# Patient Record
Sex: Female | Born: 1940 | Race: White | Hispanic: No | Marital: Married | Smoking: Never smoker
Health system: Southern US, Community
[De-identification: ages and names within clinical notes are randomized; demographics above are authoritative.]

## PROBLEM LIST (undated history)

## (undated) DIAGNOSIS — F410 Panic disorder [episodic paroxysmal anxiety] without agoraphobia: Secondary | ICD-10-CM

## (undated) DIAGNOSIS — K5901 Slow transit constipation: Secondary | ICD-10-CM

## (undated) DIAGNOSIS — G5600 Carpal tunnel syndrome, unspecified upper limb: Secondary | ICD-10-CM

## (undated) DIAGNOSIS — E785 Hyperlipidemia, unspecified: Secondary | ICD-10-CM

## (undated) DIAGNOSIS — K589 Irritable bowel syndrome without diarrhea: Secondary | ICD-10-CM

## (undated) DIAGNOSIS — K529 Noninfective gastroenteritis and colitis, unspecified: Secondary | ICD-10-CM

## (undated) DIAGNOSIS — H25019 Cortical age-related cataract, unspecified eye: Secondary | ICD-10-CM

## (undated) DIAGNOSIS — Q438 Other specified congenital malformations of intestine: Secondary | ICD-10-CM

## (undated) DIAGNOSIS — Z8719 Personal history of other diseases of the digestive system: Secondary | ICD-10-CM

## (undated) DIAGNOSIS — T8859XA Other complications of anesthesia, initial encounter: Secondary | ICD-10-CM

## (undated) DIAGNOSIS — F32A Depression, unspecified: Secondary | ICD-10-CM

## (undated) DIAGNOSIS — G629 Polyneuropathy, unspecified: Secondary | ICD-10-CM

## (undated) DIAGNOSIS — M810 Age-related osteoporosis without current pathological fracture: Secondary | ICD-10-CM

## (undated) DIAGNOSIS — L719 Rosacea, unspecified: Secondary | ICD-10-CM

## (undated) DIAGNOSIS — L409 Psoriasis, unspecified: Secondary | ICD-10-CM

## (undated) DIAGNOSIS — F4310 Post-traumatic stress disorder, unspecified: Secondary | ICD-10-CM

## (undated) DIAGNOSIS — H209 Unspecified iridocyclitis: Secondary | ICD-10-CM

## (undated) DIAGNOSIS — I10 Essential (primary) hypertension: Secondary | ICD-10-CM

## (undated) DIAGNOSIS — G4730 Sleep apnea, unspecified: Secondary | ICD-10-CM

## (undated) DIAGNOSIS — Z87898 Personal history of other specified conditions: Secondary | ICD-10-CM

## (undated) DIAGNOSIS — R1310 Dysphagia, unspecified: Secondary | ICD-10-CM

## (undated) DIAGNOSIS — F419 Anxiety disorder, unspecified: Secondary | ICD-10-CM

## (undated) DIAGNOSIS — G4733 Obstructive sleep apnea (adult) (pediatric): Secondary | ICD-10-CM

## (undated) DIAGNOSIS — T4145XA Adverse effect of unspecified anesthetic, initial encounter: Secondary | ICD-10-CM

## (undated) DIAGNOSIS — K649 Unspecified hemorrhoids: Secondary | ICD-10-CM

## (undated) DIAGNOSIS — M479 Spondylosis, unspecified: Secondary | ICD-10-CM

## (undated) DIAGNOSIS — G9520 Unspecified cord compression: Secondary | ICD-10-CM

## (undated) DIAGNOSIS — R42 Dizziness and giddiness: Secondary | ICD-10-CM

## (undated) DIAGNOSIS — M1990 Unspecified osteoarthritis, unspecified site: Secondary | ICD-10-CM

## (undated) DIAGNOSIS — K219 Gastro-esophageal reflux disease without esophagitis: Secondary | ICD-10-CM

## (undated) DIAGNOSIS — E739 Lactose intolerance, unspecified: Secondary | ICD-10-CM

## (undated) DIAGNOSIS — G4752 REM sleep behavior disorder: Secondary | ICD-10-CM

## (undated) DIAGNOSIS — F429 Obsessive-compulsive disorder, unspecified: Secondary | ICD-10-CM

## (undated) DIAGNOSIS — F329 Major depressive disorder, single episode, unspecified: Secondary | ICD-10-CM

## (undated) DIAGNOSIS — M2020 Hallux rigidus, unspecified foot: Secondary | ICD-10-CM

## (undated) DIAGNOSIS — Z9889 Other specified postprocedural states: Secondary | ICD-10-CM

## (undated) DIAGNOSIS — I779 Disorder of arteries and arterioles, unspecified: Secondary | ICD-10-CM

## (undated) DIAGNOSIS — B019 Varicella without complication: Secondary | ICD-10-CM

## (undated) DIAGNOSIS — G43909 Migraine, unspecified, not intractable, without status migrainosus: Secondary | ICD-10-CM

## (undated) DIAGNOSIS — D649 Anemia, unspecified: Secondary | ICD-10-CM

## (undated) DIAGNOSIS — R112 Nausea with vomiting, unspecified: Secondary | ICD-10-CM

## (undated) DIAGNOSIS — M47812 Spondylosis without myelopathy or radiculopathy, cervical region: Secondary | ICD-10-CM

## (undated) DIAGNOSIS — G479 Sleep disorder, unspecified: Secondary | ICD-10-CM

## (undated) HISTORY — PX: COLONOSCOPY: SHX174

## (undated) HISTORY — DX: Depression, unspecified: F32.A

## (undated) HISTORY — PX: ESOPHAGOGASTRODUODENOSCOPY: SHX1529

## (undated) HISTORY — PX: EYE SURGERY: SHX253

## (undated) HISTORY — PX: FOOT FUSION: SHX956

## (undated) HISTORY — DX: Noninfective gastroenteritis and colitis, unspecified: K52.9

## (undated) HISTORY — DX: Unspecified cord compression: G95.20

## (undated) HISTORY — PX: TONSILLECTOMY: SUR1361

## (undated) HISTORY — DX: Anxiety disorder, unspecified: F41.9

## (undated) HISTORY — PX: SPINE SURGERY: SHX786

## (undated) HISTORY — PX: BACK SURGERY: SHX140

## (undated) HISTORY — DX: Major depressive disorder, single episode, unspecified: F32.9

## (undated) HISTORY — PX: HAND SURGERY: SHX662

## (undated) HISTORY — PX: ABDOMINAL HYSTERECTOMY: SHX81

---

## 2000-08-07 DIAGNOSIS — I639 Cerebral infarction, unspecified: Secondary | ICD-10-CM

## 2000-08-07 HISTORY — DX: Cerebral infarction, unspecified: I63.9

## 2005-11-07 HISTORY — PX: OTHER SURGICAL HISTORY: SHX169

## 2006-05-18 ENCOUNTER — Ambulatory Visit: Payer: Self-pay | Admitting: Family Medicine

## 2006-09-22 ENCOUNTER — Ambulatory Visit: Payer: Self-pay | Admitting: Ophthalmology

## 2007-05-23 ENCOUNTER — Ambulatory Visit: Payer: Self-pay | Admitting: Family Medicine

## 2007-06-08 ENCOUNTER — Ambulatory Visit: Payer: Self-pay | Admitting: Unknown Physician Specialty

## 2007-09-12 ENCOUNTER — Ambulatory Visit: Payer: Self-pay | Admitting: Rheumatology

## 2007-09-20 ENCOUNTER — Ambulatory Visit: Payer: Self-pay | Admitting: Rheumatology

## 2007-10-15 ENCOUNTER — Ambulatory Visit: Payer: Self-pay | Admitting: Pain Medicine

## 2007-10-24 ENCOUNTER — Ambulatory Visit: Payer: Self-pay | Admitting: Pain Medicine

## 2007-12-04 ENCOUNTER — Ambulatory Visit: Payer: Self-pay | Admitting: Pain Medicine

## 2007-12-10 ENCOUNTER — Ambulatory Visit: Payer: Self-pay | Admitting: Pain Medicine

## 2008-01-08 ENCOUNTER — Ambulatory Visit: Payer: Self-pay | Admitting: Pain Medicine

## 2008-06-18 ENCOUNTER — Ambulatory Visit: Payer: Self-pay | Admitting: Family Medicine

## 2008-11-20 ENCOUNTER — Ambulatory Visit: Payer: Self-pay | Admitting: Unknown Physician Specialty

## 2008-11-26 ENCOUNTER — Ambulatory Visit: Payer: Self-pay | Admitting: Unknown Physician Specialty

## 2009-06-23 ENCOUNTER — Ambulatory Visit: Payer: Self-pay | Admitting: Family Medicine

## 2009-10-07 ENCOUNTER — Ambulatory Visit: Payer: Self-pay | Admitting: Gastroenterology

## 2009-12-16 ENCOUNTER — Ambulatory Visit: Payer: Self-pay | Admitting: Ophthalmology

## 2009-12-29 ENCOUNTER — Ambulatory Visit: Payer: Self-pay | Admitting: Ophthalmology

## 2010-08-12 ENCOUNTER — Ambulatory Visit: Payer: Self-pay | Admitting: Family Medicine

## 2010-11-09 DIAGNOSIS — R413 Other amnesia: Secondary | ICD-10-CM | POA: Insufficient documentation

## 2011-05-25 ENCOUNTER — Ambulatory Visit: Payer: Self-pay | Admitting: Urology

## 2011-11-28 ENCOUNTER — Ambulatory Visit: Payer: Self-pay | Admitting: Family Medicine

## 2012-05-30 ENCOUNTER — Ambulatory Visit: Payer: Self-pay | Admitting: Urology

## 2012-09-28 ENCOUNTER — Ambulatory Visit: Payer: Self-pay | Admitting: Neurology

## 2012-09-28 LAB — CREATININE, SERUM
EGFR (African American): >60
EGFR (Non-African Amer.): >60

## 2012-11-28 DIAGNOSIS — G9520 Unspecified cord compression: Secondary | ICD-10-CM | POA: Insufficient documentation

## 2012-11-29 ENCOUNTER — Ambulatory Visit: Payer: Self-pay | Admitting: Family Medicine

## 2013-01-28 ENCOUNTER — Ambulatory Visit: Payer: Self-pay | Admitting: Unknown Physician Specialty

## 2013-05-27 ENCOUNTER — Ambulatory Visit: Payer: Self-pay | Admitting: Urology

## 2013-06-28 DIAGNOSIS — R350 Frequency of micturition: Secondary | ICD-10-CM | POA: Insufficient documentation

## 2013-06-28 DIAGNOSIS — N952 Postmenopausal atrophic vaginitis: Secondary | ICD-10-CM | POA: Insufficient documentation

## 2013-06-28 DIAGNOSIS — N399 Disorder of urinary system, unspecified: Secondary | ICD-10-CM | POA: Insufficient documentation

## 2013-06-28 DIAGNOSIS — N3946 Mixed incontinence: Secondary | ICD-10-CM | POA: Insufficient documentation

## 2013-06-28 DIAGNOSIS — D419 Neoplasm of uncertain behavior of unspecified urinary organ: Secondary | ICD-10-CM | POA: Insufficient documentation

## 2013-12-20 ENCOUNTER — Ambulatory Visit: Payer: Self-pay | Admitting: Family Medicine

## 2014-03-04 ENCOUNTER — Ambulatory Visit: Payer: Self-pay | Admitting: Neurology

## 2014-03-09 ENCOUNTER — Emergency Department: Payer: Self-pay | Admitting: Emergency Medicine

## 2014-03-09 LAB — BASIC METABOLIC PANEL
Anion Gap: 9 (ref 7–16)
BUN: 9 mg/dL (ref 7–18)
CHLORIDE: 96 mmol/L — AB (ref 98–107)
CO2: 26 mmol/L (ref 21–32)
Calcium, Total: 9 mg/dL (ref 8.5–10.1)
Creatinine: 0.54 mg/dL — ABNORMAL LOW (ref 0.60–1.30)
GLUCOSE: 115 mg/dL — AB (ref 65–99)
Osmolality: 262 (ref 275–301)
POTASSIUM: 3.5 mmol/L (ref 3.5–5.1)
Sodium: 131 mmol/L — ABNORMAL LOW (ref 136–145)

## 2014-03-09 LAB — CBC
HCT: 32.3 % — ABNORMAL LOW (ref 35.0–47.0)
HGB: 11.1 g/dL — ABNORMAL LOW (ref 12.0–16.0)
MCH: 30.9 pg (ref 26.0–34.0)
MCHC: 34.3 g/dL (ref 32.0–36.0)
MCV: 90 fL (ref 80–100)
Platelet: 202 10*3/uL (ref 150–440)
RBC: 3.58 10*6/uL — ABNORMAL LOW (ref 3.80–5.20)
RDW: 14.3 % (ref 11.5–14.5)
WBC: 8.8 10*3/uL (ref 3.6–11.0)

## 2014-03-14 DIAGNOSIS — R519 Headache, unspecified: Secondary | ICD-10-CM | POA: Insufficient documentation

## 2014-03-14 DIAGNOSIS — E785 Hyperlipidemia, unspecified: Secondary | ICD-10-CM | POA: Insufficient documentation

## 2014-05-14 DIAGNOSIS — M810 Age-related osteoporosis without current pathological fracture: Secondary | ICD-10-CM | POA: Insufficient documentation

## 2014-05-14 DIAGNOSIS — F32A Depression, unspecified: Secondary | ICD-10-CM | POA: Insufficient documentation

## 2014-05-14 DIAGNOSIS — F419 Anxiety disorder, unspecified: Secondary | ICD-10-CM | POA: Insufficient documentation

## 2014-05-14 DIAGNOSIS — F332 Major depressive disorder, recurrent severe without psychotic features: Secondary | ICD-10-CM | POA: Insufficient documentation

## 2014-05-14 DIAGNOSIS — K219 Gastro-esophageal reflux disease without esophagitis: Secondary | ICD-10-CM | POA: Insufficient documentation

## 2014-07-25 DIAGNOSIS — M1990 Unspecified osteoarthritis, unspecified site: Secondary | ICD-10-CM | POA: Insufficient documentation

## 2014-10-14 ENCOUNTER — Inpatient Hospital Stay: Payer: Self-pay | Admitting: Internal Medicine

## 2014-10-14 LAB — COMPREHENSIVE METABOLIC PANEL
ALT: 21 U/L
ANION GAP: 6 — AB (ref 7–16)
AST: 30 U/L (ref 15–37)
Albumin: 3.7 g/dL (ref 3.4–5.0)
Alkaline Phosphatase: 69 U/L
BILIRUBIN TOTAL: 1.1 mg/dL — AB (ref 0.2–1.0)
BUN: 8 mg/dL (ref 7–18)
CO2: 28 mmol/L (ref 21–32)
Calcium, Total: 8.6 mg/dL (ref 8.5–10.1)
Chloride: 99 mmol/L (ref 98–107)
Creatinine: 0.66 mg/dL (ref 0.60–1.30)
Glucose: 108 mg/dL — ABNORMAL HIGH (ref 65–99)
OSMOLALITY: 265 (ref 275–301)
POTASSIUM: 3.6 mmol/L (ref 3.5–5.1)
SODIUM: 133 mmol/L — AB (ref 136–145)
TOTAL PROTEIN: 6.8 g/dL (ref 6.4–8.2)

## 2014-10-14 LAB — CBC WITH DIFFERENTIAL/PLATELET
Basophil #: 0.1 10*3/uL (ref 0.0–0.1)
Basophil %: 0.6 %
EOS ABS: 0.1 10*3/uL (ref 0.0–0.7)
Eosinophil %: 0.4 %
HCT: 34 % — ABNORMAL LOW (ref 35.0–47.0)
HGB: 11.6 g/dL — AB (ref 12.0–16.0)
LYMPHS ABS: 2.1 10*3/uL (ref 1.0–3.6)
Lymphocyte %: 11.7 %
MCH: 30.7 pg (ref 26.0–34.0)
MCHC: 34 g/dL (ref 32.0–36.0)
MCV: 90 fL (ref 80–100)
MONOS PCT: 5.7 %
Monocyte #: 1 x10 3/mm — ABNORMAL HIGH (ref 0.2–0.9)
NEUTROS PCT: 81.6 %
Neutrophil #: 14.8 10*3/uL — ABNORMAL HIGH (ref 1.4–6.5)
PLATELETS: 214 10*3/uL (ref 150–440)
RBC: 3.77 10*6/uL — AB (ref 3.80–5.20)
RDW: 13.7 % (ref 11.5–14.5)
WBC: 18.2 10*3/uL — ABNORMAL HIGH (ref 3.6–11.0)

## 2014-10-14 LAB — URINALYSIS, COMPLETE
BILIRUBIN, UR: NEGATIVE
Blood: NEGATIVE
Glucose,UR: NEGATIVE mg/dL (ref 0–75)
Nitrite: NEGATIVE
Ph: 6 (ref 4.5–8.0)
Protein: 100
Specific Gravity: 1.023 (ref 1.003–1.030)
Squamous Epithelial: 3
Transitional Epi: 1
WBC UR: 18 /HPF (ref 0–5)

## 2014-10-14 LAB — LIPASE, BLOOD: Lipase: 91 U/L (ref 73–393)

## 2014-10-14 LAB — TROPONIN I

## 2014-10-15 LAB — BASIC METABOLIC PANEL
ANION GAP: 7 (ref 7–16)
BUN: 4 mg/dL — AB (ref 7–18)
Calcium, Total: 8 mg/dL — ABNORMAL LOW (ref 8.5–10.1)
Chloride: 106 mmol/L (ref 98–107)
Co2: 26 mmol/L (ref 21–32)
Creatinine: 0.69 mg/dL (ref 0.60–1.30)
Glucose: 99 mg/dL (ref 65–99)
Osmolality: 274 (ref 275–301)
POTASSIUM: 3.3 mmol/L — AB (ref 3.5–5.1)
Sodium: 139 mmol/L (ref 136–145)

## 2014-10-15 LAB — CBC WITH DIFFERENTIAL/PLATELET
BASOS ABS: 0.1 10*3/uL (ref 0.0–0.1)
Basophil %: 0.7 %
Eosinophil #: 0.2 10*3/uL (ref 0.0–0.7)
Eosinophil %: 1.9 %
HCT: 30.7 % — ABNORMAL LOW (ref 35.0–47.0)
HGB: 10.5 g/dL — AB (ref 12.0–16.0)
Lymphocyte #: 2 10*3/uL (ref 1.0–3.6)
Lymphocyte %: 16.6 %
MCH: 30.8 pg (ref 26.0–34.0)
MCHC: 34.3 g/dL (ref 32.0–36.0)
MCV: 90 fL (ref 80–100)
MONOS PCT: 8 %
Monocyte #: 1 x10 3/mm — ABNORMAL HIGH (ref 0.2–0.9)
NEUTROS ABS: 8.8 10*3/uL — AB (ref 1.4–6.5)
NEUTROS PCT: 72.8 %
PLATELETS: 188 10*3/uL (ref 150–440)
RBC: 3.42 10*6/uL — ABNORMAL LOW (ref 3.80–5.20)
RDW: 13.8 % (ref 11.5–14.5)
WBC: 12.1 10*3/uL — ABNORMAL HIGH (ref 3.6–11.0)

## 2014-10-15 LAB — CLOSTRIDIUM DIFFICILE(ARMC)

## 2014-10-16 LAB — URINE CULTURE

## 2014-10-17 ENCOUNTER — Ambulatory Visit: Payer: Self-pay | Admitting: Podiatry

## 2014-10-17 LAB — STOOL CULTURE

## 2014-10-19 LAB — CULTURE, BLOOD (SINGLE)

## 2014-10-29 ENCOUNTER — Encounter: Payer: Self-pay | Admitting: *Deleted

## 2014-10-29 ENCOUNTER — Ambulatory Visit (INDEPENDENT_AMBULATORY_CARE_PROVIDER_SITE_OTHER): Payer: Medicare Other | Admitting: Podiatry

## 2014-10-29 ENCOUNTER — Other Ambulatory Visit: Payer: Self-pay | Admitting: *Deleted

## 2014-10-29 ENCOUNTER — Encounter: Payer: Self-pay | Admitting: Podiatry

## 2014-10-29 VITALS — BP 108/63 | HR 89 | Resp 16 | Ht <= 58 in | Wt 130.0 lb

## 2014-10-29 DIAGNOSIS — B351 Tinea unguium: Secondary | ICD-10-CM

## 2014-10-29 DIAGNOSIS — M79606 Pain in leg, unspecified: Secondary | ICD-10-CM

## 2014-10-29 NOTE — Progress Notes (Signed)
   Subjective:    Patient ID: Sandra Mcconnell, female    DOB: 1941/05/15, 73 y.o.   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  Want to have my feet looked at .      Review of Systems  All other systems reviewed and are negative.      Objective:   Physical Exam: I have reviewed her past history medications out a surgery social history and review of systems area pulses are strongly palpable bilateral. Neurologic sensorium is intact percent once the monofilament. Deep tendon reflexes are intact bilateral muscle strength is 5 over 5 dorsiflexion plantar flexors and inverters and everters all intrinsic musculature is intact. Orthopedic evaluation shows all joints distal to the ankle for range of motion without crepitation with exception of the first metatarsophalangeal joints bilaterally which were fused back in 2004. Her nails are thick yellow dystrophic I do not see any signs of psoriasis. Nail dystrophy or onychomycosis would be the treatment. These are tender on palpation as well as debridement.        Assessment & Plan:  Assessment: Pain limb secondary to onychomycosis and nail dystrophy is bilateral.  Plan: Debridement of nails 1 through 5 bilateral cover service secondary to discomfort.

## 2014-12-22 ENCOUNTER — Ambulatory Visit: Payer: Self-pay | Admitting: Family Medicine

## 2015-01-19 ENCOUNTER — Ambulatory Visit: Payer: Medicare Other

## 2015-01-21 ENCOUNTER — Ambulatory Visit (INDEPENDENT_AMBULATORY_CARE_PROVIDER_SITE_OTHER): Payer: Medicare Other | Admitting: Podiatry

## 2015-01-21 DIAGNOSIS — B351 Tinea unguium: Secondary | ICD-10-CM

## 2015-01-21 DIAGNOSIS — M79606 Pain in leg, unspecified: Secondary | ICD-10-CM

## 2015-01-21 NOTE — Progress Notes (Signed)
She presents with a chief complaint of painful elongated toenails bilateral.  Objective: His are strongly palpable bilateral. Nails are thick yellow dystrophic with mycotic and painful palpation.  Assessment: Pain in limb secondary to onychomycosis 1 through 5 bilateral.  Plan: Debridement of nails 1 through 5 bilateral.

## 2015-02-20 DIAGNOSIS — G2581 Restless legs syndrome: Secondary | ICD-10-CM | POA: Insufficient documentation

## 2015-02-28 NOTE — H&P (Signed)
PATIENT NAME:  Sandra Mcconnell, Sandra Mcconnell MR#:  [id number] DATE OF BIRTH:  December 31, 1940  DATE OF ADMISSION:  10/14/2014  REFERRING EMERGENCY ROOM PHYSICIAN:  Dr. Benjaman Lobe.   PRIMARY CARE PHYSICIAN: Dr. Lovie Macadamia.    PRIMARY GASTROENTEROLOGIST:  Dr. Vira Agar.   CHIEF COMPLAINT: Abdominal pain and diarrhea.   HISTORY OF PRESENT ILLNESS: This very pleasant 74 year old female with past medical history of anxiety with panic, REM sleep disorder, recent right arm surgery and trigger finger release, presents today with 3 days of intense abdominal pain, chills, sweats, diarrhea, and vomiting. She reports that on Saturday she had acute onset of abdominal pain which felt like something twisting deep in her abdomen. Pain is 10 out of 10 at its worst. She then began to have profound watery diarrhea, no hematochezia or melena. She began vomiting repeatedly to the point of dry heaving. She was unable to sleep since that time. She reports that any ingestion of any food or fluid caused recurrence of the abdominal pain. This morning she decided she could not take it anymore and she was unable to hold anything down without vomiting and pain, so presented to the Emergency Room. CT scan shows colitis of the right and proximal transverse colon. She is admitted for further vital admission and treatment.   PAST MEDICAL HISTORY:  1.  Anxiety with panic.  2.  REM sleep disorder.  3.  Restless leg syndrome.  4.  Migraines.  5.  Gastroesophageal reflux disease.  6.  Poor balance requiring use of Mcconnell walker.  7.  Multiple C-spine surgeries requiring cervical brace (soft).     PAST SURGICAL HISTORY:  1.  Recent right arm trigger finger and carpal tunnel release surgery.  2.  Tonsillectomy.  3.  Hysterectomy.  4.  Cervical spine fusion x 2.   SOCIAL HISTORY: The patient lives with her husband at [facility]. She uses Mcconnell walker due to poor balance. She denies alcohol or illicit substance abuse. Does not smoke cigarettes.   FAMILY MEDICAL  HISTORY: Positive for coronary artery disease, stroke, diabetes in her mother and father. Mother also had breast cancer. Grandfather had colon cancer.   ALLERGIES: THE PATIENT IS ALLERGIC TO ABILIFY, REMERON, CIPROFLOXACIN, AND PENICILLIN.   HOME MEDICATIONS:  1. Zofran ODT 4 mg 1 tablet 3 times Mcconnell day as needed for nausea and vomiting.  2. Trazodone 50 mg 1 tablet 3 times Mcconnell day as needed for anxiety.  3. Tizanidine 4 mg 2 capsules 3 times Mcconnell day.  4. Sertraline 100 mg 1 tablet daily.  5. Ropinirole 0.5 mg 2 tablets once Mcconnell day at bedtime.  6. Raloxifene 60 mg 1 tablet once Mcconnell day.  7. Polyethylene glycol 17 grams 1-2 times Mcconnell day as needed for constipation.  8. Omeprazole 20 mg 1 tablet once Mcconnell day.  9. Norco 325-5 mg 1 tablet every 6 hours as needed for pain.  10. Ipratropium nasal spray 2 sprays to each nostril 3 times Mcconnell day.  11. Fortical 200 international units inhalation 1 spray nasally once Mcconnell day.  12. Fluticasone propionate 50 mcg nasally once Mcconnell day.  13. Etodolac 400 mg 1 tablet twice Mcconnell day.  14. Clonazepam 1 mg 1.5 tablets once Mcconnell day at bedtime.  15. Betamethasone topical 0.05% topical ointment apply twice Mcconnell day.   REVIEW OF SYSTEMS:  CONSTITUTIONAL: Positive for subjective fevers, chills, weakness, and fatigue, as well as pain.  HEENT: No change in vision or hearing, no pain in eyes or ears, no  difficulty swallowing or sore throat.  RESPIRATORY: No cough, wheezing, shortness of breath, painful respirations.  CARDIOVASCULAR: No chest pain, orthopnea, edema, palpitations.  GASTROINTESTINAL: Positive as above for nausea, vomiting, diarrhea, and abdominal pain. No hematemesis or melena.  GENITOURINARY: Negative for dysuria or frequency.  MUSCULOSKELETAL: No joint effusions, limited range of motion, no new pain in the neck, back, shoulders, knees, or hips.  NEUROLOGIC: No focal numbness or weakness, no CVA, seizure, or dementia, does have Mcconnell history of sleep disorder, headaches, and  restless leg syndrome.  PSYCHIATRIC: Positive for anxiety and depression which seem controlled at this time.   PHYSICAL EXAMINATION:  VITAL SIGNS: Temperature 98.2, pulse 82, respirations 18, blood pressure 130/66, oxygenation 100% on room air.  GENERAL: No acute distress.  HEENT: Pupils equal, round, and reactive to light, conjunctivae clear, extraocular motion intact, oral mucous membranes are pink and moist, she has upper and lower dentures, she does have Mcconnell shallow aphthous type ulcer on the right posterior palate, no other oral lesions, posterior oropharynx is clear with no exudate, edema, or erythema, trachea is midline, no cervical lymphadenopathy, thyroid nontender.   PULMONARY: Lungs clear to auscultation bilaterally with good air movement.  CARDIOVASCULAR: Regular rate and rhythm, no murmurs, rubs, or gallops, no peripheral edema, peripheral pulses are 2 +.  ABDOMEN: Distended, soft, tender diffusely but worse in the right mid and upper quadrants, there is guarding, no rebound, no organomegaly.  MUSCULOSKELETAL: No joint effusions, she does have Mcconnell cast on the right arm from the elbow to the mid fingers, she also wears Mcconnell soft neck collar. Range of motion normal, strength is 5 out of 5 throughout.  NEUROLOGIC: Cranial nerves II through XII grossly intact. Strength and sensation intact, nonfocal.  PSYCHIATRIC: The patient does have Mcconnell history of severe anxiety, depression, and sleep disorder, but these seem to be controlled at this time.   LABORATORY DATA: Sodium 133, potassium 3.6, chloride 99, bicarbonate 28, BUN 8, creatinine 0.66, glucose 108. LFTs normal. White blood cells 18.2, hemoglobin 11.6, platelets 214,000, MCV 90.  UA positive with 18 white blood cells, 3 red blood cells.   IMAGING: CT scan of the abdomen shows severe wall thickening of the right and proximal transverse colon noted consistent with colitis. Most likely it is inflammatory or infectious in origin, but ischemic colitis  cannot be excluded. No pneumatosis is seen.  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   ASSESSMENT AND PLAN:   1.  Colitis: The patient reports that she has Mcconnell long history of GI issues though these are not well defined. Her last colonoscopy was in March of 2004 by Dr. Vira Agar. At that time she had hemorrhoids, no other findings. Differential at this point include infectious, inflammatory, or possibly ischemic colitis. We will check Clostridium difficile as she has received antibiotics prior to her surgery. We will check stool cultures. Flagyl and Bactrim IV have been started in the Emergency Room (she is allergic to penicillin and ciprofloxacin). She does not have any hematochezia or melena. If symptoms are not significantly improved in the morning after antibiotics would get Mcconnell GI consult by Dr. Percell [place] group for further evaluation.  2.  Urinary tract infection: We will send urine for culture. IV Bactrim would cover most urinary tract pathogens.  3.  Sleep disorder: The patient reports REM sleep disorder with behavioral disturbance. Her husband will spend the night with her tonight in the hospital to help  with this. She will resume her home medications at this time.  4.  Anxiety disorder with panic: The patient seems calm at this time. Will resume home medications.  5.  Recent right hand surgery: We will have pain medication available. At this time pain is controlled. She has good range of motion of her fingers. Seems to be healing nicely. 6.  Gastroesophageal reflux disease: Continue PPI.  7.  Prophylaxis: Will hold off on pharmacological DVT prophylaxis in the setting of acute colitis as do not want to encourage any bleeding. Will have PPI for GI prophylaxis.   TIME SPENT ON ADMISSION: 45 minutes.    ____________________________ Earleen Newport. Volanda Napoleon, MD cpw:bu D: 10/14/2014 19:16:00 ET T: 10/14/2014 19:55:59  ET JOB#: [id number]  cc: Barnetta Chapel P. Volanda Napoleon, MD, <Dictator> Aldean Jewett MD ELECTRONICALLY SIGNED 10/29/2014 9:07

## 2015-02-28 NOTE — Discharge Summary (Signed)
PATIENT NAME:  Sandra Mcconnell, Sandra Mcconnell MR#:  [id number] DATE OF BIRTH:  08-Apr-1941  DATE OF ADMISSION:  10/14/2014 DATE OF DISCHARGE:  10/16/2014  DISCHARGE DIAGNOSES:  1.  Acute transverse colitis.  2.  Gastroesophageal reflux disease.  3.  Chronic dysphagia.  4.  Irritable bowel syndrome.   DISCHARGE MEDICATIONS:  1.  Omeprazole 20 mg daily.  2.  Fortical 200 international units nasal spray once Mcconnell day.  3.  Fluticasone nasal spray once.  4.  MiraLax 17 grams 1 to 2 times Mcconnell day as needed for constipation.  5.  Ipratropium 2 sprays nasal 3 times Mcconnell day as needed.  6.  Norco 325/5 one tablet oral every 6 hours as needed for pain.  7.  Raloxifene 60 mg oral once Mcconnell day.  8.  Tizanidine 4 mg 2 capsules oral 3 times Mcconnell day.  9.  Clonazepam 1.5 tablets oral once Mcconnell day.  10. Ropinirole 0.5 mg 2 tablets once Mcconnell day at bedtime.  11. Etodolac 400 mg oral 2 times Mcconnell day as needed for headache.  12. Sertraline 100 mg daily.  13. Trazodone 50 mg oral 3 times Mcconnell day as needed for anxiety and nervousness.  14. Zofran ODT 4 mg oral 3 times Mcconnell day as needed.  15. Flagyl 500 mg oral every 8 hours.  16. Bactrim DS 1 tablet oral 2 times Mcconnell day.   DISCHARGE INSTRUCTIONS: Mechanical soft diet. Activity as tolerated. Follow up with Dr. Vira Agar of GI in 2 to 4 weeks for colitis and dysphagia.   IMAGING STUDIES: Include Mcconnell CT scan of the abdomen and pelvis with contrast which showed severe wall thickening of the right and proximal transverse colon with colitis. No pneumatosis is seen. No abscess or diverticulitis.   ADMITTING HISTORY AND PHYSICAL AND HOSPITAL COURSE: Please see the detailed H and P dictated by Dr. Volanda Napoleon. This is Mcconnell 18-old Caucasian female patient who was brought in to the hospital complaining of abdominal pain, diarrhea, nausea and vomiting. The patient was found to have colitis and was admitted to the hospitalist service. The patient was started on antibiotics of Flagyl and Bactrim secondary to her allergy  profile. The patient improved well by the day of discharge. The abdominal pain is much improved. She has tolerated regular food. No nausea or vomiting. No blood in the stool. Afebrile. White count has trended down close to normal, and she is being discharged home on antibiotics to finish Mcconnell total of 10-day course. The patient will follow up with Dr. Vira Agar for her colitis and also her chronic dysphagia.   PHYSICAL EXAMINATION:  ABDOMEN:  Prior to discharge, the patient has mild tenderness in the lower abdomen. No rigidity or guarding. Bowel sounds present.  LUNGS: Sound clear.  HEART: S1, S2 heard.   TIME SPENT ON DAY OF DISCHARGE IN DISCHARGE ACTIVITY: 35 minutes.    ____________________________ Leia Alf Devin Ganaway, MD srs:JT D: 10/17/2014 12:37:00 ET T: 10/17/2014 20:46:04 ET JOB#: [id number]  cc: Alveta Heimlich R. Darvin Neighbours, MD, <Dictator> Manya Silvas, MD Youlanda Roys. Lovie Macadamia, MD Neita Carp MD ELECTRONICALLY SIGNED 10/23/2014 13:08

## 2015-04-27 ENCOUNTER — Ambulatory Visit: Payer: Medicare Other

## 2015-09-06 ENCOUNTER — Emergency Department
Admission: EM | Admit: 2015-09-06 | Discharge: 2015-09-06 | Disposition: A | Discharge status: Home / Self Care | Payer: Medicare Other | Attending: Emergency Medicine | Admitting: Emergency Medicine

## 2015-09-06 ENCOUNTER — Emergency Department: Payer: Medicare Other

## 2015-09-06 ENCOUNTER — Encounter: Payer: Self-pay | Admitting: Radiology

## 2015-09-06 DIAGNOSIS — Z79899 Other long term (current) drug therapy: Secondary | ICD-10-CM | POA: Insufficient documentation

## 2015-09-06 DIAGNOSIS — Z880 Allergy status to penicillin: Secondary | ICD-10-CM | POA: Diagnosis not present

## 2015-09-06 DIAGNOSIS — R1031 Right lower quadrant pain: Secondary | ICD-10-CM | POA: Insufficient documentation

## 2015-09-06 DIAGNOSIS — M545 Low back pain, unspecified: Secondary | ICD-10-CM

## 2015-09-06 DIAGNOSIS — R200 Anesthesia of skin: Secondary | ICD-10-CM | POA: Insufficient documentation

## 2015-09-06 DIAGNOSIS — G8929 Other chronic pain: Secondary | ICD-10-CM | POA: Insufficient documentation

## 2015-09-06 DIAGNOSIS — R531 Weakness: Secondary | ICD-10-CM | POA: Insufficient documentation

## 2015-09-06 LAB — URINALYSIS COMPLETE WITH MICROSCOPIC (ARMC ONLY)
Bacteria, UA: NONE SEEN
Bilirubin Urine: NEGATIVE
Glucose, UA: NEGATIVE mg/dL
Hgb urine dipstick: NEGATIVE
Ketones, ur: NEGATIVE mg/dL
Leukocytes, UA: NEGATIVE
Nitrite: NEGATIVE
Protein, ur: NEGATIVE mg/dL
RBC / HPF: NONE SEEN RBC/hpf (ref 0–5)
Specific Gravity, Urine: 1.02 (ref 1.005–1.030)
pH: 5 (ref 5.0–8.0)

## 2015-09-06 LAB — COMPREHENSIVE METABOLIC PANEL WITH GFR
ALT: 14 U/L (ref 14–54)
AST: 23 U/L (ref 15–41)
Albumin: 4 g/dL (ref 3.5–5.0)
Alkaline Phosphatase: 56 U/L (ref 38–126)
Anion gap: 6 (ref 5–15)
BUN: 14 mg/dL (ref 6–20)
CO2: 31 mmol/L (ref 22–32)
Calcium: 8.9 mg/dL (ref 8.9–10.3)
Chloride: 102 mmol/L (ref 101–111)
Creatinine, Ser: 0.68 mg/dL (ref 0.44–1.00)
GFR calc Af Amer: >60 mL/min (ref >60)
GFR calc non Af Amer: >60 mL/min (ref >60)
Glucose, Bld: 100 mg/dL — ABNORMAL HIGH (ref 65–99)
Potassium: 4.1 mmol/L (ref 3.5–5.1)
Sodium: 139 mmol/L (ref 135–145)
Total Bilirubin: 0.6 mg/dL (ref 0.3–1.2)
Total Protein: 6.5 g/dL (ref 6.5–8.1)

## 2015-09-06 LAB — CBC WITH DIFFERENTIAL/PLATELET
Basophils Absolute: 0 K/uL (ref 0–0.1)
Basophils Relative: 0 %
Eosinophils Absolute: 0.1 K/uL (ref 0–0.7)
Eosinophils Relative: 1 %
HCT: 30.4 % — ABNORMAL LOW (ref 35.0–47.0)
Hemoglobin: 10.6 g/dL — ABNORMAL LOW (ref 12.0–16.0)
Lymphocytes Relative: 17 %
Lymphs Abs: 1.6 K/uL (ref 1.0–3.6)
MCH: 31.8 pg (ref 26.0–34.0)
MCHC: 34.8 g/dL (ref 32.0–36.0)
MCV: 91.4 fL (ref 80.0–100.0)
Monocytes Absolute: 1 K/uL — ABNORMAL HIGH (ref 0.2–0.9)
Monocytes Relative: 10 %
Neutro Abs: 6.7 K/uL — ABNORMAL HIGH (ref 1.4–6.5)
Neutrophils Relative %: 72 %
Platelets: 180 K/uL (ref 150–440)
RBC: 3.32 MIL/uL — ABNORMAL LOW (ref 3.80–5.20)
RDW: 13.4 % (ref 11.5–14.5)
WBC: 9.4 K/uL (ref 3.6–11.0)

## 2015-09-06 LAB — LIPASE, BLOOD: Lipase: 23 U/L (ref 11–51)

## 2015-09-06 MED ORDER — IOHEXOL 240 MG/ML SOLN
25.0000 mL | Freq: Once | INTRAMUSCULAR | Status: AC | PRN
  Administered 2015-09-06: 25 mL via ORAL

## 2015-09-06 MED ORDER — MORPHINE SULFATE (PF) 2 MG/ML IV SOLN
2.0000 mg | Freq: Once | INTRAVENOUS | Status: AC
  Administered 2015-09-06: 2 mg via INTRAVENOUS
  Filled 2015-09-06: qty 1

## 2015-09-06 MED ORDER — IOHEXOL 300 MG/ML  SOLN
100.0000 mL | Freq: Once | INTRAMUSCULAR | Status: AC | PRN
  Administered 2015-09-06: 100 mL via INTRAVENOUS

## 2015-09-06 NOTE — ED Provider Notes (Signed)
[facility] Emergency Department Provider Note  ____________________________________________  Time seen: 0745  I have reviewed the triage vital signs and the nursing notes.   HISTORY  Chief Complaint Back Pain; Extremity Weakness; and Numbness   History limited by: Not Limited   HPI Sandra Mcconnell is a 74 y.o. female with history of chronic back pain issues who presents because of worsening right flank pain. She states that it started about 10 days ago. It gradually is getting worse until about 4 days ago when it became more severe. It is a constant pain however does have episodes of very intense pain. She states it's worse when she moves around however it is still present even lying still. She states the pain starts in her right back that wraps around down towards her right groin. She states that her home oxycodone is not helping with this pain. She denies any fevers. She states that her right leg has given out a couple of times. She states is referred to support weight on her leg given the pain. She denies any urinary retention. No recent bowel incontinence although does have a history of occasional episodes of bowel incontinence.   Past Medical History  Diagnosis Date  . Cervical spinal cord compression   . Depression   . Anxiety   . Colitis     Patient Active Problem List   Diagnosis Date Noted  . 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 Urinary system disease 06/28/2013  . 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 FOM (frequency of micturition) 06/28/2013  . Cervical spinal cord compression ([place]) 11/28/2012  . Amnesia 11/09/2010    Past Surgical History  Procedure Laterality Date  . Hand surgery    . Spine surgery      Current Outpatient Rx  Name  Route  Sig  Dispense  Refill  . betamethasone dipropionate (DIPROLENE) 0.05 % ointment            6   . calcitonin, salmon, (MIACALCIN/FORTICAL) 200  UNIT/ACT nasal spray            3   . chlorhexidine (PERIDEX) 0.12 % solution            99   . clonazePAM (KLONOPIN) 0.5 MG tablet            1   . etodolac (LODINE) 400 MG tablet            1   . fluticasone (FLONASE) 50 MCG/ACT nasal spray            5   . FLUZONE HIGH-DOSE 0.5 ML SUSY            0   . HYDROcodone-acetaminophen (NORCO/VICODIN) 5-325 MG per tablet            0   . ipratropium (ATROVENT) 0.03 % nasal spray            5   . metroNIDAZOLE (FLAGYL) 500 MG tablet   Oral   Take 500 mg by mouth every 8 (eight) hours.      0   . omeprazole (PRILOSEC) 20 MG capsule            2   . ondansetron (ZOFRAN) 4 MG tablet               . ondansetron (ZOFRAN-ODT) 4 MG disintegrating tablet            0   .  polyethylene glycol powder (GLYCOLAX/MIRALAX) powder            0   . raloxifene (EVISTA) 60 MG tablet   Oral   Take 60 mg by mouth daily.      1   . rOPINIRole (REQUIP) 0.5 MG tablet   Oral   Take 1 mg by mouth at bedtime.      5   . sertraline (ZOLOFT) 100 MG tablet            2   . sulfamethoxazole-trimethoprim (BACTRIM DS,SEPTRA DS) 800-160 MG per tablet   Oral   Take 1 tablet by mouth 2 (two) times daily.      0   . tiZANidine (ZANAFLEX) 4 MG tablet               . traZODone (DESYREL) 50 MG tablet            2     Allergies Aripiprazole; Ciprofloxacin; Mirtazapine; and Penicillins  No family history on file.  Social History Social History  Substance Use Topics  . Smoking status: Never Smoker   . Smokeless tobacco: Not on file  . Alcohol Use: Not on file    Review of Systems  Constitutional: Negative for fever. Cardiovascular: Negative for chest pain. Respiratory: Negative for shortness of breath. Gastrointestinal: Positive for right lower quadrant pain Genitourinary: Negative for dysuria. Musculoskeletal: Positive for lower right back pain Skin: Negative for  rash. Neurological: Negative for headaches, focal weakness or numbness.   10-point ROS otherwise negative.  ____________________________________________   PHYSICAL EXAM:  VITAL SIGNS: ED Triage Vitals  Enc Vitals Group     BP 09/06/15 0604 96/41 mmHg     Pulse Rate 09/06/15 0604 77     Resp 09/06/15 0604 18     Temp 09/06/15 0604 98.5 F (36.9 C)     Temp Source 09/06/15 0604 Oral     SpO2 09/06/15 0604 99 %     Weight 09/06/15 0604 138 lb (62.596 kg)     Height 09/06/15 0604 4\' 10"  (1.473 m)     Head Cir --      Peak Flow --      Pain Score 09/06/15 0605 8   Constitutional: Alert and oriented. Well appearing and in no distress. Eyes: Conjunctivae are normal. PERRL. Normal extraocular movements. ENT   Head: Normocephalic and atraumatic.   Nose: No congestion/rhinnorhea.   Mouth/Throat: Mucous membranes are moist.   Neck: No stridor. Hematological/Lymphatic/Immunilogical: No cervical lymphadenopathy. Cardiovascular: Normal rate, regular rhythm.  No murmurs, rubs, or gallops. Respiratory: Normal respiratory effort without tachypnea nor retractions. Breath sounds are clear and equal bilaterally. No wheezes/rales/rhonchi. Gastrointestinal: Soft. Tender to palpation in the right side and right lower quadrant. Mild right-sided CVA tenderness. Genitourinary: Deferred Musculoskeletal: Normal range of motion in all extremities. No joint effusions.  No lower extremity tenderness nor edema. Neurologic:  Normal speech and language. No gross focal neurologic deficits are appreciated. Speech is normal.  Skin:  Skin is warm, dry and intact. No rash noted. Psychiatric: Mood and affect are normal. Speech and behavior are normal. Patient exhibits appropriate insight and judgment.  ____________________________________________    LABS (pertinent positives/negatives)  Labs Reviewed  CBC WITH DIFFERENTIAL/PLATELET - Abnormal; Notable for the following:    RBC 3.32 (*)     Hemoglobin 10.6 (*)    HCT 30.4 (*)    Neutro Abs 6.7 (*)    Monocytes Absolute 1.0 (*)    All other components  within normal limits  COMPREHENSIVE METABOLIC PANEL - Abnormal; Notable for the following:    Glucose, Bld 100 (*)    All other components within normal limits  URINALYSIS COMPLETEWITH MICROSCOPIC ([facility] ONLY) - Abnormal; Notable for the following:    Color, Urine YELLOW (*)    APPearance CLEAR (*)    Squamous Epithelial / LPF 0-5 (*)    All other components within normal limits  LIPASE, BLOOD     ____________________________________________   EKG  None  ____________________________________________    RADIOLOGY  CT abd/pel  IMPRESSION: 1. No acute findings in the abdomen or pelvis. 2. 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 3. Stable angiomyolipoma of the RIGHT kidney. 4. Degenerative change of the lumbar spine stable  I, Shonique Pelphrey, personally viewed and evaluated these images (plain radiographs) as part of my medical decision making. ____________________________________________   PROCEDURES  Procedure(s) performed: None  Critical Care performed: No  ____________________________________________   INITIAL IMPRESSION / ASSESSMENT AND PLAN / ED COURSE  Pertinent labs & imaging results that were available during my care of the patient were reviewed by me and considered in my medical decision making (see chart for details).  She presents to the emergency department today with concerns for right flank and low back and abdominal pain. A CT scan was performed given that she did have some tenderness in that area. CT scan without any obvious etiology of these findings. This point patient does have good strength bilaterally in lower extremities as well as sensation. Additionally no urinary retention or bowel incontinence. I think spinal cord compression is unlikely at this point. I think more likely this is an exacerbation  of her chronic pain issues. She is not taking her full potential dose of the narcotic pain medications I did discuss with the family that she should try to take her oxycodone every 6 hours as it is prescribed. Additionally I encourage patient to follow-up with her pain management doctors.  ____________________________________________   FINAL CLINICAL IMPRESSION(S) / ED DIAGNOSES  Final diagnoses:  Right-sided low back pain without sciatica     Nance Pear, MD 09/06/15 1340

## 2015-09-06 NOTE — Discharge Instructions (Signed)
As we discussed please take your pain medication every 6 hours. Please seek medical attention for any high fevers, chest pain, shortness of breath, inability to urinate, bowel incontinence, change in behavior, persistent vomiting, bloody stool or any other new or concerning symptoms.   Back Pain, Adult Back pain is very common in adults.The cause of back pain is rarely dangerous and the pain often gets better over time.The cause of your back pain may not be known. Some common causes of back pain include:  Strain of the muscles or ligaments supporting the spine.  Wear and tear (degeneration) of the spinal disks.  Arthritis.  Direct injury to the back. For many people, back pain may return. Since back pain is rarely dangerous, most people can learn to manage this condition on their own. HOME CARE INSTRUCTIONS Watch your back pain for any changes. The following actions may help to lessen any discomfort you are feeling:  Remain active. It is stressful on your back to sit or stand in one place for long periods of time. Do not sit, drive, or stand in one place for more than 30 minutes at a time. Take short walks on even surfaces as soon as you are able.Try to increase the length of time you walk each day.  Exercise regularly as directed by your health care provider. Exercise helps your back heal faster. It also helps avoid future injury by keeping your muscles strong and flexible.  Do not stay in bed.Resting more than 1-2 days can delay your recovery.  Pay attention to your body when you bend and lift. The most comfortable positions are those that put less stress on your recovering back. Always use proper lifting techniques, including:  Bending your knees.  Keeping the load close to your body.  Avoiding twisting.  Find a comfortable position to sleep. Use a firm mattress and lie on your side with your knees slightly bent. If you lie on your back, put a pillow under your knees.  Avoid  feeling anxious or stressed.Stress increases muscle tension and can worsen back pain.It is important to recognize when you are anxious or stressed and learn ways to manage it, such as with exercise.  Take medicines only as directed by your health care provider. Over-the-counter medicines to reduce pain and inflammation are often the most helpful.Your health care provider may prescribe muscle relaxant drugs.These medicines help dull your pain so you can more quickly return to your normal activities and healthy exercise.  Apply ice to the injured area:  Put ice in a plastic bag.  Place a towel between your skin and the bag.  Leave the ice on for 20 minutes, 2-3 times a day for the first 2-3 days. After that, ice and heat may be alternated to reduce pain and spasms.  Maintain a healthy weight. Excess weight puts extra stress on your back and makes it difficult to maintain good posture. SEEK MEDICAL CARE IF:  You have pain that is not relieved with rest or medicine.  You have increasing pain going down into the legs or buttocks.  You have pain that does not improve in one week.  You have night pain.  You lose weight.  You have a fever or chills. SEEK IMMEDIATE MEDICAL CARE IF:   You develop new bowel or bladder control problems.  You have unusual weakness or numbness in your arms or legs.  You develop nausea or vomiting.  You develop abdominal pain.  You feel faint.   This  information is not intended to replace advice given to you by your health care provider. Make sure you discuss any questions you have with your health care provider.   Document Released: 10/24/2005 Document Revised: 11/14/2014 Document Reviewed: 02/25/2014 Elsevier Interactive Patient Education [employer].

## 2015-09-06 NOTE — ED Notes (Addendum)
Pt says she has a history of chronic back pain-pt at the pain clinic at [facility]; Pt c/o worsening back pain that has progressed to extremity weakness on the right side and numbness to right arm, right leg that starts at the right hip; husband says when he tried to get his wife up this am she cried out in pain; painful to touch that side of her arms; pt denies any recent falls

## 2015-09-06 NOTE — ED Notes (Signed)
Discussed discharge instructions and follow-up care with patient. No questions or concerns at this time. Pt stable at discharge.  

## 2015-09-06 NOTE — ED Notes (Signed)
Report given to Alicia, RN

## 2015-12-02 ENCOUNTER — Encounter: Payer: Self-pay | Admitting: *Deleted

## 2015-12-08 ENCOUNTER — Encounter
Admission: RE | Disposition: A | Discharge status: Home / Self Care | Payer: Self-pay | Source: Ambulatory Visit | Attending: Ophthalmology

## 2015-12-08 ENCOUNTER — Ambulatory Visit: Payer: Medicare Other | Admitting: Anesthesiology

## 2015-12-08 ENCOUNTER — Ambulatory Visit
Admission: RE | Admit: 2015-12-08 | Discharge: 2015-12-08 | Disposition: A | Discharge status: Home / Self Care | Payer: Medicare Other | Source: Ambulatory Visit | Attending: Ophthalmology | Admitting: Ophthalmology

## 2015-12-08 ENCOUNTER — Encounter: Payer: Self-pay | Admitting: *Deleted

## 2015-12-08 DIAGNOSIS — R002 Palpitations: Secondary | ICD-10-CM | POA: Insufficient documentation

## 2015-12-08 DIAGNOSIS — Z881 Allergy status to other antibiotic agents status: Secondary | ICD-10-CM | POA: Diagnosis not present

## 2015-12-08 DIAGNOSIS — M1990 Unspecified osteoarthritis, unspecified site: Secondary | ICD-10-CM | POA: Diagnosis not present

## 2015-12-08 DIAGNOSIS — H2511 Age-related nuclear cataract, right eye: Secondary | ICD-10-CM | POA: Diagnosis present

## 2015-12-08 DIAGNOSIS — Q438 Other specified congenital malformations of intestine: Secondary | ICD-10-CM | POA: Diagnosis not present

## 2015-12-08 DIAGNOSIS — G629 Polyneuropathy, unspecified: Secondary | ICD-10-CM | POA: Diagnosis not present

## 2015-12-08 DIAGNOSIS — F419 Anxiety disorder, unspecified: Secondary | ICD-10-CM | POA: Diagnosis not present

## 2015-12-08 DIAGNOSIS — Z888 Allergy status to other drugs, medicaments and biological substances status: Secondary | ICD-10-CM | POA: Insufficient documentation

## 2015-12-08 DIAGNOSIS — Z880 Allergy status to penicillin: Secondary | ICD-10-CM | POA: Diagnosis not present

## 2015-12-08 DIAGNOSIS — K449 Diaphragmatic hernia without obstruction or gangrene: Secondary | ICD-10-CM | POA: Insufficient documentation

## 2015-12-08 DIAGNOSIS — G4730 Sleep apnea, unspecified: Secondary | ICD-10-CM | POA: Diagnosis not present

## 2015-12-08 DIAGNOSIS — F329 Major depressive disorder, single episode, unspecified: Secondary | ICD-10-CM | POA: Diagnosis not present

## 2015-12-08 DIAGNOSIS — G479 Sleep disorder, unspecified: Secondary | ICD-10-CM | POA: Insufficient documentation

## 2015-12-08 DIAGNOSIS — L409 Psoriasis, unspecified: Secondary | ICD-10-CM | POA: Diagnosis not present

## 2015-12-08 DIAGNOSIS — K589 Irritable bowel syndrome without diarrhea: Secondary | ICD-10-CM | POA: Insufficient documentation

## 2015-12-08 DIAGNOSIS — M810 Age-related osteoporosis without current pathological fracture: Secondary | ICD-10-CM | POA: Diagnosis not present

## 2015-12-08 DIAGNOSIS — D649 Anemia, unspecified: Secondary | ICD-10-CM | POA: Diagnosis not present

## 2015-12-08 DIAGNOSIS — M479 Spondylosis, unspecified: Secondary | ICD-10-CM | POA: Insufficient documentation

## 2015-12-08 DIAGNOSIS — K219 Gastro-esophageal reflux disease without esophagitis: Secondary | ICD-10-CM | POA: Diagnosis not present

## 2015-12-08 HISTORY — DX: Other specified postprocedural states: Z98.890

## 2015-12-08 HISTORY — DX: Sleep disorder, unspecified: G47.9

## 2015-12-08 HISTORY — DX: Personal history of other diseases of the digestive system: Z87.19

## 2015-12-08 HISTORY — DX: Other specified congenital malformations of intestine: Q43.8

## 2015-12-08 HISTORY — DX: Personal history of other specified conditions: Z87.898

## 2015-12-08 HISTORY — DX: Other complications of anesthesia, initial encounter: T88.59XA

## 2015-12-08 HISTORY — DX: Anemia, unspecified: D64.9

## 2015-12-08 HISTORY — DX: Dizziness and giddiness: R42

## 2015-12-08 HISTORY — DX: Gastro-esophageal reflux disease without esophagitis: K21.9

## 2015-12-08 HISTORY — DX: Spondylosis without myelopathy or radiculopathy, cervical region: M47.812

## 2015-12-08 HISTORY — DX: Polyneuropathy, unspecified: G62.9

## 2015-12-08 HISTORY — DX: Spondylosis, unspecified: M47.9

## 2015-12-08 HISTORY — DX: Adverse effect of unspecified anesthetic, initial encounter: T41.45XA

## 2015-12-08 HISTORY — DX: Sleep apnea, unspecified: G47.30

## 2015-12-08 HISTORY — DX: Irritable bowel syndrome, unspecified: K58.9

## 2015-12-08 HISTORY — DX: Psoriasis, unspecified: L40.9

## 2015-12-08 HISTORY — DX: Nausea with vomiting, unspecified: R11.2

## 2015-12-08 HISTORY — DX: Nausea with vomiting, unspecified: Z98.890

## 2015-12-08 HISTORY — PX: CATARACT EXTRACTION W/PHACO: SHX586

## 2015-12-08 SURGERY — PHACOEMULSIFICATION, CATARACT, WITH IOL INSERTION
Anesthesia: Monitor Anesthesia Care | Site: Eye | Laterality: Right | Wound class: Clean

## 2015-12-08 MED ORDER — PHENYLEPHRINE HCL 10 % OP SOLN
OPHTHALMIC | Status: AC
  Administered 2015-12-08: 1 [drp] via OPHTHALMIC
  Filled 2015-12-08: qty 5

## 2015-12-08 MED ORDER — POVIDONE-IODINE 5 % OP SOLN
1.0000 "application " | OPHTHALMIC | Status: AC | PRN
  Administered 2015-12-08: 1 via OPHTHALMIC

## 2015-12-08 MED ORDER — LIDOCAINE HCL 3.5 % OP GEL
OPHTHALMIC | Status: AC
  Administered 2015-12-08: 1 via OPHTHALMIC
  Filled 2015-12-08: qty 1

## 2015-12-08 MED ORDER — SODIUM CHLORIDE 0.9 % IV SOLN
INTRAVENOUS | Status: DC
Start: 2015-12-08 — End: 2015-12-08
  Administered 2015-12-08: 07:00:00 via INTRAVENOUS

## 2015-12-08 MED ORDER — EPINEPHRINE HCL 1 MG/ML IJ SOLN
INTRAMUSCULAR | Status: AC
  Filled 2015-12-08: qty 1

## 2015-12-08 MED ORDER — PHENYLEPHRINE HCL 10 % OP SOLN
1.0000 [drp] | OPHTHALMIC | Status: AC | PRN
  Administered 2015-12-08 (×4): 1 [drp] via OPHTHALMIC

## 2015-12-08 MED ORDER — TETRACAINE HCL 0.5 % OP SOLN
1.0000 [drp] | OPHTHALMIC | Status: DC | PRN
  Administered 2015-12-08 (×2): 1 [drp] via OPHTHALMIC

## 2015-12-08 MED ORDER — CYCLOPENTOLATE HCL 2 % OP SOLN
OPHTHALMIC | Status: AC
  Administered 2015-12-08: 1 [drp] via OPHTHALMIC
  Filled 2015-12-08: qty 2

## 2015-12-08 MED ORDER — CEFUROXIME OPHTHALMIC INJECTION 1 MG/0.1 ML
INJECTION | OPHTHALMIC | Status: AC
  Filled 2015-12-08: qty 0.1

## 2015-12-08 MED ORDER — NA CHONDROIT SULF-NA HYALURON 40-17 MG/ML IO SOLN
INTRAOCULAR | Status: AC
  Filled 2015-12-08: qty 1

## 2015-12-08 MED ORDER — CYCLOPENTOLATE HCL 2 % OP SOLN
1.0000 [drp] | OPHTHALMIC | Status: AC | PRN
  Administered 2015-12-08 (×4): 1 [drp] via OPHTHALMIC

## 2015-12-08 MED ORDER — POLYMYXIN B-TRIMETHOPRIM 10000-0.1 UNIT/ML-% OP SOLN
1.0000 [drp] | OPHTHALMIC | Status: AC | PRN
  Administered 2015-12-08 (×3): 1 [drp] via OPHTHALMIC

## 2015-12-08 MED ORDER — NA CHONDROIT SULF-NA HYALURON 40-17 MG/ML IO SOLN
INTRAOCULAR | Status: DC | PRN
  Administered 2015-12-08: 1 mL via INTRAOCULAR

## 2015-12-08 MED ORDER — ONDANSETRON HCL 4 MG/2ML IJ SOLN: 4.0000 mg | Freq: Once | INTRAMUSCULAR | Status: DC | PRN

## 2015-12-08 MED ORDER — POVIDONE-IODINE 5 % OP SOLN
OPHTHALMIC | Status: AC
  Administered 2015-12-08: 1 via OPHTHALMIC
  Filled 2015-12-08: qty 30

## 2015-12-08 MED ORDER — LIDOCAINE HCL 3.5 % OP GEL
1.0000 "application " | OPHTHALMIC | Status: AC | PRN
  Administered 2015-12-08: 1 via OPHTHALMIC

## 2015-12-08 MED ORDER — CARBACHOL 0.01 % IO SOLN
INTRAOCULAR | Status: DC | PRN
  Administered 2015-12-08: .5 mL via INTRAOCULAR

## 2015-12-08 MED ORDER — TETRACAINE HCL 0.5 % OP SOLN
OPHTHALMIC | Status: AC
  Administered 2015-12-08: 1 [drp] via OPHTHALMIC
  Filled 2015-12-08: qty 2

## 2015-12-08 MED ORDER — EPINEPHRINE HCL 1 MG/ML IJ SOLN
INTRAOCULAR | Status: DC | PRN
  Administered 2015-12-08: 1 mL via OPHTHALMIC

## 2015-12-08 MED ORDER — FENTANYL CITRATE (PF) 100 MCG/2ML IJ SOLN
INTRAMUSCULAR | Status: DC | PRN
  Administered 2015-12-08 (×2): 25 ug via INTRAVENOUS

## 2015-12-08 MED ORDER — FENTANYL CITRATE (PF) 100 MCG/2ML IJ SOLN: 25.0000 ug | INTRAMUSCULAR | Status: DC | PRN

## 2015-12-08 MED ORDER — POLYMYXIN B-TRIMETHOPRIM 10000-0.1 UNIT/ML-% OP SOLN
OPHTHALMIC | Status: AC
  Administered 2015-12-08: 1 [drp] via OPHTHALMIC
  Filled 2015-12-08: qty 10

## 2015-12-08 MED ORDER — POLYMYXIN B-TRIMETHOPRIM 10000-0.1 UNIT/ML-% OP SOLN
OPHTHALMIC | Status: DC | PRN
  Administered 2015-12-08: 1 [drp] via OPHTHALMIC

## 2015-12-08 SURGICAL SUPPLY — 22 items
CANNULA ANT/CHMB 27GA (MISCELLANEOUS) ×3 IMPLANT
CUP MEDICINE 2OZ PLAST GRAD ST (MISCELLANEOUS) ×3 IMPLANT
GLOVE BIO SURGEON STRL SZ8 (GLOVE) ×3 IMPLANT
GLOVE BIOGEL M 6.5 STRL (GLOVE) ×3 IMPLANT
GLOVE SURG LX 8.0 MICRO (GLOVE) ×2
GLOVE SURG LX STRL 8.0 MICRO (GLOVE) ×1 IMPLANT
GOWN STRL REUS W/ TWL LRG LVL3 (GOWN DISPOSABLE) ×2 IMPLANT
GOWN STRL REUS W/TWL LRG LVL3 (GOWN DISPOSABLE) ×4
LENS IOL ACRSF IQ PC 19.5 (Intraocular Lens) ×1 IMPLANT
LENS IOL ACRYSOF IQ POST 19.5 (Intraocular Lens) ×3 IMPLANT
PACK CATARACT (MISCELLANEOUS) ×3 IMPLANT
PACK CATARACT BRASINGTON LX (MISCELLANEOUS) ×3 IMPLANT
PACK EYE AFTER SURG (MISCELLANEOUS) ×3 IMPLANT
SOL BSS BAG (MISCELLANEOUS) ×3
SOL PREP PVP 2OZ (MISCELLANEOUS) ×3
SOLUTION BSS BAG (MISCELLANEOUS) ×1 IMPLANT
SOLUTION PREP PVP 2OZ (MISCELLANEOUS) ×1 IMPLANT
SYR 3ML LL SCALE MARK (SYRINGE) ×3 IMPLANT
SYR 5ML LL (SYRINGE) ×3 IMPLANT
SYR TB 1ML 27GX1/2 LL (SYRINGE) ×3 IMPLANT
WATER STERILE IRR 1000ML POUR (IV SOLUTION) ×3 IMPLANT
WIPE NON LINTING 3.25X3.25 (MISCELLANEOUS) ×3 IMPLANT

## 2015-12-08 NOTE — Anesthesia Postprocedure Evaluation (Signed)
Anesthesia Post Note  Patient: Sandra Mcconnell  Procedure(s) Performed: Procedure(s) (LRB): CATARACT EXTRACTION PHACO AND INTRAOCULAR LENS PLACEMENT (IOC) (Right)  Patient location during evaluation: Short Stay Anesthesia Type: MAC Level of consciousness: awake and alert and oriented Pain management: pain level controlled Vital Signs Assessment: post-procedure vital signs reviewed and stable Respiratory status: spontaneous breathing Cardiovascular status: stable Anesthetic complications: no    Last Vitals:  Filed Vitals:   12/08/15 0724 12/08/15 0851  BP: 117/70 112/58  Pulse: 70 73  Temp: 36.7 C 36.5 C  Resp: 16 16    Last Pain: There were no vitals filed for this visit.               Lanora Manis

## 2015-12-08 NOTE — Discharge Instructions (Signed)
Eye Surgery Discharge Instructions  Expect mild scratchy sensation or mild soreness. DO NOT RUB YOUR EYE!  The day of surgery:  Minimal physical activity, but bed rest is not required  No reading, computer work, or close hand work  No bending, lifting, or straining.  May watch TV  For 24 hours:  No driving, legal decisions, or alcoholic beverages  Safety precautions  Eat anything you prefer: It is better to start with liquids, then soup then solid foods.  _____ Eye patch should be worn until postoperative exam tomorrow.  ____ Solar shield eyeglasses should be worn for comfort in the sunlight/patch while sleeping  Resume all regular medications including aspirin or Coumadin if these were discontinued prior to surgery. You may shower, bathe, shave, or wash your hair. Tylenol may be taken for mild discomfort.  Call your doctor if you experience significant pain, nausea, or vomiting, fever > 101 or other signs of infection. [phone] or [phone] Specific instructions:  Follow-up Information    Follow up with Tim Lair, MD On 12/09/2015.   Specialty:  Ophthalmology   Why:  10:25   Contact information:   [address] [place] [address] [phone]

## 2015-12-08 NOTE — Transfer of Care (Signed)
Immediate Anesthesia Transfer of Care Note  Patient: Sandra Mcconnell  Procedure(s) Performed: Procedure(s) with comments: CATARACT EXTRACTION PHACO AND INTRAOCULAR LENS PLACEMENT (IOC) (Right) - [place] 01:09 AP% 22.6 CDE 15.81 fluid pack lot # [id number]  Patient Location: PACU and Short Stay  Anesthesia Type:MAC  Level of Consciousness: awake, alert  and oriented  Airway & Oxygen Therapy: Patient Spontanous Breathing  Post-op Assessment: Report given to RN and Post -op Vital signs reviewed and stable  Post vital signs: Reviewed and stable  Last Vitals:  Filed Vitals:   12/08/15 0724 12/08/15 0851  BP: 117/70 112/58  Pulse: 70   Temp: 36.7 C 36.4 C  Resp: 16 16    Complications: No apparent anesthesia complications

## 2015-12-08 NOTE — H&P (Signed)
All labs reviewed. Abnormal studies sent to patients PCP when indicated.  Previous H&P reviewed, patient examined, there are NO CHANGES.  Sandra Mcconnell LOUIS1/31/20178:22 AM

## 2015-12-08 NOTE — Anesthesia Preprocedure Evaluation (Signed)
Anesthesia Evaluation  Patient identified by MRN, date of birth, ID band Patient awake    Reviewed: Allergy & Precautions, NPO status , Patient's Chart, lab work & pertinent test results  History of Anesthesia Complications (+) PONV and history of anesthetic complications  Airway Mallampati: III  TM Distance: <3 FB Neck ROM: Limited   Comment: Very limited Dental   Pulmonary sleep apnea ,    Pulmonary exam normal breath sounds clear to auscultation       Cardiovascular Normal cardiovascular exam     Neuro/Psych PSYCHIATRIC DISORDERS Anxiety Depression Cervical cord compression/ neuropathy  Neuromuscular disease    GI/Hepatic Neg liver ROS, hiatal hernia, GERD  Medicated and Controlled,Colitis Hx   Endo/Other  negative endocrine ROS  Renal/GU negative Renal ROS     Musculoskeletal  (+) Arthritis , Osteoarthritis,    Abdominal Normal abdominal exam  (+)   Peds  Hematology  (+) anemia ,   Anesthesia Other Findings   Reproductive/Obstetrics                             Anesthesia Physical Anesthesia Plan  ASA: III  Anesthesia Plan: MAC   Post-op Pain Management:    Induction: Intravenous  Airway Management Planned: Nasal Cannula  Additional Equipment:   Intra-op Plan:   Post-operative Plan:   Informed Consent: I have reviewed the patients History and Physical, chart, labs and discussed the procedure including the risks, benefits and alternatives for the proposed anesthesia with the patient or authorized representative who has indicated his/her understanding and acceptance.   Dental advisory given  Plan Discussed with: CRNA and Surgeon  Anesthesia Plan Comments:         Anesthesia Quick Evaluation

## 2015-12-08 NOTE — Op Note (Signed)
PREOPERATIVE DIAGNOSIS:  Nuclear sclerotic cataract of the right eye.   POSTOPERATIVE DIAGNOSIS: nuclear sclerotic catartact right eye   OPERATIVE PROCEDURE:  Procedure(s): CATARACT EXTRACTION PHACO AND INTRAOCULAR LENS PLACEMENT (IOC)   SURGEON:  Birder Robson, MD.   ANESTHESIA:  Anesthesiologist: Alvin Critchley, MD CRNA: Jonna Clark, CRNA  1.      Managed anesthesia care. 2.      Topical tetracaine drops followed by 2% Xylocaine jelly applied in the preoperative holding area.   COMPLICATIONS:  None.   TECHNIQUE:   Stop and chop   DESCRIPTION OF PROCEDURE:  The patient was examined and consented in the preoperative holding area where the aforementioned topical anesthesia was applied to the right eye and then brought back to the Operating Room where the right eye was prepped and draped in the usual sterile ophthalmic fashion and a lid speculum was placed. A paracentesis was created with the side port blade and the anterior chamber was filled with viscoelastic. A near clear corneal incision was performed with the steel keratome. A continuous curvilinear capsulorrhexis was performed with a cystotome followed by the capsulorrhexis forceps. Hydrodissection and hydrodelineation were carried out with BSS on a blunt cannula. The lens was removed in a stop and chop  technique and the remaining cortical material was removed with the irrigation-aspiration handpiece. The capsular bag was inflated with viscoelastic and the Technis [id number]  lens was placed in the capsular bag without complication. The remaining viscoelastic was removed from the eye with the irrigation-aspiration handpiece. The wounds were hydrated. The anterior chamber was flushed with Miostat and the eye was inflated to physiologic pressure. . The wounds were found to be water tight. The eye was dressed with Polytrim. The patient was given protective glasses to wear throughout the day and a shield with which to sleep tonight. The  patient was also given drops with which to begin a drop regimen today and will follow-up with me in one day.  Implant Name Type Inv. Item Serial No. Manufacturer Lot No. LRB No. Used  IMPLANT LENS - [id number] Intraocular Lens IMPLANT LENS [id number] ALCON   Right 1   Procedure(s) with comments: CATARACT EXTRACTION PHACO AND INTRAOCULAR LENS PLACEMENT (IOC) (Right) - [place] 01:09 AP% 22.6 CDE 15.81 fluid pack lot # [id number]  Electronically signed: [place] 12/08/2015 8:49 AM

## 2016-01-08 ENCOUNTER — Other Ambulatory Visit: Payer: Self-pay | Admitting: Family Medicine

## 2016-01-08 DIAGNOSIS — Z1231 Encounter for screening mammogram for malignant neoplasm of breast: Secondary | ICD-10-CM

## 2016-01-20 ENCOUNTER — Ambulatory Visit
Admission: RE | Admit: 2016-01-20 | Discharge: 2016-01-20 | Disposition: A | Discharge status: Home / Self Care | Payer: Medicare Other | Source: Ambulatory Visit | Attending: Family Medicine | Admitting: Family Medicine

## 2016-01-20 ENCOUNTER — Other Ambulatory Visit: Payer: Self-pay | Admitting: Family Medicine

## 2016-01-20 DIAGNOSIS — Z1231 Encounter for screening mammogram for malignant neoplasm of breast: Secondary | ICD-10-CM

## 2016-07-22 DIAGNOSIS — G4752 REM sleep behavior disorder: Secondary | ICD-10-CM | POA: Insufficient documentation

## 2017-01-10 ENCOUNTER — Other Ambulatory Visit: Payer: Self-pay | Admitting: Family Medicine

## 2017-01-10 DIAGNOSIS — Z1239 Encounter for other screening for malignant neoplasm of breast: Secondary | ICD-10-CM

## 2017-02-13 ENCOUNTER — Ambulatory Visit
Admission: RE | Admit: 2017-02-13 | Discharge: 2017-02-13 | Disposition: A | Discharge status: Home / Self Care | Payer: Medicare Other | Source: Ambulatory Visit | Attending: Family Medicine | Admitting: Family Medicine

## 2017-02-13 DIAGNOSIS — Z1231 Encounter for screening mammogram for malignant neoplasm of breast: Secondary | ICD-10-CM | POA: Diagnosis present

## 2017-02-13 DIAGNOSIS — Z1239 Encounter for other screening for malignant neoplasm of breast: Secondary | ICD-10-CM

## 2017-08-02 ENCOUNTER — Other Ambulatory Visit: Payer: Self-pay | Admitting: Nurse Practitioner

## 2017-08-02 DIAGNOSIS — R1310 Dysphagia, unspecified: Secondary | ICD-10-CM

## 2017-08-04 DIAGNOSIS — K5901 Slow transit constipation: Secondary | ICD-10-CM | POA: Insufficient documentation

## 2017-08-04 DIAGNOSIS — D649 Anemia, unspecified: Secondary | ICD-10-CM

## 2017-08-04 HISTORY — DX: Anemia, unspecified: D64.9

## 2017-08-07 ENCOUNTER — Ambulatory Visit
Admission: RE | Admit: 2017-08-07 | Discharge: 2017-08-07 | Disposition: A | Discharge status: Home / Self Care | Payer: Medicare Other | Source: Ambulatory Visit | Attending: Nurse Practitioner | Admitting: Nurse Practitioner

## 2017-08-07 DIAGNOSIS — K449 Diaphragmatic hernia without obstruction or gangrene: Secondary | ICD-10-CM | POA: Insufficient documentation

## 2017-08-07 DIAGNOSIS — K228 Other specified diseases of esophagus: Secondary | ICD-10-CM | POA: Diagnosis not present

## 2017-08-07 DIAGNOSIS — Z981 Arthrodesis status: Secondary | ICD-10-CM | POA: Diagnosis not present

## 2017-08-07 DIAGNOSIS — R1310 Dysphagia, unspecified: Secondary | ICD-10-CM

## 2017-11-14 ENCOUNTER — Encounter: Payer: Self-pay | Admitting: Emergency Medicine

## 2017-11-15 ENCOUNTER — Encounter
Admission: RE | Disposition: A | Discharge status: Home / Self Care | Payer: Self-pay | Source: Ambulatory Visit | Attending: Unknown Physician Specialty

## 2017-11-15 ENCOUNTER — Ambulatory Visit
Admission: RE | Admit: 2017-11-15 | Discharge: 2017-11-15 | Disposition: A | Discharge status: Home / Self Care | Payer: Medicare Other | Source: Ambulatory Visit | Attending: Unknown Physician Specialty | Admitting: Unknown Physician Specialty

## 2017-11-15 ENCOUNTER — Ambulatory Visit: Payer: Medicare Other | Admitting: Anesthesiology

## 2017-11-15 ENCOUNTER — Encounter: Payer: Self-pay | Admitting: Emergency Medicine

## 2017-11-15 DIAGNOSIS — G5600 Carpal tunnel syndrome, unspecified upper limb: Secondary | ICD-10-CM | POA: Diagnosis not present

## 2017-11-15 DIAGNOSIS — G4730 Sleep apnea, unspecified: Secondary | ICD-10-CM | POA: Insufficient documentation

## 2017-11-15 DIAGNOSIS — K589 Irritable bowel syndrome without diarrhea: Secondary | ICD-10-CM | POA: Insufficient documentation

## 2017-11-15 DIAGNOSIS — Z880 Allergy status to penicillin: Secondary | ICD-10-CM | POA: Diagnosis not present

## 2017-11-15 DIAGNOSIS — G629 Polyneuropathy, unspecified: Secondary | ICD-10-CM | POA: Insufficient documentation

## 2017-11-15 DIAGNOSIS — Z79899 Other long term (current) drug therapy: Secondary | ICD-10-CM | POA: Insufficient documentation

## 2017-11-15 DIAGNOSIS — F329 Major depressive disorder, single episode, unspecified: Secondary | ICD-10-CM | POA: Diagnosis not present

## 2017-11-15 DIAGNOSIS — Z7982 Long term (current) use of aspirin: Secondary | ICD-10-CM | POA: Insufficient documentation

## 2017-11-15 DIAGNOSIS — Z803 Family history of malignant neoplasm of breast: Secondary | ICD-10-CM | POA: Diagnosis not present

## 2017-11-15 DIAGNOSIS — K219 Gastro-esophageal reflux disease without esophagitis: Secondary | ICD-10-CM | POA: Insufficient documentation

## 2017-11-15 DIAGNOSIS — L409 Psoriasis, unspecified: Secondary | ICD-10-CM | POA: Diagnosis not present

## 2017-11-15 DIAGNOSIS — Z791 Long term (current) use of non-steroidal anti-inflammatories (NSAID): Secondary | ICD-10-CM | POA: Diagnosis not present

## 2017-11-15 DIAGNOSIS — M1990 Unspecified osteoarthritis, unspecified site: Secondary | ICD-10-CM | POA: Diagnosis not present

## 2017-11-15 DIAGNOSIS — Z7951 Long term (current) use of inhaled steroids: Secondary | ICD-10-CM | POA: Diagnosis not present

## 2017-11-15 DIAGNOSIS — Z981 Arthrodesis status: Secondary | ICD-10-CM | POA: Insufficient documentation

## 2017-11-15 DIAGNOSIS — R1310 Dysphagia, unspecified: Secondary | ICD-10-CM | POA: Insufficient documentation

## 2017-11-15 DIAGNOSIS — I739 Peripheral vascular disease, unspecified: Secondary | ICD-10-CM | POA: Diagnosis not present

## 2017-11-15 DIAGNOSIS — Z888 Allergy status to other drugs, medicaments and biological substances status: Secondary | ICD-10-CM | POA: Insufficient documentation

## 2017-11-15 DIAGNOSIS — K648 Other hemorrhoids: Secondary | ICD-10-CM | POA: Insufficient documentation

## 2017-11-15 DIAGNOSIS — F419 Anxiety disorder, unspecified: Secondary | ICD-10-CM | POA: Insufficient documentation

## 2017-11-15 DIAGNOSIS — K640 First degree hemorrhoids: Secondary | ICD-10-CM | POA: Diagnosis not present

## 2017-11-15 DIAGNOSIS — Z1211 Encounter for screening for malignant neoplasm of colon: Secondary | ICD-10-CM | POA: Insufficient documentation

## 2017-11-15 DIAGNOSIS — Z881 Allergy status to other antibiotic agents status: Secondary | ICD-10-CM | POA: Diagnosis not present

## 2017-11-15 DIAGNOSIS — Z9841 Cataract extraction status, right eye: Secondary | ICD-10-CM | POA: Insufficient documentation

## 2017-11-15 DIAGNOSIS — Z79891 Long term (current) use of opiate analgesic: Secondary | ICD-10-CM | POA: Insufficient documentation

## 2017-11-15 DIAGNOSIS — K449 Diaphragmatic hernia without obstruction or gangrene: Secondary | ICD-10-CM | POA: Insufficient documentation

## 2017-11-15 DIAGNOSIS — Z90710 Acquired absence of both cervix and uterus: Secondary | ICD-10-CM | POA: Insufficient documentation

## 2017-11-15 DIAGNOSIS — Z961 Presence of intraocular lens: Secondary | ICD-10-CM | POA: Insufficient documentation

## 2017-11-15 HISTORY — DX: Obsessive-compulsive disorder, unspecified: F42.9

## 2017-11-15 HISTORY — DX: Varicella without complication: B01.9

## 2017-11-15 HISTORY — DX: Unspecified iridocyclitis: H20.9

## 2017-11-15 HISTORY — DX: Unspecified osteoarthritis, unspecified site: M19.90

## 2017-11-15 HISTORY — DX: Unspecified hemorrhoids: K64.9

## 2017-11-15 HISTORY — DX: Carpal tunnel syndrome, unspecified upper limb: G56.00

## 2017-11-15 HISTORY — DX: Slow transit constipation: K59.01

## 2017-11-15 HISTORY — DX: Dysphagia, unspecified: R13.10

## 2017-11-15 HISTORY — DX: Panic disorder (episodic paroxysmal anxiety): F41.0

## 2017-11-15 HISTORY — PX: COLONOSCOPY WITH PROPOFOL: SHX5780

## 2017-11-15 HISTORY — DX: REM sleep behavior disorder: G47.52

## 2017-11-15 HISTORY — DX: Age-related osteoporosis without current pathological fracture: M81.0

## 2017-11-15 HISTORY — DX: Cortical age-related cataract, unspecified eye: H25.019

## 2017-11-15 HISTORY — PX: ESOPHAGOGASTRODUODENOSCOPY: SHX5428

## 2017-11-15 HISTORY — DX: Migraine, unspecified, not intractable, without status migrainosus: G43.909

## 2017-11-15 SURGERY — EGD (ESOPHAGOGASTRODUODENOSCOPY)
Anesthesia: General

## 2017-11-15 MED ORDER — PROPOFOL 10 MG/ML IV BOLUS
INTRAVENOUS | Status: DC | PRN
  Administered 2017-11-15: 100 mg via INTRAVENOUS

## 2017-11-15 MED ORDER — SODIUM CHLORIDE 0.9 % IV SOLN
INTRAVENOUS | Status: DC
  Administered 2017-11-15: 1000 mL via INTRAVENOUS

## 2017-11-15 MED ORDER — PHENYLEPHRINE HCL 10 MG/ML IJ SOLN
INTRAMUSCULAR | Status: DC | PRN
  Administered 2017-11-15 (×3): 100 ug via INTRAVENOUS

## 2017-11-15 MED ORDER — SODIUM CHLORIDE 0.9 % IV SOLN: INTRAVENOUS | Status: DC

## 2017-11-15 MED ORDER — EPHEDRINE SULFATE 50 MG/ML IJ SOLN
INTRAMUSCULAR | Status: DC | PRN
  Administered 2017-11-15 (×2): 5 mg via INTRAVENOUS

## 2017-11-15 MED ORDER — FENTANYL CITRATE (PF) 100 MCG/2ML IJ SOLN
INTRAMUSCULAR | Status: DC | PRN
  Administered 2017-11-15: 50 ug via INTRAVENOUS

## 2017-11-15 MED ORDER — LIDOCAINE 2% (20 MG/ML) 5 ML SYRINGE
INTRAMUSCULAR | Status: DC | PRN
  Administered 2017-11-15: 40 mg via INTRAVENOUS

## 2017-11-15 MED ORDER — PROPOFOL 500 MG/50ML IV EMUL
INTRAVENOUS | Status: DC | PRN
  Administered 2017-11-15: 140 ug/kg/min via INTRAVENOUS

## 2017-11-15 MED ORDER — FENTANYL CITRATE (PF) 100 MCG/2ML IJ SOLN
INTRAMUSCULAR | Status: AC
  Filled 2017-11-15: qty 4

## 2017-11-15 MED ORDER — PROPOFOL 500 MG/50ML IV EMUL
INTRAVENOUS | Status: AC
  Filled 2017-11-15: qty 50

## 2017-11-15 NOTE — H&P (Signed)
Primary Care Physician:  Juluis Pitch, MD Primary Gastroenterologist:  Dr. Vira Agar  Pre-Procedure History & Physical: HPI:  Sandra Mcconnell is a 77 y.o. female is here for an endoscopy and colonoscopy.   Past Medical History:  Diagnosis Date  . Anemia   . Anemia 08/04/2017  . Anemia 08/04/2017  . Anxiety   . Carpal tunnel syndrome   . Carpal tunnel syndrome   . Cataract cortical, senile   . Cervical spinal cord compression ([place])   . Chicken pox   . Colitis   . Complication of anesthesia   . Depression   . Dizziness   . DJD (degenerative joint disease)   . Dysphagia   . GERD (gastroesophageal reflux disease)   . Hemorrhoids   . History of hiatal hernia   . History of palpitations    EMOTIONAL PALPITATIONS  . IBS (irritable bowel syndrome)   . Iritis   . Migraines   . Neuropathy   . OCD (obsessive compulsive disorder)   . Osteoarthritis   . Osteoporosis   . Panic attacks   . PONV (postoperative nausea and vomiting)   . Psoriasis   . Psoriasis   . Redundant colon   . REM sleep behavior disorder   . Sleep apnea   . Sleep disorder    REM SLEEP DISORDER  . Slow transit constipation   . Spondylosis    CERVICAL,SLEEPS WITH HOB ELEVATED AND WEARS NECK SUPPORT  . Spondylosis of cervical joint   . Spondylosis of cervical joint     Past Surgical History:  Procedure Laterality Date  . ABDOMINAL HYSTERECTOMY    . BACK SURGERY     CERVICAL FUSION 2003/DECOMPRESSION 2014 LUMBAR LAM 2009  . CATARACT EXTRACTION W/PHACO Right 12/08/2015   Procedure: CATARACT EXTRACTION PHACO AND INTRAOCULAR LENS PLACEMENT (IOC);  Surgeon: Birder Robson, MD;  Location: [facility] ORS;  Service: Ophthalmology;  Laterality: Right;  [place] 01:09 AP% 22.6 CDE 15.81 fluid pack lot # [id number]  . COLONOSCOPY    . ESOPHAGOGASTRODUODENOSCOPY    . EYE SURGERY    . FOOT FUSION Bilateral   . HAND SURGERY    . SPINE SURGERY    . TONSILLECTOMY    . 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 Authorizing Provider  acetaminophen (TYLENOL) 325 MG tablet Take 650 mg by mouth 3 (three) times daily.   Yes [provider]  aspirin 81 MG tablet Take 81 mg by mouth daily.   Yes [provider]  betamethasone dipropionate (DIPROLENE) 0.05 % ointment  09/17/14  Yes [provider]  buprenorphine (BUTRANS - DOSED MCG/HR) 5 MCG/HR PTWK patch Place 5 mcg onto the skin once a week.   Yes [provider]  calcitonin, salmon, (MIACALCIN/FORTICAL) 200 UNIT/ACT nasal spray Place 1 spray into alternate nostrils.  10/10/14  Yes [provider]  Calcium Carbonate-Vitamin D 600-400 MG-UNIT tablet Take 1 tablet by mouth 2 (two) times daily.   Yes [provider]  chlorhexidine (PERIDEX) 0.12 % solution Use as directed in the mouth or throat 2 (two) times daily.  09/29/14  Yes [provider]  clonazePAM (KLONOPIN) 0.5 MG tablet Take 1 mg by mouth at bedtime. 2 TO 3 09/04/14  Yes [provider]  diclofenac sodium (VOLTAREN) 1 % GEL Apply topically. AS NEEDED   Yes [provider]  fluticasone (FLONASE) 50 MCG/ACT nasal spray  10/10/14  Yes [provider]  gabapentin (NEURONTIN) 100 MG capsule Take 200 mg by mouth 3 (three) times daily.   Yes [provider]  Glucosamine Sulfate 500 MG CAPS Take 500 mg by mouth 3 (three) times daily.   Yes [provider]  ipratropium (ATROVENT) 0.03 % nasal spray Place 2 sprays into both nostrils daily.  10/03/14  Yes [provider]  ketorolac (TORADOL) 10 MG tablet Take 10 mg by mouth. NO MORE THAN 5 PER MONTH   Yes [provider]  Lactobacillus Acidophilus POWD Take 10 mg by mouth 2 (two) times daily.   Yes [provider]  lidocaine (LIDODERM) 5 % Place 1 patch onto the skin as needed. Remove & Discard patch within 12 hours or as directed by MD   Yes [provider]   omeprazole (PRILOSEC) 20 MG capsule Take 20 mg by mouth daily.  10/16/14  Yes [provider]  ondansetron (ZOFRAN) 4 MG tablet  10/28/14  Yes [provider]  polyethylene glycol powder (GLYCOLAX/MIRALAX) powder Take 17 g by mouth daily.  09/12/14  Yes [provider]  raloxifene (EVISTA) 60 MG tablet Take 60 mg by mouth daily. 10/16/14  Yes [provider]  rOPINIRole (REQUIP) 0.5 MG tablet Take 1 mg by mouth at bedtime. 10/06/14  Yes [provider]  sertraline (ZOLOFT) 100 MG tablet Take 200 mg by mouth daily.  08/15/14  Yes [provider]  Simethicone 180 MG CAPS Take 180 mg by mouth 2 (two) times daily.   Yes [provider]  tiZANidine (ZANAFLEX) 4 MG tablet 2 mg every 6 (six) hours as needed.  10/22/14  Yes [provider]  traZODone (DESYREL) 50 MG tablet Take 50 mg by mouth. 1/2 QID AND 1 HS 10/16/14  Yes [provider]  oxyCODONE (OXY IR/ROXICODONE) 5 MG immediate release tablet Take 5 mg by mouth every 4 (four) hours as needed for severe pain. 1/2 TO 1  USED RARELY    [provider]    Allergies as of 10/02/2017 - Review Complete 12/08/2015  Allergen Reaction Noted  . Aripiprazole Nausea And Vomiting 10/29/2014  . Ciprofloxacin Swelling 10/29/2014  . Mirtazapine Nausea And Vomiting 10/29/2014  . Penicillins Swelling 10/29/2014    Family History  Problem Relation Age of Onset  . Breast cancer Mother        60's    Social History   Socioeconomic History  . Marital status: Married    Spouse name: Not on file  . Number of children: Not on file  . Years of education: Not on file  . Highest education level: Not on file  Social Needs  . Financial resource strain: Not on file  . Food insecurity - worry: Not on file  . Food insecurity - inability: Not on file  . Transportation needs - medical: Not on file  . Transportation needs - non-medical: Not on file  Occupational History  .  Not on file  Tobacco Use  . Smoking status: Never Smoker  . Smokeless tobacco: Never Used  Substance and Sexual Activity  . Alcohol use: No    Alcohol/week: 0.0 oz  . Drug use: No  . Sexual activity: Not on file  Other Topics Concern  . Not on file  Social History Narrative  . Not on file    Review of Systems: See HPI, otherwise negative ROS  Physical Exam: BP (!) 118/56   Pulse 76   Temp 99 F (37.2 C) (  Tympanic)   Resp 18   Ht 4\' 10"  (1.473 m)   Wt 69.9 kg (154 lb)   SpO2 97%   BMI 32.19 kg/m  General:   Alert,  pleasant and cooperative in NAD Head:  Normocephalic and atraumatic. Neck:  Supple; no masses or thyromegaly. Lungs:  Clear throughout to auscultation.    Heart:  Regular rate and rhythm. Abdomen:  Soft, nontender and nondistended. Normal bowel sounds, without guarding, and without rebound.   Neurologic:  Alert and  oriented x4;  grossly normal neurologically.  Impression/Plan: Sandra Mcconnell is here for an endoscopy and colonoscopy to be performed for dysphagia and PH colon polyps.  Risks, benefits, limitations, and alternatives regarding  endoscopy and colonoscopy have been reviewed with the patient.  Questions have been answered.  All parties agreeable.   Gaylyn Cheers, MD  11/15/2017, 2:08 PM

## 2017-11-15 NOTE — Transfer of Care (Signed)
Immediate Anesthesia Transfer of Care Note  Patient: Sandra Mcconnell  Procedure(s) Performed: ESOPHAGOGASTRODUODENOSCOPY (EGD) (N/A ) COLONOSCOPY WITH PROPOFOL (N/A )  Patient Location: PACU and Endoscopy Unit  Anesthesia Type:General  Level of Consciousness: drowsy  Airway & Oxygen Therapy: Patient Spontanous Breathing and Patient connected to nasal cannula oxygen  Post-op Assessment: Report given to RN and Post -op Vital signs reviewed and stable  Post vital signs: Reviewed and stable  Last Vitals:  Vitals:   11/15/17 1333  BP: (!) 118/56  Pulse: 76  Resp: 18  Temp: 37.2 C  SpO2: 97%    Last Pain:  Vitals:   11/15/17 1333  TempSrc: Tympanic  PainSc: 5          Complications: No apparent anesthesia complications

## 2017-11-15 NOTE — Op Note (Signed)
[facility] Gastroenterology Patient Name: Sandra Mcconnell Procedure Date: 11/15/2017 2:12 PM MRN: [id number] Account #: [id number] Date of Birth: [birth year] Admit Type: Outpatient Age: 77 Room: [facility] ENDO ROOM 4 Gender: Female Note Status: Finalized Procedure:            Colonoscopy Indications:          Screening for colorectal malignant neoplasm Providers:            Manya Silvas, MD Referring MD:         Youlanda Roys. Lovie Macadamia, MD (Referring MD) Medicines:            Propofol per Anesthesia Complications:        No immediate complications. Procedure:            Pre-Anesthesia Assessment:                       - After reviewing the risks and benefits, the patient                        was deemed in satisfactory condition to undergo the                        procedure.                       After obtaining informed consent, the colonoscope was                        passed under direct vision. Throughout the procedure,                        the patient's blood pressure, pulse, and oxygen                        saturations were monitored continuously. The                        Colonoscope was introduced through the anus and                        advanced to the the cecum, identified by appendiceal                        orifice and ileocecal valve. The colonoscopy was                        performed without difficulty. The patient tolerated the                        procedure well. The quality of the bowel preparation                        was excellent. Findings:      Internal hemorrhoids were found during endoscopy. The hemorrhoids were       small and Grade I (internal hemorrhoids that do not prolapse).      The exam was otherwise without abnormality. No polyps seen. Impression:           - Internal hemorrhoids.                       -  The examination was otherwise normal.                       - No specimens collected. Recommendation:       - The  findings and recommendations were discussed with                        the patient's family. Due to age I would not repeat the                        procedure. Manya Silvas, MD 11/15/2017 2:53:26 PM This report has been signed electronically. Number of Addenda: 0 Note Initiated On: 11/15/2017 2:12 PM Scope Withdrawal Time: 0 hours 8 minutes 38 seconds  Total Procedure Duration: 0 hours 15 minutes 48 seconds       [facility]

## 2017-11-15 NOTE — Anesthesia Preprocedure Evaluation (Addendum)
Anesthesia Evaluation  Patient identified by MRN, date of birth, ID band Patient awake    Reviewed: Allergy & Precautions, NPO status , Patient's Chart, lab work & pertinent test results  History of Anesthesia Complications (+) PONV and history of anesthetic complications  Airway Mallampati: III  TM Distance: <3 FB Neck ROM: Limited   Comment: Very limited Dental   Pulmonary sleep apnea ,    Pulmonary exam normal breath sounds clear to auscultation       Cardiovascular + Peripheral Vascular Disease  Normal cardiovascular exam+ dysrhythmias      Neuro/Psych  Headaches, PSYCHIATRIC DISORDERS Anxiety Depression Cervical cord compression/ neuropathy  Neuromuscular disease    GI/Hepatic Neg liver ROS, hiatal hernia, GERD  Medicated and Controlled,Colitis Hx   Endo/Other  negative endocrine ROS  Renal/GU negative Renal ROS     Musculoskeletal  (+) Arthritis , Osteoarthritis,    Abdominal Normal abdominal exam  (+)   Peds  Hematology  (+) anemia ,   Anesthesia Other Findings Past Medical History: No date: Anemia 08/04/2017: Anemia 08/04/2017: Anemia No date: Anxiety No date: Carpal tunnel syndrome No date: Carpal tunnel syndrome No date: Cataract cortical, senile No date: Cervical spinal cord compression (HCC) No date: Chicken pox No date: Colitis No date: Complication of anesthesia No date: Depression No date: Dizziness No date: DJD (degenerative joint disease) No date: Dysphagia No date: GERD (gastroesophageal reflux disease) No date: Hemorrhoids No date: History of hiatal hernia No date: History of palpitations     Comment:  EMOTIONAL PALPITATIONS No date: IBS (irritable bowel syndrome) No date: Iritis No date: Migraines No date: Neuropathy No date: OCD (obsessive compulsive disorder) No date: Osteoarthritis No date: Osteoporosis No date: Panic attacks No date: PONV (postoperative nausea and  vomiting) No date: Psoriasis No date: Psoriasis No date: Redundant colon 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 No date: Sleep apnea No date: Sleep disorder     Comment:  REM SLEEP DISORDER No date: Slow transit constipation No date: Spondylosis     Comment:  CERVICAL,SLEEPS WITH HOB ELEVATED AND WEARS NECK SUPPORT No date: Spondylosis of cervical joint No date: Spondylosis of cervical joint  Reproductive/Obstetrics                             Anesthesia Physical  Anesthesia Plan  ASA: III  Anesthesia Plan: General   Post-op Pain Management:    Induction: Intravenous  PONV Risk Score and Plan:   Airway Management Planned: Nasal Cannula  Additional Equipment:   Intra-op Plan:   Post-operative Plan:   Informed Consent: I have reviewed the patients History and Physical, chart, labs and discussed the procedure including the risks, benefits and alternatives for the proposed anesthesia with the patient or authorized representative who has indicated his/her understanding and acceptance.   Dental advisory given  Plan Discussed with: CRNA and Surgeon  Anesthesia Plan Comments:         Anesthesia Quick Evaluation

## 2017-11-15 NOTE — Op Note (Signed)
[facility] Gastroenterology Patient Name: Sandra Mcconnell Procedure Date: 11/15/2017 2:13 PM MRN: [id number] Account #: [id number] Date of Birth: 11-Feb-1941 Admit Type: Outpatient Age: 77 Room: [facility] ENDO ROOM 4 Gender: Female Note Status: Finalized Procedure:            Upper GI endoscopy Indications:          Dysphagia Providers:            Manya Silvas, MD Referring MD:         Youlanda Roys. Lovie Macadamia, MD (Referring MD) Medicines:            Propofol per Anesthesia Complications:        No immediate complications. Procedure:            Pre-Anesthesia Assessment:                       - After reviewing the risks and benefits, the patient                        was deemed in satisfactory condition to undergo the                        procedure.                       After obtaining informed consent, the endoscope was                        passed under direct vision. Throughout the procedure,                        the patient's blood pressure, pulse, and oxygen                        saturations were monitored continuously. The                        Colonoscope was introduced through the mouth, and                        advanced to the second part of duodenum. The upper GI                        endoscopy was accomplished without difficulty. The                        patient tolerated the procedure well. Findings:      The examined esophagus was normal. After exam of all areas A guidewire       was placed and the scope was withdrawn. Dilation was performed with a       Savary dilator with mild resistance at 15 mm and 16 mm.      Patchy mildly erythematous mucosa without bleeding was found in the       gastric body. One area of superficial erosion was seen.      The examined duodenum was normal. Impression:           - Normal esophagus. Dilated.                       - Erythematous mucosa in the gastric body.                       -  Normal examined duodenum.                      - No specimens collected. Recommendation:       - Perform a colonoscopy today. Manya Silvas, MD 11/15/2017 2:32:36 PM This report has been signed electronically. Number of Addenda: 0 Note Initiated On: 11/15/2017 2:13 PM      [facility]

## 2017-11-15 NOTE — Anesthesia Post-op Follow-up Note (Signed)
Anesthesia QCDR form completed.        

## 2017-11-16 ENCOUNTER — Encounter: Payer: Self-pay | Admitting: Unknown Physician Specialty

## 2017-11-19 NOTE — Anesthesia Postprocedure Evaluation (Signed)
Anesthesia Post Note  Patient: Sandra Mcconnell  Procedure(s) Performed: ESOPHAGOGASTRODUODENOSCOPY (EGD) (N/A ) COLONOSCOPY WITH PROPOFOL (N/A )  Patient location during evaluation: PACU Anesthesia Type: General Level of consciousness: awake and alert Pain management: pain level controlled Vital Signs Assessment: post-procedure vital signs reviewed and stable Respiratory status: spontaneous breathing, nonlabored ventilation, respiratory function stable and patient connected to nasal cannula oxygen Cardiovascular status: blood pressure returned to baseline and stable Postop Assessment: no apparent nausea or vomiting Anesthetic complications: no     Last Vitals:  Vitals:   11/15/17 1457 11/15/17 1458  BP: (!) 93/58 (!) 93/58  Pulse:  96  Resp:  19  Temp: (!) 36 C   SpO2: 97% 97%    Last Pain:  Vitals:   11/16/17 0812  TempSrc:   PainSc: 0-No pain                 Molli Barrows

## 2017-11-30 DIAGNOSIS — E739 Lactose intolerance, unspecified: Secondary | ICD-10-CM | POA: Insufficient documentation

## 2018-01-22 ENCOUNTER — Other Ambulatory Visit: Payer: Self-pay | Admitting: Family Medicine

## 2018-01-22 DIAGNOSIS — Z1231 Encounter for screening mammogram for malignant neoplasm of breast: Secondary | ICD-10-CM

## 2018-02-14 ENCOUNTER — Ambulatory Visit
Admission: RE | Admit: 2018-02-14 | Discharge: 2018-02-14 | Disposition: A | Discharge status: Home / Self Care | Payer: Medicare Other | Source: Ambulatory Visit | Attending: Family Medicine | Admitting: Family Medicine

## 2018-02-14 DIAGNOSIS — Z1231 Encounter for screening mammogram for malignant neoplasm of breast: Secondary | ICD-10-CM | POA: Diagnosis not present

## 2018-04-05 ENCOUNTER — Other Ambulatory Visit: Payer: Self-pay

## 2018-04-05 ENCOUNTER — Ambulatory Visit: Payer: Medicare Other | Attending: Rheumatology | Admitting: Occupational Therapy

## 2018-04-05 ENCOUNTER — Encounter: Payer: Self-pay | Admitting: Occupational Therapy

## 2018-04-05 DIAGNOSIS — M25642 Stiffness of left hand, not elsewhere classified: Secondary | ICD-10-CM | POA: Diagnosis present

## 2018-04-05 DIAGNOSIS — M65312 Trigger thumb, left thumb: Secondary | ICD-10-CM

## 2018-04-05 DIAGNOSIS — M25542 Pain in joints of left hand: Secondary | ICD-10-CM

## 2018-04-05 NOTE — Therapy (Signed)
[place] [facility] [address], [place], [address] Phone: [phone]   Fax:  [phone]  Occupational Therapy Evaluation  Patient Details  Name: Sandra Mcconnell MRN: [id number] Date of Birth: 04-24-1941 Referring Provider: Jefm Bryant    Encounter Date: 04/05/2018  OT End of Session - 04/05/18 2048    Visit Number  1    Number of Visits  8    Date for OT Re-Evaluation  05/03/18    OT Start Time  1350    OT Stop Time  1453    OT Time Calculation (min)  63 min    Activity Tolerance  Patient tolerated treatment well    Behavior During Therapy  [facility] for tasks assessed/performed       Past Medical History:  Diagnosis Date  . Anemia   . Anemia 08/04/2017  . Anemia 08/04/2017  . Anxiety   . Carpal tunnel syndrome   . Carpal tunnel syndrome   . Cataract cortical, senile   . Cervical spinal cord compression ([place])   . Chicken pox   . Colitis   . Complication of anesthesia   . Depression   . Dizziness   . DJD (degenerative joint disease)   . Dysphagia   . GERD (gastroesophageal reflux disease)   . Hemorrhoids   . History of hiatal hernia   . History of palpitations    EMOTIONAL PALPITATIONS  . IBS (irritable bowel syndrome)   . Iritis   . Migraines   . Neuropathy   . OCD (obsessive compulsive disorder)   . Osteoarthritis   . Osteoporosis   . Panic attacks   . PONV (postoperative nausea and vomiting)   . Psoriasis   . Psoriasis   . Redundant colon   . REM sleep behavior disorder   . Sleep apnea   . Sleep disorder    REM SLEEP DISORDER  . Slow transit constipation   . Spondylosis    CERVICAL,SLEEPS WITH HOB ELEVATED AND WEARS NECK SUPPORT  . Spondylosis of cervical joint   . Spondylosis of cervical joint     Past Surgical History:  Procedure Laterality Date  . ABDOMINAL HYSTERECTOMY    . BACK SURGERY     CERVICAL FUSION 2003/DECOMPRESSION 2014 LUMBAR LAM 2009  . CATARACT EXTRACTION W/PHACO  Right 12/08/2015   Procedure: CATARACT EXTRACTION PHACO AND INTRAOCULAR LENS PLACEMENT (IOC);  Surgeon: Birder Robson, MD;  Location: [facility] ORS;  Service: Ophthalmology;  Laterality: Right;  [place] 01:09 AP% 22.6 CDE 15.81 fluid pack lot # [id number]  . COLONOSCOPY    . COLONOSCOPY WITH PROPOFOL N/A 11/15/2017   Procedure: COLONOSCOPY WITH PROPOFOL;  Surgeon: Manya Silvas, MD;  Location: [facility] ENDOSCOPY;  Service: Endoscopy;  Laterality: N/A;  . ESOPHAGOGASTRODUODENOSCOPY    . ESOPHAGOGASTRODUODENOSCOPY N/A 11/15/2017   Procedure: ESOPHAGOGASTRODUODENOSCOPY (EGD);  Surgeon: Manya Silvas, MD;  Location: [facility] ENDOSCOPY;  Service: Endoscopy;  Laterality: N/A;  . EYE SURGERY    . FOOT FUSION Bilateral   . HAND SURGERY    . SPINE SURGERY    . TONSILLECTOMY    . 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  Subjective Assessment - 04/05/18 1400    Subjective   L thumb pain  increase about 3 months ago and it was  8-9/10 pain  before the shot - I cannot remember that I did something  - I do no use my hands  a lot amymore - I had in 20007 L thumb replacement and CTR - and in 2015 R thumb raplacement , trigger thumb release and  CTR - pain is better but still triggering and pain  4/10 at the best     Patient Stated Goals  If I can get the pain in my L thumb better and for it not to trigger     Currently in Pain?  Yes    Pain Score  4     Pain Location  Finger (Comment which one) thumb    Pain Orientation  Left    Pain Descriptors / Indicators  Aching;Tender    Pain Type  Chronic pain;Acute pain    Pain Onset  More than a month ago    Pain Frequency  Constant        [facility] OT Assessment - 04/05/18 0001      Assessment   Medical Diagnosis  R and L hand pain     Referring Provider  kernodle     Onset Date/Surgical Date  12/08/17    Hand Dominance  -- R and L handed       Home  Environment   Lives With  Spouse      Prior Function   Leisure  IPAD game,  typing , sew very little , use dragon on computer ,       Strength   Right Hand Grip (lbs)  25    Right Hand Lateral Pinch  9 lbs    Right Hand 3 Point Pinch  10 lbs    Left Hand Grip (lbs)  26    Left Hand Lateral Pinch  5 lbs    Left Hand 3 Point Pinch  6 lbs      Left Hand AROM   L Thumb IP 0-80  50 Degrees triggering    L Thumb Radial ADduction/ABduction 0-55  48    L Thumb Palmar ADduction/ABduction 0-45  58    L Thumb Opposition to Index  60 cm Opposition to 2nd fold  pain 4/10          contrast done prior to review of HEP     Contrast 2-3 x day  Several time ice massage over A1pulley of thumb  Splint for IP thumb during day wear - to decrease pain and triggering   avoid lat or 3 point grip  Use palms or larger joints   AROM for thumb PA and RA  And opposition to 5th - without triggering  After contrast            OT Education - 04/05/18 2048    Education Details  findings of eval and homeprogram    Person(s) Educated  Patient    Methods  Explanation;Demonstration    Comprehension  Verbalized understanding;Returned demonstration       OT Short Term Goals - 04/05/18 2054      OT SHORT TERM GOAL #1   Title  Pain and triggering in L thumb decrease to less than 2/1o pain and less than 2 triggers a day     Baseline  triggering several times during session , and pain at the best 4/10     Time  3    Period  Weeks    Status  New    Target Date  04/26/18        OT Long Term Goals - 04/05/18 2054      OT LONG TERM GOAL #1  Title  Pt to be ind in HEP and using splint to decrease pain , triggering of L thumb     Baseline  no knowledge of HEP or splint     Time  3    Period  Weeks    Status  New    Target Date  04/26/18      OT LONG TERM GOAL #2   Title  L thumb AROM improve to  5th opposition with no triggering and lat grip increase 2 lbs at least     Baseline  pain with last grip and increase pain - and opposition to 2nd fold of 5th with  triggering     Time  4    Period  Weeks    Status  New    Target Date  05/03/18            Plan - 04/05/18 2049    Clinical Impression Statement  Pt present at OT eval with pain in L thumb MP , and over A1pulley - report also pain in thenar eminence and cont to show some triggering at thumb - pain did decrease to 4/10  after  shot from Dr Jefm Bryant - pt can benefit from OT services to decrease triggering , pain  to increase functional use  of L hand  in ADL's and IADl's  - if pt do not respond on conservative treatment - will  recommend few sessions of ionto with dexamethazone     Occupational performance deficits (Please refer to evaluation for details):  ADL's;IADL's    Rehab Potential  Fair    Current Impairments/barriers affecting progress:  comorbitities     OT Frequency  2x / week    OT Duration  4 weeks    OT Treatment/Interventions  Self-care/ADL training;Therapeutic exercise;Patient/family education;Splinting;Iontophoresis;Ultrasound;Manual Therapy    Plan  assess progress with homeprogram     Clinical Decision Making  Several treatment options, min-mod task modification necessary    OT Home Exercise Plan  see pt instruction     Consulted and Agree with Plan of Care  Patient       Patient will benefit from skilled therapeutic intervention in order to improve the following deficits and impairments:  Impaired flexibility, Pain, Impaired UE functional use, Decreased strength, Decreased range of motion  Visit Diagnosis: Pain in joint of left hand - Plan: Ot plan of care cert/re-cert  Stiffness of left hand, not elsewhere classified - Plan: Ot plan of care cert/re-cert  Trigger finger of left thumb - Plan: Ot plan of care cert/re-cert    Problem List Patient Active Problem List   Diagnosis Date Noted  . 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 Urinary system disease 06/28/2013  . 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  FOM (frequency of micturition) 06/28/2013  . Cervical spinal cord compression ([place]) 11/28/2012  . Amnesia 11/09/2010    Rosalyn Gess  OTR/l,CLT 04/05/2018, 9:03 PM  [facility] [address] [place], [address] Phone: [phone]   Fax:  [phone]  Name: Sandra Mcconnell MRN: [id number] Date of Birth: [birth year]

## 2018-04-05 NOTE — Patient Instructions (Signed)
Contrast 2-3 x day  Several time ice massage over A1pulley of thumb  Splint for IP thumb during day wear - to decrease pain and triggering   avoid lat or 3 point grip  Use palms or larger joints   AROM for thumb PA and RA  And opposition to 5th - without triggering  After contrast

## 2018-04-11 ENCOUNTER — Ambulatory Visit: Payer: Medicare Other | Attending: Rheumatology | Admitting: Occupational Therapy

## 2018-04-11 DIAGNOSIS — M25642 Stiffness of left hand, not elsewhere classified: Secondary | ICD-10-CM | POA: Diagnosis present

## 2018-04-11 DIAGNOSIS — M25542 Pain in joints of left hand: Secondary | ICD-10-CM | POA: Diagnosis present

## 2018-04-11 DIAGNOSIS — M65312 Trigger thumb, left thumb: Secondary | ICD-10-CM | POA: Diagnosis present

## 2018-04-11 NOTE — Therapy (Signed)
[place] [facility] [address], [place], [address] Phone: [phone]   Fax:  [phone]  Occupational Therapy Treatment  Patient Details  Name: Sandra Mcconnell MRN: [id number] Date of Birth: 02-24-1941 Referring Provider: Jefm Bryant    Encounter Date: 04/11/2018  OT End of Session - 04/11/18 1451    Visit Number  2    Number of Visits  8    Date for OT Re-Evaluation  05/03/18    OT Start Time  1332    OT Stop Time  1421    OT Time Calculation (min)  49 min    Activity Tolerance  Patient tolerated treatment well    Behavior During Therapy  [facility] for tasks assessed/performed       Past Medical History:  Diagnosis Date  . Anemia   . Anemia 08/04/2017  . Anemia 08/04/2017  . Anxiety   . Carpal tunnel syndrome   . Carpal tunnel syndrome   . Cataract cortical, senile   . Cervical spinal cord compression ([place])   . Chicken pox   . Colitis   . Complication of anesthesia   . Depression   . Dizziness   . DJD (degenerative joint disease)   . Dysphagia   . GERD (gastroesophageal reflux disease)   . Hemorrhoids   . History of hiatal hernia   . History of palpitations    EMOTIONAL PALPITATIONS  . IBS (irritable bowel syndrome)   . Iritis   . Migraines   . Neuropathy   . OCD (obsessive compulsive disorder)   . Osteoarthritis   . Osteoporosis   . Panic attacks   . PONV (postoperative nausea and vomiting)   . Psoriasis   . Psoriasis   . Redundant colon   . REM sleep behavior disorder   . Sleep apnea   . Sleep disorder    REM SLEEP DISORDER  . Slow transit constipation   . Spondylosis    CERVICAL,SLEEPS WITH HOB ELEVATED AND WEARS NECK SUPPORT  . Spondylosis of cervical joint   . Spondylosis of cervical joint     Past Surgical History:  Procedure Laterality Date  . ABDOMINAL HYSTERECTOMY    . BACK SURGERY     CERVICAL FUSION 2003/DECOMPRESSION 2014 LUMBAR LAM 2009  . CATARACT EXTRACTION W/PHACO Right  12/08/2015   Procedure: CATARACT EXTRACTION PHACO AND INTRAOCULAR LENS PLACEMENT (IOC);  Surgeon: Birder Robson, MD;  Location: [facility] ORS;  Service: Ophthalmology;  Laterality: Right;  [place] 01:09 AP% 22.6 CDE 15.81 fluid pack lot # [id number]  . COLONOSCOPY    . COLONOSCOPY WITH PROPOFOL N/A 11/15/2017   Procedure: COLONOSCOPY WITH PROPOFOL;  Surgeon: Manya Silvas, MD;  Location: [facility] ENDOSCOPY;  Service: Endoscopy;  Laterality: N/A;  . ESOPHAGOGASTRODUODENOSCOPY    . ESOPHAGOGASTRODUODENOSCOPY N/A 11/15/2017   Procedure: ESOPHAGOGASTRODUODENOSCOPY (EGD);  Surgeon: Manya Silvas, MD;  Location: [facility] ENDOSCOPY;  Service: Endoscopy;  Laterality: N/A;  . EYE SURGERY    . FOOT FUSION Bilateral   . HAND SURGERY    . SPINE SURGERY    . TONSILLECTOMY    . 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  Subjective Assessment - 04/11/18 1448    Subjective   That little finger splint is so tight - I did not wear it much - I did do my own wrap to thumb and hand - to keep thumb from triggering - pain is little  better - but triggering still the same     Patient Stated Goals  If I can get the pain in my L thumb better and for it not to trigger     Currently in Pain?  Yes    Pain Score  3     Pain Location  Finger (Comment which one)    Pain Orientation  Left    Pain Descriptors / Indicators  Aching    Pain Type  Acute pain        Done heat and ice for one rotation - repeat heat again 2 min /1 min prior to soft tissue mobs - CT spreads and MC spreads - including webspace   and soft tissue mobs to webspace  And gentle traction to thumb  Pt ed on joint protection - and modifications - and velcro bra to use palms   IP volar splint stretch and ed husband to do at home if - splint gets to tight  Cont thumb PA and RA AROM  And ocntrast  Ice massage to MP           Skin check done and pt ed on use and what to expect   OT Treatments/Exercises (OP) -  04/11/18 0001      Iontophoresis   Type of Iontophoresis  Dexamethasone    Location  L thumb MP A1pulley     Dose  small patch ,2.0 current -    Time  19      remove and skin check done after taking off patch        OT Education - 04/11/18 1451    Education Details  changes to HEP     Person(s) Educated  Patient;Spouse    Methods  Demonstration    Comprehension  Returned demonstration;Verbalized understanding       OT Short Term Goals - 04/05/18 2054      OT SHORT TERM GOAL #1   Title  Pain and triggering in L thumb decrease to less than 2/1o pain and less than 2 triggers a day     Baseline  triggering several times during session , and pain at the best 4/10     Time  3    Period  Weeks    Status  New    Target Date  04/26/18        OT Long Term Goals - 04/05/18 2054      OT LONG TERM GOAL #1   Title  Pt to be ind in HEP and using splint to decrease pain , triggering of L thumb     Baseline  no knowledge of HEP or splint     Time  3    Period  Weeks    Status  New    Target Date  04/26/18      OT LONG TERM GOAL #2   Title  L thumb AROM improve to  5th opposition with no triggering and lat grip increase 2 lbs at least     Baseline  pain with last grip and increase pain - and opposition to 2nd fold of 5th with triggering     Time  4    Period  Weeks    Status  New    Target Date  05/03/18            Plan - 04/11/18 1451    Clinical Impression Statement  Pt IP volar splint was stretch and pt report that it feels much  better- pt pain decrease and tenderness over A1pulley - but pt report still being about 3/10 and triggering the same amount - did this date ionto - and pt ed on soft tissue mobs at home and joint protection     Occupational performance deficits (Please refer to evaluation for details):  ADL's;IADL's    Rehab Potential  Fair    Current Impairments/barriers affecting progress:  comorbitities     OT Frequency  2x / week    OT Duration  4  weeks    OT Treatment/Interventions  Self-care/ADL training;Therapeutic exercise;Patient/family education;Splinting;Iontophoresis;Ultrasound;Manual Therapy    Plan  assess progress with homeprogram and skcin check after ionto     Clinical Decision Making  Several treatment options, min-mod task modification necessary    OT Home Exercise Plan  see pt instruction     Consulted and Agree with Plan of Care  Patient       Patient will benefit from skilled therapeutic intervention in order to improve the following deficits and impairments:  Impaired flexibility, Pain, Impaired UE functional use, Decreased strength, Decreased range of motion  Visit Diagnosis: Pain in joint of left hand  Stiffness of left hand, not elsewhere classified  Trigger finger of left thumb    Problem List Patient Active Problem List   Diagnosis Date Noted  . 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 Urinary system disease 06/28/2013  . 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 FOM (frequency of micturition) 06/28/2013  . Cervical spinal cord compression ([place]) 11/28/2012  . Amnesia 11/09/2010    Rosalyn Gess OTR/L,CLT 04/11/2018, 2:54 PM  [place] [facility] [address] [place], [address] Phone: [phone]   Fax:  [phone]  Name: JACQUIE LUKES MRN: [id number] Date of Birth: 25-Nov-1940

## 2018-04-11 NOTE — Patient Instructions (Signed)
Splint was stretch and pt to wear at night time and during day as needed   Ed pt and husband to do some CT spreads, webspace massage and gentle traction on thumb  1o reps   2 x day  Cont joint mobs

## 2018-04-13 ENCOUNTER — Ambulatory Visit: Payer: Medicare Other | Admitting: Occupational Therapy

## 2018-04-13 DIAGNOSIS — M25642 Stiffness of left hand, not elsewhere classified: Secondary | ICD-10-CM

## 2018-04-13 DIAGNOSIS — M25542 Pain in joints of left hand: Secondary | ICD-10-CM | POA: Diagnosis not present

## 2018-04-13 DIAGNOSIS — M65312 Trigger thumb, left thumb: Secondary | ICD-10-CM

## 2018-04-13 NOTE — Patient Instructions (Signed)
Cont with same HEP - wear during day CMC neoprene - in combination with IP block splint with act that cause thumb CMC pain  And night time only IP one

## 2018-04-13 NOTE — Therapy (Signed)
[place] [facility] [address] [place], [address] Phone: [phone]   Fax:  [phone]  Occupational Therapy Treatment  Patient Details  Name: Sandra Mcconnell MRN: [id number] Date of Birth: 1941/08/04 Referring Provider: Jefm Bryant    Encounter Date: 04/13/2018  OT End of Session - 04/13/18 1223    Visit Number  3    Number of Visits  8    Date for OT Re-Evaluation  05/03/18    OT Start Time  1024    OT Stop Time  1114    OT Time Calculation (min)  50 min    Activity Tolerance  Patient tolerated treatment well    Behavior During Therapy  [facility] for tasks assessed/performed       Past Medical History:  Diagnosis Date  . Anemia   . Anemia 08/04/2017  . Anemia 08/04/2017  . Anxiety   . Carpal tunnel syndrome   . Carpal tunnel syndrome   . Cataract cortical, senile   . 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 Chicken pox   . Colitis   . Complication of anesthesia   . Depression   . Dizziness   . DJD (degenerative joint disease)   . Dysphagia   . GERD (gastroesophageal reflux disease)   . Hemorrhoids   . History of hiatal hernia   . History of palpitations    EMOTIONAL PALPITATIONS  . IBS (irritable bowel syndrome)   . Iritis   . Migraines   . Neuropathy   . OCD (obsessive compulsive disorder)   . Osteoarthritis   . Osteoporosis   . Panic attacks   . PONV (postoperative nausea and vomiting)   . Psoriasis   . Psoriasis   . Redundant colon   . REM sleep behavior disorder   . Sleep apnea   . Sleep disorder    REM SLEEP DISORDER  . Slow transit constipation   . Spondylosis    CERVICAL,SLEEPS WITH HOB ELEVATED AND WEARS NECK SUPPORT  . Spondylosis of cervical joint   . Spondylosis of cervical joint     Past Surgical History:  Procedure Laterality Date  . ABDOMINAL HYSTERECTOMY    . BACK SURGERY     CERVICAL FUSION 2003/DECOMPRESSION 2014 LUMBAR LAM 2009  . CATARACT EXTRACTION W/PHACO Right  12/08/2015   Procedure: CATARACT EXTRACTION PHACO AND INTRAOCULAR LENS PLACEMENT (IOC);  Surgeon: Birder Robson, MD;  Location: [facility] ORS;  Service: Ophthalmology;  Laterality: Right;  [place] 01:09 AP% 22.6 CDE 15.81 fluid pack lot # [id number]  . COLONOSCOPY    . COLONOSCOPY WITH PROPOFOL N/A 11/15/2017   Procedure: COLONOSCOPY WITH PROPOFOL;  Surgeon: Manya Silvas, MD;  Location: [facility] ENDOSCOPY;  Service: Endoscopy;  Laterality: N/A;  . ESOPHAGOGASTRODUODENOSCOPY    . ESOPHAGOGASTRODUODENOSCOPY N/A 11/15/2017   Procedure: ESOPHAGOGASTRODUODENOSCOPY (EGD);  Surgeon: Manya Silvas, MD;  Location: [facility] END[facility]COPY;  Service: Endoscopy;  Laterality: N/A;  . EYE SURGERY    . FOOT FUSION Bilateral   . HAND SURGERY    . SPINE SURGERY    . TONSILLECTOMY    . 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  Subjective Assessment - 04/13/18 1137    Subjective   Still triggering - and tender - but doing okay - but splint fitting better and I am trying not to use my thumb to much but more my thumb - but palm  Patient Stated Goals  If I can get the pain in my L thumb better and for it not to trigger     Currently in Pain?  Yes    Pain Score  3     Pain Location  Finger (Comment which one) Thumb    Pain Orientation  Left    Pain Descriptors / Indicators  Tender    Pain Type  Acute pain    Pain Onset  More than a month ago        tissue mobs - CT spreads and MC spreads - including webspace   and soft tissue mobs to webspace  And gentle traction to thumb  Pt ed on joint protection - and modifications - and velcro bra to use palms   IP volar splint stretch and ed husband to do at home if - splint gets to tight  Cont thumb PA and RA AROM  And ocntrast  Ice massage to MP    Skin check done and pt ed on use and what to expe            OT Treatments/Exercises (OP) - 04/13/18 0001      Iontophoresis   Type of Iontophoresis  Dexamethasone     Location  L thumb MP A1pulley     Dose  small patch ,2.0 current -    Time  19      LUE Contrast Bath   Time  11 minutes    Comments  prior to soft tissue mobs        Soft tissue mobs - CT spreads and MC spreads - including webspace   and soft tissue mobs to webspace after contrast And gentle traction to thumb  Pt ed on joint protection - and modifications -  And review with husband again to do at home soft tissue mobs  Fitted with CMC neoprene splint - for CMC pain - during act that requires static and sustained grip   Icont to use IP splint  Cont thumb PA and RA AROM  And ocntrast  Ice massage to MP    Skin check done prior to ionto  To keep patch on for hour afterwards        OT Education - 04/13/18 1223    Education Details  wearing of splints  and Ionto use     Person(s) Educated  Patient;Spouse    Methods  Explanation;Demonstration    Comprehension  Verbalized understanding;Returned demonstration       OT Short Term Goals - 04/05/18 2054      OT SHORT TERM GOAL #1   Title  Pain and triggering in L thumb decrease to less than 2/1o pain and less than 2 triggers a day     Baseline  triggering several times during session , and pain at the best 4/10     Time  3    Period  Weeks    Status  New    Target Date  04/26/18        OT Long Term Goals - 04/05/18 2054      OT LONG TERM GOAL #1   Title  Pt to be ind in HEP and using splint to decrease pain , triggering of L thumb     Baseline  no knowledge of HEP or splint     Time  3    Period  Weeks    Status  New    Target Date  04/26/18  OT LONG TERM GOAL #2   Title  L thumb AROM improve to  5th opposition with no triggering and lat grip increase 2 lbs at least     Baseline  pain with last grip and increase pain - and opposition to 2nd fold of 5th with triggering     Time  4    Period  Weeks    Status  New    Target Date  05/03/18            Plan - 04/13/18 1223    Clinical Impression  Statement  Pt cont to show triggering of L thumb and report some pain still and tenderness- also some pain at [facility] - provided adn fitted with [place] neoprenes splint to use with act - and that cont ionto and use of IP splint     Occupational performance deficits (Please refer to evaluation for details):  ADL's;IADL's    Rehab Potential  Fair    Current Impairments/barriers affecting progress:  comorbitities     OT Frequency  2x / week    OT Duration  4 weeks    OT Treatment/Interventions  Self-care/ADL training;Therapeutic exercise;Patient/family education;Splinting;Iontophoresis;Ultrasound;Manual Therapy    Plan  assess progress with homeprogram and 3rd  ionto     Clinical Decision Making  Several treatment options, min-mod task modification necessary    OT Home Exercise Plan  see pt instruction     Consulted and Agree with Plan of Care  Patient       Patient will benefit from skilled therapeutic intervention in order to improve the following deficits and impairments:  Impaired flexibility, Pain, Impaired UE functional use, Decreased strength, Decreased range of motion  Visit Diagnosis: Pain in joint of left hand  Stiffness of left hand, not elsewhere classified  Trigger finger of left thumb    Problem List Patient Active Problem List   Diagnosis Date Noted  . 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 Urinary system disease 06/28/2013  . 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 FOM (frequency of micturition) 06/28/2013  . Cervical spinal cord compression ([place]) 11/28/2012  . Amnesia 11/09/2010    Rosalyn Gess OTR/L,CLT 04/13/2018, 12:26 PM  [place] [facility] [address], [place], [address] Phone: [phone]   Fax:  [phone]  Name: Sandra Mcconnell MRN: [id number] Date of Birth: 1941/05/08

## 2018-04-17 ENCOUNTER — Encounter: Payer: Self-pay | Admitting: Occupational Therapy

## 2018-04-17 ENCOUNTER — Ambulatory Visit: Payer: Medicare Other | Admitting: Occupational Therapy

## 2018-04-17 DIAGNOSIS — M65312 Trigger thumb, left thumb: Secondary | ICD-10-CM

## 2018-04-17 DIAGNOSIS — M25542 Pain in joints of left hand: Secondary | ICD-10-CM | POA: Diagnosis not present

## 2018-04-17 DIAGNOSIS — M25642 Stiffness of left hand, not elsewhere classified: Secondary | ICD-10-CM

## 2018-04-20 ENCOUNTER — Ambulatory Visit: Payer: Medicare Other | Admitting: Occupational Therapy

## 2018-04-20 DIAGNOSIS — M25542 Pain in joints of left hand: Secondary | ICD-10-CM | POA: Diagnosis not present

## 2018-04-20 DIAGNOSIS — M25642 Stiffness of left hand, not elsewhere classified: Secondary | ICD-10-CM

## 2018-04-20 DIAGNOSIS — M65312 Trigger thumb, left thumb: Secondary | ICD-10-CM

## 2018-04-24 ENCOUNTER — Ambulatory Visit: Payer: Medicare Other | Admitting: Occupational Therapy

## 2018-04-24 DIAGNOSIS — M25642 Stiffness of left hand, not elsewhere classified: Secondary | ICD-10-CM

## 2018-04-24 DIAGNOSIS — M25542 Pain in joints of left hand: Secondary | ICD-10-CM | POA: Diagnosis not present

## 2018-04-24 DIAGNOSIS — M65312 Trigger thumb, left thumb: Secondary | ICD-10-CM

## 2018-04-26 ENCOUNTER — Ambulatory Visit: Payer: Medicare Other | Admitting: Occupational Therapy

## 2018-04-27 ENCOUNTER — Encounter: Payer: Self-pay | Admitting: Occupational Therapy

## 2018-04-27 NOTE — Therapy (Signed)
[place] [facility] [address] [place], [address] Phone: [phone]   Fax:  [phone]  Occupational Therapy Treatment  Patient Details  Name: Sandra Mcconnell MRN: [id number] Date of Birth: [birth year] Referring Provider: Jefm Bryant    Encounter Date: 04/20/2018  OT End of Session - 04/27/18 2158    Visit Number  5    Number of Visits  8    Date for OT Re-Evaluation  05/03/18    OT Start Time  1030    OT Stop Time  1125    OT Time Calculation (min)  55 min    Activity Tolerance  Patient tolerated treatment well    Behavior During Therapy  [facility] for tasks assessed/performed       Past Medical History:  Diagnosis Date  . Anemia   . Anemia 08/04/2017  . Anemia 08/04/2017  . Anxiety   . Carpal tunnel syndrome   . Carpal tunnel syndrome   . Cataract cortical, senile   . 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 Chicken pox   . Colitis   . Complication of anesthesia   . Depression   . Dizziness   . DJD (degenerative joint disease)   . Dysphagia   . GERD (gastroesophageal reflux disease)   . Hemorrhoids   . History of hiatal hernia   . History of palpitations    EMOTIONAL PALPITATIONS  . IBS (irritable bowel syndrome)   . Iritis   . Migraines   . Neuropathy   . OCD (obsessive compulsive disorder)   . Osteoarthritis   . Osteoporosis   . Panic attacks   . PONV (postoperative nausea and vomiting)   . Psoriasis   . Psoriasis   . Redundant colon   . REM sleep behavior disorder   . Sleep apnea   . Sleep disorder    REM SLEEP DISORDER  . Slow transit constipation   . Spondylosis    CERVICAL,SLEEPS WITH HOB ELEVATED AND WEARS NECK SUPPORT  . Spondylosis of cervical joint   . Spondylosis of cervical joint     Past Surgical History:  Procedure Laterality Date  . ABDOMINAL HYSTERECTOMY    . BACK SURGERY     CERVICAL FUSION 2003/DECOMPRESSION 2014 LUMBAR LAM 2009  . CATARACT EXTRACTION W/PHACO  Right 12/08/2015   Procedure: CATARACT EXTRACTION PHACO AND INTRAOCULAR LENS PLACEMENT (IOC);  Surgeon: Birder Robson, MD;  Location: [facility] ORS;  Service: Ophthalmology;  Laterality: Right;  [place] 01:09 AP% 22.6 CDE 15.81 fluid pack lot # [id number]  . COLONOSCOPY    . COLONOSCOPY WITH PROPOFOL N/A 11/15/2017   Procedure: COLONOSCOPY WITH PROPOFOL;  Surgeon: Manya Silvas, MD;  Location: [facility] ENDOSCOPY;  Service: Endoscopy;  Laterality: N/A;  . ESOPHAGOGASTRODUODENOSCOPY    . ESOPHAGOGASTRODUODENOSCOPY N/A 11/15/2017   Procedure: ESOPHAGOGASTRODUODENOSCOPY (EGD);  Surgeon: Manya Silvas, MD;  Location[facility] ENDOSCOPY;  Service: Endoscopy;  Laterality: N/A;  . EYE SURGERY    . FOOT FUSION Bilateral   . HAND SURGERY    . SPINE SURGERY    . TONSILLECTOMY    . 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  Subjective Assessment - 04/27/18 2153    Subjective   Patient reports she is still having issues with triggering but reports she feels like it is less often and doing slightly better.  She reports her splint is working well and when she wears it,  her thumb does not trigger.     Patient Stated Goals  If I can get the pain in my L thumb better and for it not to trigger     Currently in Pain?  Yes    Pain Score  5     Pain Location  Finger (Comment which one)    Pain Orientation  Left    Pain Descriptors / Indicators  Aching    Pain Type  Chronic pain    Pain Onset  More than a month ago    Pain Frequency  Constant       Patient seen this date for the following:   Contrast outlined in flow sheet followed by Soft tissue mobs - CT spreads and MC spreads - including webspace and gently around thumb  and soft tissue mobs to webspace, gentle traction to thumb  Thumb PA and RA AROM with cues Ionto as outlined in flow sheet  Continue to use IP splint  Cont thumb PA and RA AROM at home Contrast at home  Ice massage to MP     Skin check done prior to  ionto  To keep patch on for hour afterwards              OT Treatments/Exercises (OP) - 04/27/18 2154      Iontophoresis   Type of Iontophoresis  Dexamethasone    Location  L thumb MP A1pulley     Dose  small patch ,2.0 current -    Time  18      LUE Contrast Bath   Time  11 minutes    Comments  Performed prior to soft tissue mobs             OT Education - 04/27/18 2157    Education Details  HEP, contrast    Person(s) Educated  Patient;Spouse    Methods  Explanation;Demonstration    Comprehension  Verbalized understanding;Returned demonstration       OT Short Term Goals - 04/05/18 2054      OT SHORT TERM GOAL #1   Title  Pain and triggering in L thumb decrease to less than 2/1o pain and less than 2 triggers a day     Baseline  triggering several times during session , and pain at the best 4/10     Time  3    Period  Weeks    Status  New    Target Date  04/26/18        OT Long Term Goals - 04/05/18 2054      OT LONG TERM GOAL #1   Title  Pt to be ind in HEP and using splint to decrease pain , triggering of L thumb     Baseline  no knowledge of HEP or splint     Time  3    Period  Weeks    Status  New    Target Date  04/26/18      OT LONG TERM GOAL #2   Title  L thumb AROM improve to  5th opposition with no triggering and lat grip increase 2 lbs at least     Baseline  pain with last grip and increase pain - and opposition to 2nd fold of 5th with triggering     Time  4    Period  Weeks    Status  New    Target Date  05/03/18            Plan -  04/27/18 2159    Clinical Impression Statement  Although patient continues to report some triggering in the left thumb, she is progressing and pain decreased.  She is tolerating ionto well and continues to wear her splint and wears brace at night.  She reports manual techniques/massage help to alleviate some pain.  Patient continues to benefit from skilled OT to maximize independence in daily tasks.      Occupational performance deficits (Please refer to evaluation for details):  ADL's;IADL's    Rehab Potential  Fair    OT Frequency  2x / week    OT Duration  4 weeks    OT Treatment/Interventions  Self-care/ADL training;Therapeutic exercise;Patient/family education;Splinting;Iontophoresis;Ultrasound;Manual Therapy    Consulted and Agree with Plan of Care  Patient       Patient will benefit from skilled therapeutic intervention in order to improve the following deficits and impairments:  Impaired flexibility, Pain, Impaired UE functional use, Decreased strength, Decreased range of motion  Visit Diagnosis: Pain in joint of left hand  Stiffness of left hand, not elsewhere classified  Trigger finger of left thumb    Problem List Patient Active Problem List   Diagnosis Date Noted  . 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 Urinary system disease 06/28/2013  . 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 FOM (frequency of micturition) 06/28/2013  . Cervical spinal cord compression ([place]) 11/28/2012  . Amnesia 11/09/2010   Sandra Mcconnell, OTR/L, CLT  Sandra Mcconnell 04/27/2018, 10:03 PM  [place] [facility] [address], [place], [address] Phone: [phone]   Fax:  [phone]  Name: Sandra Mcconnell MRN: [id number] Date of Birth: [birth year]

## 2018-04-27 NOTE — Therapy (Signed)
[place] [facility] [address] [place], [address] Phone: [phone]   Fax:  [phone]  Occupational Therapy Treatment  Patient Details  Name: Sandra Mcconnell MRN: [id number] Date of Birth: 1941-05-29 Referring Provider: Jefm Bryant    Encounter Date: 04/17/2018  OT End of Session - 04/27/18 1937    Visit Number  4    Number of Visits  8    Date for OT Re-Evaluation  05/03/18    OT Start Time  1300    OT Stop Time  1352    OT Time Calculation (min)  52 min    Activity Tolerance  Patient tolerated treatment well    Behavior During Therapy  [facility] for tasks assessed/performed       Past Medical History:  Diagnosis Date  . Anemia   . Anemia 08/04/2017  . Anemia 08/04/2017  . Anxiety   . Carpal tunnel syndrome   . Carpal tunnel syndrome   . Cataract cortical, senile   . 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 Chicken pox   . Colitis   . Complication of anesthesia   . Depression   . Dizziness   . DJD (degenerative joint disease)   . Dysphagia   . GERD (gastroesophageal reflux disease)   . Hemorrhoids   . History of hiatal hernia   . History of palpitations    EMOTIONAL PALPITATIONS  . IBS (irritable bowel syndrome)   . Iritis   . Migraines   . Neuropathy   . OCD (obsessive compulsive disorder)   . Osteoarthritis   . Osteoporosis   . Panic attacks   . PONV (postoperative nausea and vomiting)   . Psoriasis   . Psoriasis   . Redundant colon   . REM sleep behavior disorder   . Sleep apnea   . Sleep disorder    REM SLEEP DISORDER  . Slow transit constipation   . Spondylosis    CERVICAL,SLEEPS WITH HOB ELEVATED AND WEARS NECK SUPPORT  . Spondylosis of cervical joint   . Spondylosis of cervical joint     Past Surgical History:  Procedure Laterality Date  . ABDOMINAL HYSTERECTOMY    . BACK SURGERY     CERVICAL FUSION 2003/DECOMPRESSION 2014 LUMBAR LAM 2009  . CATARACT EXTRACTION W/PHACO  Right 12/08/2015   Procedure: CATARACT EXTRACTION PHACO AND INTRAOCULAR LENS PLACEMENT (IOC);  Surgeon: Birder Robson, MD;  Location: [facility] ORS;  Service: Ophthalmology;  Laterality: Right;  [place] 01:09 AP% 22.6 CDE 15.81 fluid pack lot # [id number]  . COLONOSCOPY    . COLONOSCOPY WITH PROPOFOL N/A 11/15/2017   Procedure: COLONOSCOPY WITH PROPOFOL;  Surgeon: Manya Silvas, MD;  [facility] ENDOSCOPY;  Service: Endoscopy;  Laterality: N/A;  . ESOPHAGOGASTRODUODENOSCOPY    . ESOPHAGOGASTRODUODENOSCOPY N/A 11/15/2017   Procedure: ESOPHAGOGASTRODUODENOSCOPY (EGD);  Surgeon: Manya Silvas, MD;  Location: [facility] ENDOSCOPY;  Service: Endoscopy;  Laterality: N/A;  . EYE SURGERY    . FOOT FUSION Bilateral   . HAND SURGERY    . SPINE SURGERY    . TONSILLECTOMY    . 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  Subjective Assessment - 04/27/18 1935    Subjective   Patient reports her finger is still triggering and she would like to be able to wear the hard splint on the thumb and wear the thumb CMC splint with it.    Patient Stated  Goals  If I can get the pain in my L thumb better and for it not to trigger     Currently in Pain?  Yes    Pain Score  6     Pain Location  Finger (Comment which one)    Pain Orientation  Left    Pain Descriptors / Indicators  Aching    Pain Type  Chronic pain    Pain Onset  More than a month ago    Pain Frequency  Constant    Multiple Pain Sites  No      Patient seen this date for the following:   Contrast outlined in flow sheet followed by  Soft tissue mobs - CT spreads and MC spreads - including webspace  and soft tissue mobs to webspace, gentle traction to thumb  Fabricated small thumb splint for nighttime to wear under old CMC neoprene brace to decrease triggering.  Reviewed with husband mobs to thumb for home  Thumb PA and RA AROM Continue to use IP splint  Cont thumb PA and RA AROM  Contrast at home  Ice massage  to MP   Skin check done prior to ionto  To keep patch on for hour afterwards               OT Treatments/Exercises (OP) - 04/27/18 0001      Iontophoresis   Type of Iontophoresis  Dexamethasone    Location  L thumb MP A1pulley     Dose  small patch ,2.0 current -    Time  18      LUE Contrast Bath   Time  11 minutes    Comments  Performed prior to soft tissue mobs             OT Education - 04/27/18 1937    Education Details  splints for home    Person(s) Educated  Patient;Spouse    Methods  Explanation;Demonstration    Comprehension  Verbalized understanding;Returned demonstration       OT Short Term Goals - 04/05/18 2054      OT SHORT TERM GOAL #1   Title  Pain and triggering in L thumb decrease to less than 2/1o pain and less than 2 triggers a day     Baseline  triggering several times during session , and pain at the best 4/10     Time  3    Period  Weeks    Status  New    Target Date  04/26/18        OT Long Term Goals - 04/05/18 2054      OT LONG TERM GOAL #1   Title  Pt to be ind in HEP and using splint to decrease pain , triggering of L thumb     Baseline  no knowledge of HEP or splint     Time  3    Period  Weeks    Status  New    Target Date  04/26/18      OT LONG TERM GOAL #2   Title  L thumb AROM improve to  5th opposition with no triggering and lat grip increase 2 lbs at least     Baseline  pain with last grip and increase pain - and opposition to 2nd fold of 5th with triggering     Time  4    Period  Weeks    Status  New    Target Date  05/03/18  Patient will benefit from skilled therapeutic intervention in order to improve the following deficits and impairments:  Impaired flexibility, Pain, Impaired UE functional use, Decreased strength, Decreased range of motion  Visit Diagnosis: Pain in joint of left hand  Stiffness of left hand, not elsewhere classified  Trigger finger of left  thumb    Problem List Patient Active Problem List   Diagnosis Date Noted  . 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 Urinary system disease 06/28/2013  . 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 FOM (frequency of micturition) 06/28/2013  . Cervical spinal cord compression ([place]) 11/28/2012  . Amnesia 11/09/2010   Aradhana Gin T Tomasita Morrow, OTR/L, CLT  [place] 04/27/2018, 7:43 PM  [place] [facility] [address] [place], [address] Phone: [phone]   Fax:  [phone]  Name: Sandra Mcconnell MRN: [id number] Date of Birth: September 06, 1941

## 2018-04-28 ENCOUNTER — Encounter: Payer: Self-pay | Admitting: Occupational Therapy

## 2018-04-28 NOTE — Therapy (Signed)
[facility] [address] [place], [address] Phone: [phone]   Fax:  [phone]  Occupational Therapy Treatment  Patient Details  Name: Sandra Mcconnell MRN: [id number] Date of Birth: August 21, 1941 Referring Provider: Jefm Bryant    Encounter Date: 04/24/2018  OT End of Session - 04/28/18 0826    Visit Number  6    Number of Visits  8    Date for OT Re-Evaluation  05/03/18    OT Start Time  1400    OT Stop Time  1445    OT Time Calculation (min)  45 min    Activity Tolerance  Patient tolerated treatment well    Behavior During Therapy  [facility] for tasks assessed/performed       Past Medical History:  Diagnosis Date  . Anemia   . Anemia 08/04/2017  . Anemia 08/04/2017  . Anxiety   . Carpal tunnel syndrome   . Carpal tunnel syndrome   . Cataract cortical, senile   . 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 Chicken pox   . Colitis   . Complication of anesthesia   . Depression   . Dizziness   . DJD (degenerative joint disease)   . Dysphagia   . GERD (gastroesophageal reflux disease)   . Hemorrhoids   . History of hiatal hernia   . History of palpitations    EMOTIONAL PALPITATIONS  . IBS (irritable bowel syndrome)   . Iritis   . Migraines   . Neuropathy   . OCD (obsessive compulsive disorder)   . Osteoarthritis   . Osteoporosis   . Panic attacks   . PONV (postoperative nausea and vomiting)   . Psoriasis   . Psoriasis   . Redundant colon   . REM sleep behavior disorder   . Sleep apnea   . Sleep disorder    REM SLEEP DISORDER  . Slow transit constipation   . Spondylosis    CERVICAL,SLEEPS WITH HOB ELEVATED AND WEARS NECK SUPPORT  . Spondylosis of cervical joint   . Spondylosis of cervical joint     Past Surgical History:  Procedure Laterality Date  . ABDOMINAL HYSTERECTOMY    . BACK SURGERY     CERVICAL FUSION 2003/DECOMPRESSION 2014 LUMBAR LAM 2009  . CATARACT EXTRACTION W/PHACO  Right 12/08/2015   Procedure: CATARACT EXTRACTION PHACO AND INTRAOCULAR LENS PLACEMENT (IOC);  Surgeon: Birder Robson, MD;  Location: [facility] ORS;  Service: Ophthalmology;  Laterality: Right;  [place] 01:09 AP% 22.6 CDE 15.81 fluid pack lot # [id number]  . COLONOSCOPY    . COLONOSCOPY WITH PROPOFOL N/A 11/15/2017   Procedure: COLONOSCOPY WITH PROPOFOL;  Surgeon: Manya Silvas, MD;  Location: [facility] ENDOSCOPY;  Service: Endoscopy;  Laterality: N/A;  . ESOPHAGOGASTRODUODENOSCOPY    . ESOPHAGOGASTRODUODENOSCOPY N/A 11/15/2017   Procedure: ESOPHAGOGASTRODUODENOSCOPY (EGD);  Surgeon: Manya Silvas, MD;  Location: [facility] ENDOSCOPY;  Service: Endoscopy;  Laterality: N/A;  . EYE SURGERY    . FOOT FUSION Bilateral   . HAND SURGERY    . SPINE SURGERY    . TONSILLECTOMY    . 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  Subjective Assessment - 04/28/18 7893    Subjective   Patient reports the frequency of her triggering has been less, requesting for therapist to do 1/2 of contrast and then manual techniques/massage and then complete the other 1/2, states, "that feels better when we do  that."    Patient Stated Goals  If I can get the pain in my L thumb better and for it not to trigger     Currently in Pain?  Yes    Pain Score  5     Pain Location  Finger (Comment which one)    Pain Orientation  Left    Pain Descriptors / Indicators  Aching    Pain Onset  More than a month ago    Pain Frequency  Constant       Patient seen this date for the following:  Contrast outlined in flow sheet followed bySofttissue mobs - CT spreads and MC spreads - including webspace and gently around thumb and soft tissue mobs to webspace,gentle traction to thumb Thumb PA and RA AROMwith cues Ionto as outlined in flow sheet  Continueto use IP splint Cont thumb PA and RA AROMat home Contrast at home Ice massage to MP   Skin check doneprior to ionto  To keep patch on  for hour afterwards              OT Treatments/Exercises (OP) - 04/28/18 0825      Iontophoresis   Type of Iontophoresis  Dexamethasone    Location  L thumb MP A1pulley     Dose  small patch ,2.0 current     Time  18      LUE Contrast Bath   Time  11 minutes    Comments  Performed prior to soft tissue mobs             OT Education - 04/28/18 0826    Education Details  HEP, 5th ionto visit and suggestion to spread out the last remaining visits to one time a week.    Person(s) Educated  Patient    Methods  Explanation;Demonstration    Comprehension  Verbalized understanding;Returned demonstration       OT Short Term Goals - 04/05/18 2054      OT SHORT TERM GOAL #1   Title  Pain and triggering in L thumb decrease to less than 2/1o pain and less than 2 triggers a day     Baseline  triggering several times during session , and pain at the best 4/10     Time  3    Period  Weeks    Status  New    Target Date  04/26/18        OT Long Term Goals - 04/05/18 2054      OT LONG TERM GOAL #1   Title  Pt to be ind in HEP and using splint to decrease pain , triggering of L thumb     Baseline  no knowledge of HEP or splint     Time  3    Period  Weeks    Status  New    Target Date  04/26/18      OT LONG TERM GOAL #2   Title  L thumb AROM improve to  5th opposition with no triggering and lat grip increase 2 lbs at least     Baseline  pain with last grip and increase pain - and opposition to 2nd fold of 5th with triggering     Time  4    Period  Weeks    Status  New    Target Date  05/03/18            Plan - 04/28/18 0827    Clinical Impression Statement  Patient  feels she is progressing with decrease frequency of triggering of left thumb but still present at times. Discussed plan of care, this is her 5th ionto treatment, activity at home and home program. Recommend we spread out remaining treatments to one time a week and patient agrees and likes this  idea.  Patient continues to progress, will monitor pain and continue to make adjustments to program each treatment session.     Occupational performance deficits (Please refer to evaluation for details):  ADL's;IADL's    Rehab Potential  Fair    Current Impairments/barriers affecting progress:  comorbitities     OT Frequency  2x / week    OT Duration  4 weeks    OT Treatment/Interventions  Self-care/ADL training;Therapeutic exercise;Patient/family education;Splinting;Iontophoresis;Ultrasound;Manual Therapy    Consulted and Agree with Plan of Care  Patient       Patient will benefit from skilled therapeutic intervention in order to improve the following deficits and impairments:  Impaired flexibility, Pain, Impaired UE functional use, Decreased strength, Decreased range of motion  Visit Diagnosis: Pain in joint of left hand  Stiffness of left hand, not elsewhere classified  Trigger finger of left thumb    Problem List Patient Active Problem List   Diagnosis Date Noted  . 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 Urinary system disease 06/28/2013  . 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 FOM (frequency of micturition) 06/28/2013  . 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 Amnesia 11/09/2010   Shavonte Zhao T Tomasita Morrow, OTR/L, CLT  Navneet Schmuck 04/28/2018, 8:32 AM  [facility] [address] [place], [address] Phone: [phone]   Fax:  [phone]  Name: Sandra Mcconnell MRN: [id number] Date of Birth: [birth year]

## 2018-05-02 ENCOUNTER — Ambulatory Visit: Payer: Medicare Other | Admitting: Occupational Therapy

## 2018-05-02 DIAGNOSIS — M25642 Stiffness of left hand, not elsewhere classified: Secondary | ICD-10-CM

## 2018-05-02 DIAGNOSIS — M65312 Trigger thumb, left thumb: Secondary | ICD-10-CM

## 2018-05-02 DIAGNOSIS — M25542 Pain in joints of left hand: Secondary | ICD-10-CM | POA: Diagnosis not present

## 2018-05-03 ENCOUNTER — Encounter: Payer: Self-pay | Admitting: Occupational Therapy

## 2018-05-03 NOTE — Therapy (Signed)
[facility] [address], [place], [address] Phone: [phone]   Fax:  [phone]  Occupational Therapy Treatment  Patient Details  Name: Sandra Mcconnell MRN: [id number] Date of Birth: [birth year] Referring Provider: Jefm Bryant    Encounter Date: 05/02/2018  OT End of Session - 05/03/18 1959    Visit Number  7    Number of Visits  8    Date for OT Re-Evaluation  05/03/18    OT Start Time  0145    OT Stop Time  0215    OT Time Calculation (min)  30 min    Activity Tolerance  Patient tolerated treatment well    Behavior During Therapy  [facility] for tasks assessed/performed       Past Medical History:  Diagnosis Date  . Anemia   . Anemia 08/04/2017  . Anemia 08/04/2017  . Anxiety   . Carpal tunnel syndrome   . Carpal tunnel syndrome   . Cataract cortical, senile   . Cervical spinal cord compression ([place])   . Chicken pox   . Colitis   . Complication of anesthesia   . Depression   . Dizziness   . DJD (degenerative joint disease)   . Dysphagia   . GERD (gastroesophageal reflux disease)   . Hemorrhoids   . History of hiatal hernia   . History of palpitations    EMOTIONAL PALPITATIONS  . IBS (irritable bowel syndrome)   . Iritis   . Migraines   . Neuropathy   . OCD (obsessive compulsive disorder)   . Osteoarthritis   . Osteoporosis   . Panic attacks   . PONV (postoperative nausea and vomiting)   . Psoriasis   . Psoriasis   . Redundant colon   . REM sleep behavior disorder   . Sleep apnea   . Sleep disorder    REM SLEEP DISORDER  . Slow transit constipation   . Spondylosis    CERVICAL,SLEEPS WITH HOB ELEVATED AND WEARS NECK SUPPORT  . Spondylosis of cervical joint   . Spondylosis of cervical joint     Past Surgical History:  Procedure Laterality Date  . ABDOMINAL HYSTERECTOMY    . BACK SURGERY     CERVICAL FUSION 2003/DECOMPRESSION 2014 LUMBAR LAM 2009  . CATARACT EXTRACTION W/PHACO  Right 12/08/2015   Procedure: CATARACT EXTRACTION PHACO AND INTRAOCULAR LENS PLACEMENT (IOC);  Surgeon: Birder Robson, MD;  Location: [facility] ORS;  Service: Ophthalmology;  Laterality: Right;  [place] 01:09 AP% 22.6 CDE 15.81 fluid pack lot # [id number]  . COLONOSCOPY    . COLONOSCOPY WITH PROPOFOL N/A 11/15/2017   Procedure: COLONOSCOPY WITH PROPOFOL;  Surgeon: Manya Silvas, MD;  [facility];  Service: Endoscopy;  Laterality: N/A;  . ESOPHAGOGASTRODUODENOSCOPY    . ESOPHAGOGASTRODUODENOSCOPY N/A 11/15/2017   Procedure: ESOPHAGOGASTRODUODENOSCOPY (EGD);  Surgeon: Manya Silvas, MD;  Location: [facility] ENDOSCOPY;  Service: Endoscopy;  Laterality: N/A;  . EYE SURGERY    . FOOT FUSION Bilateral   . HAND SURGERY    . SPINE SURGERY    . TONSILLECTOMY    . 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  Subjective Assessment - 05/03/18 1956    Subjective   Patient reports her finger triggers less but she is wearing her brace most of the time, when she is without the brace it still triggers.  Pain has decreased to 3/10 and just feels achy  at times.  She reports some clicking at the thumb with thumb palmar ABD    Patient Stated Goals  If I can get the pain in my L thumb better and for it not to trigger     Currently in Pain?  Yes    Pain Score  3     Pain Location  Finger (Comment which one)    Pain Orientation  Left    Pain Descriptors / Indicators  Aching    Pain Type  Chronic pain    Pain Onset  More than a month ago    Pain Frequency  Constant    Multiple Pain Sites  No         Patient seen this date for the following:   Contrast outlined in flow sheet followed bySofttissue mobs - CT spreads and MC spreads - including webspace and gently around thumb and soft tissue mobs to webspace,gentle traction to thumb Thumb PA and RA AROMwith cues Ionto as outlined in flow sheet  Continueto use IP splint Cont thumb PA and RA AROMat  home Continue Contrast at [employer] massage to MP   Skin check doneprior to ionto  To keep patch on for hour afterwards                     OT Education - 05/03/18 1959    Education Details  HEP    Person(s) Educated  Patient    Methods  Explanation;Demonstration    Comprehension  Verbalized understanding;Returned demonstration       OT Short Term Goals - 04/05/18 2054      OT SHORT TERM GOAL #1   Title  Pain and triggering in L thumb decrease to less than 2/1o pain and less than 2 triggers a day     Baseline  triggering several times during session , and pain at the best 4/10     Time  3    Period  Weeks    Status  New    Target Date  04/26/18        OT Long Term Goals - 04/05/18 2054      OT LONG TERM GOAL #1   Title  Pt to be ind in HEP and using splint to decrease pain , triggering of L thumb     Baseline  no knowledge of HEP or splint     Time  3    Period  Weeks    Status  New    Target Date  04/26/18      OT LONG TERM GOAL #2   Title  L thumb AROM improve to  5th opposition with no triggering and lat grip increase 2 lbs at least     Baseline  pain with last grip and increase pain - and opposition to 2nd fold of 5th with triggering     Time  4    Period  Weeks    Status  New    Target Date  05/03/18            Plan - 05/03/18 2000    Clinical Impression Statement  Patient continues to have triggering of thumb if brace is not used.  She has difficulty with hair care with attempts to reach and scrunch her hair, she reports increased anxiety when she is going out over fixing her hair, she is currently considering a wig.  Patient's pain has decreased over time, now 3/10.  Continue to work towards  goals to decrease inflammation, decrease pain, increase motion, decrease triggering and improve independence in daily tasks.      Occupational performance deficits (Please refer to evaluation for details):  ADL's;IADL's    Rehab Potential   Fair    Current Impairments/barriers affecting progress:  comorbitities     OT Frequency  2x / week    OT Duration  4 weeks    OT Treatment/Interventions  Self-care/ADL training;Therapeutic exercise;Patient/family education;Splinting;Iontophoresis;Ultrasound;Manual Therapy    Consulted and Agree with Plan of Care  Patient       Patient will benefit from skilled therapeutic intervention in order to improve the following deficits and impairments:  Impaired flexibility, Pain, Impaired UE functional use, Decreased strength, Decreased range of motion  Visit Diagnosis: Pain in joint of left hand  Stiffness of left hand, not elsewhere classified  Trigger finger of left thumb    Problem List Patient Active Problem List   Diagnosis Date Noted  . 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 Urinary system disease 06/28/2013  . 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 FOM (frequency of micturition) 06/28/2013  . Cervical spinal cord compression ([place]) 11/28/2012  . Amnesia 11/09/2010   Camree Wigington T Tomasita Morrow, OTR/L, CLT  Riese Hellard 05/03/2018, 8:05 PM  [facility] [address] [place], [address] Phone: [phone]   Fax:  [phone]  Name: TYRONDA VIZCARRONDO MRN: [id number] Date of Birth: 11/12/1940

## 2018-05-07 ENCOUNTER — Ambulatory Visit: Payer: Medicare Other | Attending: Rheumatology | Admitting: Occupational Therapy

## 2018-05-07 DIAGNOSIS — M65312 Trigger thumb, left thumb: Secondary | ICD-10-CM | POA: Insufficient documentation

## 2018-05-07 DIAGNOSIS — M25542 Pain in joints of left hand: Secondary | ICD-10-CM | POA: Diagnosis not present

## 2018-05-07 DIAGNOSIS — M25642 Stiffness of left hand, not elsewhere classified: Secondary | ICD-10-CM | POA: Diagnosis present

## 2018-05-11 ENCOUNTER — Encounter: Payer: Self-pay | Admitting: Occupational Therapy

## 2018-05-11 NOTE — Therapy (Signed)
[facility] [address], [place], [address] Phone: [phone]   Fax:  [phone]  Occupational Therapy Treatment  Patient Details  Name: Sandra Mcconnell MRN: [id number] Date of Birth: Apr 11, 1941 Referring Provider: Jefm Bryant    Encounter Date: 05/07/2018  OT End of Session - 05/11/18 0815    Visit Number  8    Number of Visits  12    Date for OT Re-Evaluation  06/04/18    OT Start Time  1430    OT Stop Time  1515    OT Time Calculation (min)  45 min    Activity Tolerance  Patient tolerated treatment well    Behavior During Therapy  [facility] for tasks assessed/performed       Past Medical History:  Diagnosis Date  . Anemia   . Anemia 08/04/2017  . Anemia 08/04/2017  . Anxiety   . Carpal tunnel syndrome   . Carpal tunnel syndrome   . Cataract cortical, senile   . Cervical spinal cord compression ([place])   . Chicken pox   . Colitis   . Complication of anesthesia   . Depression   . Dizziness   . DJD (degenerative joint disease)   . Dysphagia   . GERD (gastroesophageal reflux disease)   . Hemorrhoids   . History of hiatal hernia   . History of palpitations    EMOTIONAL PALPITATIONS  . IBS (irritable bowel syndrome)   . Iritis   . Migraines   . Neuropathy   . OCD (obsessive compulsive disorder)   . Osteoarthritis   . Osteoporosis   . Panic attacks   . PONV (postoperative nausea and vomiting)   . Psoriasis   . Psoriasis   . Redundant colon   . REM sleep behavior disorder   . Sleep apnea   . Sleep disorder    REM SLEEP DISORDER  . Slow transit constipation   . Spondylosis    CERVICAL,SLEEPS WITH HOB ELEVATED AND WEARS NECK SUPPORT  . Spondylosis of cervical joint   . Spondylosis of cervical joint     Past Surgical History:  Procedure Laterality Date  . ABDOMINAL HYSTERECTOMY    . BACK SURGERY     CERVICAL FUSION 2003/DECOMPRESSION 2014 LUMBAR LAM 2009  . CATARACT EXTRACTION W/PHACO  Right 12/08/2015   Procedure: CATARACT EXTRACTION PHACO AND INTRAOCULAR LENS PLACEMENT (IOC);  Surgeon: Birder Robson, MD;  Location: [facility] ORS;  Service: Ophthalmology;  Laterality: Right;  [place] 01:09 AP% 22.6 CDE 15.81 fluid pack lot # [id number]  . COLONOSCOPY    . COLONOSCOPY WITH PROPOFOL N/A 11/15/2017   Procedure: COLONOSCOPY WITH PROPOFOL;  Surgeon: Manya Silvas, MD;  Location[facility] ENDOSCOPY;  Service: Endoscopy;  Laterality: N/A;  . ESOPHAGOGASTRODUODENOSCOPY    . ESOPHAGOGASTRODUODENOSCOPY N/A 11/15/2017   Procedure: ESOPHAGOGASTRODUODENOSCOPY (EGD);  Surgeon: Manya Silvas, MD;  Location: [facility] ENDOSCOPY;  Service: Endoscopy;  Laterality: N/A;  . EYE SURGERY    . FOOT FUSION Bilateral   . HAND SURGERY    . SPINE SURGERY    . TONSILLECTOMY    . 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  Subjective Assessment - 05/11/18 0813    Subjective   Patient reports she feels she is doing better at times but still having some of the triggering occuring.  Pain 3/10    Patient Stated Goals  If I can get the pain in  my L thumb better and for it not to trigger     Currently in Pain?  Yes    Pain Score  3     Pain Location  Finger (Comment which one)    Pain Orientation  Left    Pain Descriptors / Indicators  Aching    Pain Type  Chronic pain    Pain Onset  More than a month ago    Pain Frequency  Constant    Multiple Pain Sites  No        Patient seen this date for the following:  Skin checked prior to paraffin Paraffin to left hand and wrist followed by  Softtissue mobs - CT spreads and MC spreads - including webspace and gently around thumb and soft tissue mobs to webspace,gentle traction to thumb Thumb PA and RA AROMwith cues Patient able to demonstrate composite fisting after paraffin, no triggering of thumb.   After paraffin, patient went to wash her hands to get glycerin off prior to ionto tx however, after handwashing when  skin was inspected, she was noted to have a small pin prick area on the base of the left thumb in the crease that was slightly open and therefore she would not be able to have ionto treatment to this area today  Continueto use IP splint Cont thumb PA and RA AROMat home Continue Contrast at [employer] massage to MP                        OT Education - 05/11/18 0815    Education Details  home exercises    Person(s) Educated  Patient    Methods  Explanation;Demonstration    Comprehension  Verbalized understanding;Returned demonstration       OT Short Term Goals - 05/11/18 0821      OT SHORT TERM GOAL #1   Title  Pain and triggering in L thumb decrease to less than 2/1o pain and less than 2 triggers a day     Baseline  triggering several times during session , and pain at the best 4/10     Time  3    Period  Weeks    Status  On-going        OT Long Term Goals - 05/11/18 4010      OT LONG TERM GOAL #1   Title  Pt to be ind in HEP and using splint to decrease pain , triggering of L thumb     Baseline  no knowledge of HEP or splint     Time  3    Period  Weeks    Status  On-going      OT LONG TERM GOAL #2   Title  L thumb AROM improve to  5th opposition with no triggering and lat grip increase 2 lbs at least     Baseline  pain with last grip and increase pain - and opposition to 2nd fold of 5th with triggering     Time  4    Period  Weeks    Status  On-going            Plan - 05/11/18 2725    Clinical Impression Statement  Implemented paraffin bath this date to decrease pain and increase ROM.  Patient was pleased with result and could demonstrate composite fist after paraffin treatment and was excited since she has not been able to do this for a long time. Patient continues to  have triggering of thumb if brace is not used.  She is reporting decreased pain in hand and thumb area with treatment over time.  She has difficulty with hair care with  attempts to reach and scrunch her hair, she reports increased anxiety when she is going out over fixing her hair, she is currently considering a wig.    Patient seen for paraffin bath this date to decrease stiffness and pain, after paraffin, patient washed her hands to get the glycerin off prior to ionto tx however she had a small pin point open area at  the base of her thumb in the crease and therefore could not do ionto tx this date.  Patient has continued to report decreased pain in thumb and hand and decreased triggering however it is still present.  She would benefit from continued OT for a few sessions to completed last ionto txs, finalize HEP and make final recommendations.    Occupational performance deficits (Please refer to evaluation for details):  ADL's;IADL's    Rehab Potential  Fair    OT Frequency  1x / week    OT Duration  4 weeks    OT Treatment/Interventions  Self-care/ADL training;Therapeutic exercise;Patient/family education;Splinting;Iontophoresis;Ultrasound;Manual Therapy    Consulted and Agree with Plan of Care  Patient       Patient will benefit from skilled therapeutic intervention in order to improve the following deficits and impairments:  Impaired flexibility, Pain, Impaired UE functional use, Decreased strength, Decreased range of motion  Visit Diagnosis: Pain in joint of left hand - Plan: Ot plan of care cert/re-cert  Stiffness of left hand, not elsewhere classified - Plan: Ot plan of care cert/re-cert  Trigger finger of left thumb - Plan: Ot plan of care cert/re-cert    Problem List Patient Active Problem List   Diagnosis Date Noted  . 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 Urinary system disease 06/28/2013  . 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 FOM (frequency of micturition) 06/28/2013  . Cervical spinal cord compression ([place]) 11/28/2012  . Amnesia 11/09/2010   Mervil Wacker T Tomasita Morrow, OTR/L,  CLT  Emilliano Dilworth 05/11/2018, 9:21 AM  [facility] [address] [place], [address] Phone: [phone]   Fax:  [phone]  Name: Sandra Mcconnell MRN: [id number] Date of Birth: [birth year]

## 2018-05-23 ENCOUNTER — Ambulatory Visit: Payer: Medicare Other | Admitting: Occupational Therapy

## 2018-05-23 DIAGNOSIS — M25642 Stiffness of left hand, not elsewhere classified: Secondary | ICD-10-CM

## 2018-05-23 DIAGNOSIS — M25542 Pain in joints of left hand: Secondary | ICD-10-CM | POA: Diagnosis not present

## 2018-05-23 DIAGNOSIS — M65312 Trigger thumb, left thumb: Secondary | ICD-10-CM

## 2018-05-23 NOTE — Therapy (Signed)
[place] [facility] [address] [place], [address] Phone: [phone]   Fax:  [phone]  Occupational Therapy Treatment  Patient Details  Name: Sandra Mcconnell MRN: [id number] Date of Birth: 07-Dec-1940 Referring Provider: Jefm Bryant    Encounter Date: 05/23/2018  OT End of Session - 05/23/18 1635    Visit Number  9    Number of Visits  12    Date for OT Re-Evaluation  06/04/18    OT Start Time  1521    OT Stop Time  1620    OT Time Calculation (min)  59 min    Activity Tolerance  Patient tolerated treatment well    Behavior During Therapy  [facility] for tasks assessed/performed       Past Medical History:  Diagnosis Date  . Anemia   . Anemia 08/04/2017  . Anemia 08/04/2017  . Anxiety   . Carpal tunnel syndrome   . Carpal tunnel syndrome   . Cataract cortical, senile   . 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 Chicken pox   . Colitis   . Complication of anesthesia   . Depression   . Dizziness   . DJD (degenerative joint disease)   . Dysphagia   . GERD (gastroesophageal reflux disease)   . Hemorrhoids   . History of hiatal hernia   . History of palpitations    EMOTIONAL PALPITATIONS  . IBS (irritable bowel syndrome)   . Iritis   . Migraines   . Neuropathy   . OCD (obsessive compulsive disorder)   . Osteoarthritis   . Osteoporosis   . Panic attacks   . PONV (postoperative nausea and vomiting)   . Psoriasis   . Psoriasis   . Redundant colon   . REM sleep behavior disorder   . Sleep apnea   . Sleep disorder    REM SLEEP DISORDER  . Slow transit constipation   . Spondylosis    CERVICAL,SLEEPS WITH HOB ELEVATED AND WEARS NECK SUPPORT  . Spondylosis of cervical joint   . Spondylosis of cervical joint     Past Surgical History:  Procedure Laterality Date  . ABDOMINAL HYSTERECTOMY    . BACK SURGERY     CERVICAL FUSION 2003/DECOMPRESSION 2014 LUMBAR LAM 2009  . CATARACT EXTRACTION W/PHACO  Right 12/08/2015   Procedure: CATARACT EXTRACTION PHACO AND INTRAOCULAR LENS PLACEMENT (IOC);  Surgeon: Birder Robson, MD;  Location: [facility] ORS;  Service: Ophthalmology;  Laterality: Right;  [place] 01:09 AP% 22.6 CDE 15.81 fluid pack lot # [id number]  . COLONOSCOPY    . COLONOSCOPY WITH PROPOFOL N/A 11/15/2017   Procedure: COLONOSCOPY WITH PROPOFOL;  Surgeon: Manya Silvas, MD;  Location: [facility] ENDOSCOPY;  Service: Endoscopy;  Laterality: N/A;  . ESOPHAGOGASTRODUODENOSCOPY    . ESOPHAGOGASTRODUODENOSCOPY N/A 11/15/2017   Procedure: ESOPHAGOGASTRODUODENOSCOPY (EGD);  Surgeon: Manya Silvas, MD;  Location: [facility] ENDOSCOPY;  Service: Endoscopy;  Laterality: N/A;  . EYE SURGERY    . FOOT FUSION Bilateral   . HAND SURGERY    . SPINE SURGERY    . TONSILLECTOMY    . 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  Subjective Assessment - 05/23/18 1540    Subjective   The pain is better, my thumb use to hurt all the way down - still clicking little but do not lock on me - and still happens about 5 x day - but randomly -  really like the paraffin - I do have one at home -and that helps for the stiffness    Patient Stated Goals  If I can get the pain in my L thumb better and for it not to trigger     Currently in Pain?  No/denies       Tenderness less over A1pulley at thumb MP No triggering in session - PROM to base of 5th - but stiff and slow to more compare to R hand              OT Treatments/Exercises (OP) - 05/23/18 0001      Iontophoresis   Type of Iontophoresis  Dexamethasone    Location  L thumb MP A1pulley     Dose  small patch ,2.0 current     Time  19      LUE Contrast Bath   Time  11 minutes    Comments  prior to manual and ROM        PROM after contrast to base of 5th with no triggering and followed by AROM  Soft tissue mobs to MP A1pulley and webspace  And CT spreads and thumb into RA  To cont with at home       OT  Education - 05/23/18 1635    Education Details  home program-  progress     Person(s) Educated  Patient    Methods  Explanation;Demonstration    Comprehension  Verbalized understanding;Returned demonstration       OT Short Term Goals - 05/11/18 0821      OT SHORT TERM GOAL #1   Title  Pain and triggering in L thumb decrease to less than 2/1o pain and less than 2 triggers a day     Baseline  triggering several times during session , and pain at the best 4/10     Time  3    Period  Weeks    Status  On-going        OT Long Term Goals - 05/11/18 6213      OT LONG TERM GOAL #1   Title  Pt to be ind in HEP and using splint to decrease pain , triggering of L thumb     Baseline  no knowledge of HEP or splint     Time  3    Period  Weeks    Status  On-going      OT LONG TERM GOAL #2   Title  L thumb AROM improve to  5th opposition with no triggering and lat grip increase 2 lbs at least     Baseline  pain with last grip and increase pain - and opposition to 2nd fold of 5th with triggering     Time  4    Period  Weeks    Status  On-going            Plan - 05/23/18 1636    Clinical Impression Statement  Pt report that paraffin felt so good last time - and she retained decrease stiffness- she has unit she want to use at home - she report decrease pain in thumb and locking of thumb - still triggering but very small - and about 5  x day - but not related to act - pt had 6 session of ionto - will benefit from 4 more     Occupational performance deficits (Please refer to evaluation for details):  ADL's;IADL's    Rehab Potential  Fair  Current Impairments/barriers affecting progress:  comorbitities     OT Frequency  2x / week    OT Duration  4 weeks    OT Treatment/Interventions  Self-care/ADL training;Therapeutic exercise;Patient/family education;Splinting;Iontophoresis;Ultrasound;Manual Therapy    Plan  assess progress with homeprogram and 8 ionto     Clinical Decision Making   Several treatment options, min-mod task modification necessary    OT Home Exercise Plan  see pt instruction     Consulted and Agree with Plan of Care  Patient       Patient will benefit from skilled therapeutic intervention in order to improve the following deficits and impairments:  Impaired flexibility, Pain, Impaired UE functional use, Decreased strength, Decreased range of motion  Visit Diagnosis: Pain in joint of left hand  Stiffness of left hand, not elsewhere classified  Trigger finger of left thumb    Problem List Patient Active Problem List   Diagnosis Date Noted  . 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 Urinary system disease 06/28/2013  . 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 FOM (frequency of micturition) 06/28/2013  . Cervical spinal cord compression ([place]) 11/28/2012  . Amnesia 11/09/2010    Rosalyn Gess OTR/L,CLT 05/23/2018, 4:41 PM  [place] [facility] [address] [place], [address] Phone: [phone]   Fax:  [phone]  Name: Sandra Mcconnell MRN: [id number] Date of Birth: 1940/11/22

## 2018-05-23 NOTE — Patient Instructions (Signed)
Cont with contrast - or paraffin  Massage to thumb  And ice massage several times during day

## 2018-05-29 ENCOUNTER — Ambulatory Visit: Payer: Medicare Other | Admitting: Occupational Therapy

## 2018-05-29 DIAGNOSIS — M25542 Pain in joints of left hand: Secondary | ICD-10-CM | POA: Diagnosis not present

## 2018-05-29 DIAGNOSIS — M25642 Stiffness of left hand, not elsewhere classified: Secondary | ICD-10-CM

## 2018-05-29 DIAGNOSIS — M65312 Trigger thumb, left thumb: Secondary | ICD-10-CM

## 2018-05-29 NOTE — Patient Instructions (Signed)
Thumb PA and RA rubber band 10 reps   3 x day  Same HEP otherwise

## 2018-05-29 NOTE — Therapy (Signed)
[place] [facility] [address] [place], [address] Phone: [phone]   Fax:  [phone]  Occupational Therapy Treatment  Patient Details  Name: Sandra Mcconnell MRN: [id number] Date of Birth: Dec 09, 1940 Referring Provider: Jefm Bryant    Encounter Date: 05/29/2018  OT End of Session - 05/29/18 1259    Visit Number  10    Number of Visits  12    Date for OT Re-Evaluation  06/04/18    OT Start Time  1241    OT Stop Time  1331    OT Time Calculation (min)  50 min    Activity Tolerance  Patient tolerated treatment well    Behavior During Therapy  [facility] for tasks assessed/performed       Past Medical History:  Diagnosis Date  . Anemia   . Anemia 08/04/2017  . Anemia 08/04/2017  . Anxiety   . Carpal tunnel syndrome   . Carpal tunnel syndrome   . Cataract cortical, senile   . Cervical spinal cord compression ([place])   . Chicken pox   . Colitis   . Complication of anesthesia   . Depression   . Dizziness   . DJD (degenerative joint disease)   . Dysphagia   . GERD (gastroesophageal reflux disease)   . Hemorrhoids   . History of hiatal hernia   . History of palpitations    EMOTIONAL PALPITATIONS  . IBS (irritable bowel syndrome)   . Iritis   . Migraines   . Neuropathy   . OCD (obsessive compulsive disorder)   . Osteoarthritis   . Osteoporosis   . Panic attacks   . PONV (postoperative nausea and vomiting)   . Psoriasis   . Psoriasis   . Redundant colon   . REM sleep behavior disorder   . Sleep apnea   . Sleep disorder    REM SLEEP DISORDER  . Slow transit constipation   . Spondylosis    CERVICAL,SLEEPS WITH HOB ELEVATED AND WEARS NECK SUPPORT  . Spondylosis of cervical joint   . Spondylosis of cervical joint     Past Surgical History:  Procedure Laterality Date  . ABDOMINAL HYSTERECTOMY    . BACK SURGERY     CERVICAL FUSION 2003/DECOMPRESSION 2014 LUMBAR LAM 2009  . CATARACT EXTRACTION W/PHACO  Right 12/08/2015   Procedure: CATARACT EXTRACTION PHACO AND INTRAOCULAR LENS PLACEMENT (IOC);  Surgeon: Birder Robson, MD;  Location: [facility] ORS;  Service: Ophthalmology;  Laterality: Right;  [place] 01:09 AP% 22.6 CDE 15.81 fluid pack lot # [id number]  . COLONOSCOPY    . COLONOSCOPY WITH PROPOFOL N/A 11/15/2017   Procedure: COLONOSCOPY WITH PROPOFOL;  Surgeon: Manya Silvas, MD;  Location: [facility] ENDOSCOPY;  Service: Endoscopy;  Laterality: N/A;  . ESOPHAGOGASTRODUODENOSCOPY    . ESOPHAGOGASTRODUODENOSCOPY N/A 11/15/2017   Procedure: ESOPHAGOGASTRODUODENOSCOPY (EGD);  Surgeon: Manya Silvas, MD;  Location: [facility] ENDOSCOPY;  Service: Endoscopy;  Laterality: N/A;  . EYE SURGERY    . FOOT FUSION Bilateral   . HAND SURGERY    . SPINE SURGERY    . TONSILLECTOMY    . 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  Subjective Assessment - 05/29/18 1253    Subjective   My thumb is doing better  - not every day triggering anymore - not really pain - if not more than 3-4/10     Patient Stated Goals  If I can get the  pain in my L thumb better and for it not to trigger     Currently in Pain?  No/denies         Pt report less pain -and not every day triggering  AROM in PA and RA WNL - but weak  Add this date after soft tissue -    Thumb PA and RA rubber band 10 reps   3 x day  Same HEP otherwise        Skin check done prior to ionto - and afterwards pt to keep patch on for hour   OT Treatments/Exercises (OP) - 05/29/18 0001      Iontophoresis   Type of Iontophoresis  Dexamethasone    Location  L thumb MP A1pulley     Dose  small patch ,2.0 current     Time  19      LUE Contrast Bath   Time  11 minutes    Comments  prior to manual        PROM after contrast to base of 5th with no triggering and followed by AROM  Soft tissue mobs to MP A1pulley and webspace  And CT spreads and thumb into RA        OT Education - 05/29/18 1258     Education Details  Home program upgrade- add strengthening for PA and RA     Person(s) Educated  Patient    Methods  Explanation;Demonstration    Comprehension  Verbalized understanding;Returned demonstration       OT Short Term Goals - 05/11/18 0821      OT SHORT TERM GOAL #1   Title  Pain and triggering in L thumb decrease to less than 2/1o pain and less than 2 triggers a day     Baseline  triggering several times during session , and pain at the best 4/10     Time  3    Period  Weeks    Status  On-going        OT Long Term Goals - 05/11/18 2979      OT LONG TERM GOAL #1   Title  Pt to be ind in HEP and using splint to decrease pain , triggering of L thumb     Baseline  no knowledge of HEP or splint     Time  3    Period  Weeks    Status  On-going      OT LONG TERM GOAL #2   Title  L thumb AROM improve to  5th opposition with no triggering and lat grip increase 2 lbs at least     Baseline  pain with last grip and increase pain - and opposition to 2nd fold of 5th with triggering     Time  4    Period  Weeks    Status  On-going            Plan - 05/29/18 1301    Clinical Impression Statement  Pt show decrease pain and report decrease triggering and locking of thumb to not even every day anymore- but feels week in the thumb and hand  - initiated this date PA and RA strengthening of thumb     Occupational performance deficits (Please refer to evaluation for details):  ADL's;IADL's    Rehab Potential  Fair    Current Impairments/barriers affecting progress:  comorbitities     OT Frequency  1x / week    OT Duration  4 weeks  OT Treatment/Interventions  Self-care/ADL training;Therapeutic exercise;Patient/family education;Splinting;Iontophoresis;Ultrasound;Manual Therapy    Plan  assess progress with homeprogram and 9 ionto     Clinical Decision Making  Several treatment options, min-mod task modification necessary    OT Home Exercise Plan  see pt instruction      Consulted and Agree with Plan of Care  Patient       Patient will benefit from skilled therapeutic intervention in order to improve the following deficits and impairments:  Impaired flexibility, Pain, Impaired UE functional use, Decreased strength, Decreased range of motion  Visit Diagnosis: Pain in joint of left hand  Stiffness of left hand, not elsewhere classified  Trigger finger of left thumb    Problem List Patient Active Problem List   Diagnosis Date Noted  . 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 Urinary system disease 06/28/2013  . 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 FOM (frequency of micturition) 06/28/2013  . Cervical spinal cord compression ([place]) 11/28/2012  . Amnesia 11/09/2010    Rosalyn Gess OTR/l,CLT 05/29/2018, 3:24 PM  [place] [facility] [address], [place], [address] Phone: [phone]   Fax:  [phone]  Name: WANDALENE ABRAMS MRN: [id number] Date of Birth: 03/17/1941

## 2018-05-31 ENCOUNTER — Encounter: Payer: Medicare Other | Admitting: Occupational Therapy

## 2018-06-06 ENCOUNTER — Ambulatory Visit: Payer: Medicare Other | Admitting: Occupational Therapy

## 2018-06-06 DIAGNOSIS — M25542 Pain in joints of left hand: Secondary | ICD-10-CM | POA: Diagnosis not present

## 2018-06-06 DIAGNOSIS — M65312 Trigger thumb, left thumb: Secondary | ICD-10-CM

## 2018-06-06 DIAGNOSIS — M25642 Stiffness of left hand, not elsewhere classified: Secondary | ICD-10-CM

## 2018-06-06 NOTE — Therapy (Signed)
[place] [facility] [address], [place], [address] Phone: [phone]   Fax:  [phone]  Occupational Therapy Treatment  Patient Details  Name: Sandra Mcconnell MRN: [id number] Date of Birth: [birth year] Referring Provider: Jefm Bryant    Encounter Date: 06/06/2018  OT End of Session - 06/06/18 1412    Visit Number  11    Number of Visits  12    Date for OT Re-Evaluation  06/27/18    OT Start Time  1351    OT Stop Time  1440    OT Time Calculation (min)  49 min    Activity Tolerance  Patient tolerated treatment well    Behavior During Therapy  [facility] for tasks assessed/performed       Past Medical History:  Diagnosis Date  . Anemia   . Anemia 08/04/2017  . Anemia 08/04/2017  . Anxiety   . Carpal tunnel syndrome   . Carpal tunnel syndrome   . Cataract cortical, senile   . Cervical spinal cord compression ([place])   . Chicken pox   . Colitis   . Complication of anesthesia   . Depression   . Dizziness   . DJD (degenerative joint disease)   . Dysphagia   . GERD (gastroesophageal reflux disease)   . Hemorrhoids   . History of hiatal hernia   . History of palpitations    EMOTIONAL PALPITATIONS  . IBS (irritable bowel syndrome)   . Iritis   . Migraines   . Neuropathy   . OCD (obsessive compulsive disorder)   . Osteoarthritis   . Osteoporosis   . Panic attacks   . PONV (postoperative nausea and vomiting)   . Psoriasis   . Psoriasis   . Redundant colon   . REM sleep behavior disorder   . Sleep apnea   . Sleep disorder    REM SLEEP DISORDER  . Slow transit constipation   . Spondylosis    CERVICAL,SLEEPS WITH HOB ELEVATED AND WEARS NECK SUPPORT  . Spondylosis of cervical joint   . Spondylosis of cervical joint     Past Surgical History:  Procedure Laterality Date  . ABDOMINAL HYSTERECTOMY    . BACK SURGERY     CERVICAL FUSION 2003/DECOMPRESSION 2014 LUMBAR LAM 2009  . CATARACT EXTRACTION W/PHACO  Right 12/08/2015   Procedure: CATARACT EXTRACTION PHACO AND INTRAOCULAR LENS PLACEMENT (IOC);  Surgeon: Birder Robson, MD;  Location: [facility] ORS;  Service: Ophthalmology;  Laterality: Right;  [place] 01:09 AP% 22.6 CDE 15.81 fluid pack lot # [id number]  . COLONOSCOPY    . COLONOSCOPY WITH PROPOFOL N/A 11/15/2017   Procedure: COLONOSCOPY WITH PROPOFOL;  Surgeon: Manya Silvas, MD;  Location: [facility] ENDOSCOPY;  Service: Endoscopy;  Laterality: N/A;  . ESOPHAGOGASTRODUODENOSCOPY    . ESOPHAGOGASTRODUODENOSCOPY N/A 11/15/2017   Procedure: ESOPHAGOGASTRODUODENOSCOPY (EGD);  Surgeon: Manya Silvas, MD;  [facility] ENDOSCOPY;  Service: Endoscopy;  Laterality: N/A;  . EYE SURGERY    . FOOT FUSION Bilateral   . HAND SURGERY    . SPINE SURGERY    . TONSILLECTOMY    . 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  Subjective Assessment - 06/06/18 1353    Subjective   Did not notice any triggering and the pain I had in past is still better  - my psoriasis is acting up this week so I could not do my exercises like I should have  Patient Stated Goals  If I can get the pain in my L thumb better and for it not to trigger     Currently in Pain?  Yes    Pain Score  7     Pain Location  Hand    Pain Orientation  Right;Left    Pain Descriptors / Indicators  Aching;Tightness    Pain Type  Chronic pain         [facility] OT Assessment - 06/06/18 0001      Strength   Right Hand Grip (lbs)  25    Right Hand Lateral Pinch  10 lbs    Right Hand 3 Point Pinch  12 lbs    Left Hand Grip (lbs)  28    Left Hand Lateral Pinch  6 lbs    Left Hand 3 Point Pinch  8 lbs      Left Hand AROM   L Thumb MCP 0-60  60 Degrees    L Thumb IP 0-80  60 Degrees no triggering     L Thumb Radial ADduction/ABduction 0-55  54    L Thumb Palmar ADduction/ABduction 0-45  68       Pt report no pain like before - only her psoriasis acting up this week  -and did not had  triggering  AROM in  PA and RA WNL -and great  Progress in AROM  Cont with  soft tissue -   Thumb PA and RA rubber band 10 reps  To cont with   3 x day  Same HEP otherwise    Skin check done prior to ionto - and afterwards pt to keep patch on for hour          OT Treatments/Exercises (OP) - 06/06/18 0001      Iontophoresis   Type of Iontophoresis  Dexamethasone    Location  L thumb MP A1pulley     Dose  small patch ,2.0 current     Time  19      LUE Contrast Bath   Time  11 minutes    Comments  prior to manual therapy and ROM         PROM after contrast to base of 5th with no triggering and followed by AROM  Soft tissue mobs to MP A1pulley and webspace  And CT spreads and thumb into RA       OT Education - 06/06/18 1412    Education Details  progress and to cont with same HEP     Person(s) Educated  Patient    Methods  Explanation;Demonstration    Comprehension  Verbalized understanding;Returned demonstration       OT Short Term Goals - 06/06/18 1414      OT SHORT TERM GOAL #1   Title  Pain and triggering in L thumb decrease to less than 2/1o pain and less than 2 triggers a day     Status  Achieved        OT Long Term Goals - 06/06/18 1414      OT LONG TERM GOAL #1   Title  Pt to be ind in HEP and using splint to decrease pain , triggering of L thumb     Status  Achieved      OT LONG TERM GOAL #2   Title  L thumb AROM improve to  5th opposition with no triggering and lat grip increase 2 lbs at least     Status  Achieved  OT LONG TERM GOAL #3   Title  Pt to maintain not triggering and painfree ROM over 3 wks at home     Period  Weeks    Status  New    Target Date  06/27/18            Plan - 06/06/18 1413    Clinical Impression Statement  Pt cont to show big improvement in triggerin - pt report not having any triggerin again this past week and pain down into thumb that she had in past is also much better - had some issues this past week with psoriasis  flaring up on IP of thumb  and DIP of 2nd digits on L and R hand - pt to cont with HEP for 3 wks and return for reassessment     Occupational performance deficits (Please refer to evaluation for details):  ADL's;IADL's    Rehab Potential  Fair    Current Impairments/barriers affecting progress:  comorbitities     OT Frequency  Biweekly    OT Duration  4 weeks    OT Treatment/Interventions  Self-care/ADL training;Therapeutic exercise;Patient/family education;Splinting;Iontophoresis;Ultrasound;Manual Therapy    Plan  possible d/c if cont to have no issues     Clinical Decision Making  Several treatment options, min-mod task modification necessary    OT Home Exercise Plan  see pt instruction     Consulted and Agree with Plan of Care  Patient       Patient will benefit from skilled therapeutic intervention in order to improve the following deficits and impairments:  Impaired flexibility, Pain, Impaired UE functional use, Decreased strength, Decreased range of motion  Visit Diagnosis: Pain in joint of left hand  Stiffness of left hand, not elsewhere classified  Trigger finger of left thumb    Problem List Patient Active Problem List   Diagnosis Date Noted  . 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 Urinary system disease 06/28/2013  . 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 FOM (frequency of micturition) 06/28/2013  . Cervical spinal cord compression ([place]) 11/28/2012  . Amnesia 11/09/2010    Rosalyn Gess OTR/L,CLT 06/06/2018, 5:28 PM  [place] [facility] [address], [place], [address] Phone: [phone]   Fax:  [phone]  Name: Sandra Mcconnell MRN: [id number] Date of Birth: 03-08-1941

## 2018-06-06 NOTE — Patient Instructions (Signed)
Same HEP for 3 wks

## 2018-06-26 ENCOUNTER — Ambulatory Visit: Payer: Medicare Other | Attending: Rheumatology | Admitting: Occupational Therapy

## 2018-06-26 DIAGNOSIS — M25542 Pain in joints of left hand: Secondary | ICD-10-CM | POA: Diagnosis present

## 2018-06-26 DIAGNOSIS — M65312 Trigger thumb, left thumb: Secondary | ICD-10-CM | POA: Diagnosis present

## 2018-06-26 DIAGNOSIS — M25642 Stiffness of left hand, not elsewhere classified: Secondary | ICD-10-CM | POA: Diagnosis present

## 2018-06-26 NOTE — Patient Instructions (Signed)
Pt to cont with joint protection  And AROM for thumb PA , RA  And opposition  To maintain her ROM

## 2018-06-26 NOTE — Therapy (Signed)
[facility] [address] [place], [address] Phone: [phone]   Fax:  [phone]  Occupational Therapy Treatment/discharge  Patient Details  Name: Sandra Mcconnell MRN: [id number] Date of Birth: [birth year] Referring Provider: Jefm Bryant    Encounter Date: 06/26/2018  OT End of Session - 06/26/18 1155    Visit Number  12    Number of Visits  12    Date for OT Re-Evaluation  06/26/18    OT Start Time  1135    OT Stop Time  1150    OT Time Calculation (min)  15 min    Activity Tolerance  Patient tolerated treatment well    Behavior During Therapy  [facility] for tasks assessed/performed       Past Medical History:  Diagnosis Date  . Anemia   . Anemia 08/04/2017  . Anemia 08/04/2017  . Anxiety   . Carpal tunnel syndrome   . Carpal tunnel syndrome   . Cataract cortical, senile   . 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 Chicken pox   . Colitis   . Complication of anesthesia   . Depression   . Dizziness   . DJD (degenerative joint disease)   . Dysphagia   . GERD (gastroesophageal reflux disease)   . Hemorrhoids   . History of hiatal hernia   . History of palpitations    EMOTIONAL PALPITATIONS  . IBS (irritable bowel syndrome)   . Iritis   . Migraines   . Neuropathy   . OCD (obsessive compulsive disorder)   . Osteoarthritis   . Osteoporosis   . Panic attacks   . PONV (postoperative nausea and vomiting)   . Psoriasis   . Psoriasis   . Redundant colon   . REM sleep behavior disorder   . Sleep apnea   . Sleep disorder    REM SLEEP DISORDER  . Slow transit constipation   . Spondylosis    CERVICAL,SLEEPS WITH HOB ELEVATED AND WEARS NECK SUPPORT  . Spondylosis of cervical joint   . Spondylosis of cervical joint     Past Surgical History:  Procedure Laterality Date  . ABDOMINAL HYSTERECTOMY    . BACK SURGERY     CERVICAL FUSION 2003/DECOMPRESSION 2014 LUMBAR LAM 2009  . CATARACT EXTRACTION  W/PHACO Right 12/08/2015   Procedure: CATARACT EXTRACTION PHACO AND INTRAOCULAR LENS PLACEMENT (IOC);  Surgeon: Birder Robson, MD;  Location: [facility] ORS;  Service: Ophthalmology;  Laterality: Right;  [place] 01:09 AP% 22.6 CDE 15.81 fluid pack lot # [id number]  . COLONOSCOPY    . COLONOSCOPY WITH PROPOFOL N/A 11/15/2017   Procedure: COLONOSCOPY WITH PROPOFOL;  Surgeon: Manya Silvas, MD;  Location: [facility] ENDOSCOPY;  Service: Endoscopy;  Laterality: N/A;  . ESOPHAGOGASTRODUODENOSCOPY    . ESOPHAGOGASTRODUODENOSCOPY N/A 11/15/2017   Procedure: ESOPHAGOGASTRODUODENOSCOPY (EGD);  Surgeon: Manya Silvas, MD;  Location: [facility] ENDOSCOPY;  Service: Endoscopy;  Laterality: N/A;  . EYE SURGERY    . FOOT FUSION Bilateral   . HAND SURGERY    . SPINE SURGERY    . TONSILLECTOMY    . 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  Subjective Assessment - 06/26/18 1153    Subjective   My thumb did not trigger since I seen you 3 wks ago - only some soreness at times - but my thumb do not go into palm anymore -and my range of motion better and  with more ease     Patient Stated Goals  If I can get the pain in my L thumb better and for it not to trigger     Currently in Pain?  No/denies         [facility] OT Assessment - 06/26/18 0001      Strength   Right Hand Grip (lbs)  30    Right Hand Lateral Pinch  12 lbs    Right Hand 3 Point Pinch  12 lbs    Left Hand Grip (lbs)  35    Left Hand Lateral Pinch  7 lbs    Left Hand 3 Point Pinch  10 lbs      Left Hand AROM   L Thumb MCP 0-60  60 Degrees    L Thumb IP 0-80  64 Degrees    L Thumb Radial ADduction/ABduction 0-55  55    L Thumb Palmar ADduction/ABduction 0-45  70       Assess pt AROM and grip /prehension  Great progress   some pain but not more than 2-3/10 during day  Not triggering for the last 3-4 wks  Still tender over A1pulley at thumb - but much better since eval  Pt to cont with HEP  And joint protection                  OT Education - 06/26/18 1154    Education Details  discharge instruction     Person(s) Educated  Patient    Methods  Explanation;Demonstration    Comprehension  Verbalized understanding;Returned demonstration       OT Short Term Goals - 06/06/18 1414      OT SHORT TERM GOAL #1   Title  Pain and triggering in L thumb decrease to less than 2/1o pain and less than 2 triggers a day     Status  Achieved        OT Long Term Goals - 06/26/18 1157      OT LONG TERM GOAL #1   Title  Pt to be ind in HEP and using splint to decrease pain , triggering of L thumb     Status  Achieved      OT LONG TERM GOAL #2   Title  L thumb AROM improve to  5th opposition with no triggering and lat grip increase 2 lbs at least     Status  Achieved      OT LONG TERM GOAL #3   Title  Pt to maintain not triggering and painfree ROM over 3 wks at home     Status  Achieved            Plan - 06/26/18 1155    Clinical Impression Statement  Pt made great progress from [facility] with L thumb trigger - decrease pain , no triggering for the last 3-4 wks- AROM in thumb WFL now -  and grip and prehension improve greatly -  able to use hand in ADL's and IADL's - discharge at this time     Occupational performance deficits (Please refer to evaluation for details):  ADL's;IADL's    Plan  d/c with HEP for ROM and joint protection     OT Home Exercise Plan  see pt instruction     Consulted and Agree with Plan of Care  Patient       Patient will benefit from skilled therapeutic intervention in order to improve the following deficits and impairments:  Visit Diagnosis: Pain in joint of left hand  Stiffness of left hand, not elsewhere classified  Trigger finger of left thumb    Problem List Patient Active Problem List   Diagnosis Date Noted  . 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 Urinary system disease 06/28/2013  .  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 FOM (frequency of micturition) 06/28/2013  . Cervical spinal cord compression ([place]) 11/28/2012  . Amnesia 11/09/2010    Rosalyn Gess OTR/l,CLT  06/26/2018, 12:34 PM  [facility] [address] [place], [address] Phone: [phone]   Fax:  [phone]  Name: Sandra Mcconnell MRN: [id number] Date of Birth: May 25, 1941

## 2019-03-21 ENCOUNTER — Other Ambulatory Visit: Payer: Self-pay | Admitting: Family Medicine

## 2019-10-14 ENCOUNTER — Emergency Department
Admission: EM | Admit: 2019-10-14 | Discharge: 2019-10-14 | Disposition: A | Discharge status: Home / Self Care | Payer: Medicare Other | Attending: Student in an Organized Health Care Education/Training Program | Admitting: Student in an Organized Health Care Education/Training Program

## 2019-10-14 ENCOUNTER — Encounter: Payer: Self-pay | Admitting: Emergency Medicine

## 2019-10-14 DIAGNOSIS — X100XXA Contact with hot drinks, initial encounter: Secondary | ICD-10-CM | POA: Diagnosis not present

## 2019-10-14 DIAGNOSIS — Y92018 Other place in single-family (private) house as the place of occurrence of the external cause: Secondary | ICD-10-CM | POA: Diagnosis not present

## 2019-10-14 DIAGNOSIS — T24012A Burn of unspecified degree of left thigh, initial encounter: Secondary | ICD-10-CM | POA: Diagnosis not present

## 2019-10-14 DIAGNOSIS — T300 Burn of unspecified body region, unspecified degree: Secondary | ICD-10-CM

## 2019-10-14 DIAGNOSIS — Y998 Other external cause status: Secondary | ICD-10-CM | POA: Diagnosis not present

## 2019-10-14 DIAGNOSIS — Y93G1 Activity, food preparation and clean up: Secondary | ICD-10-CM | POA: Insufficient documentation

## 2019-10-14 DIAGNOSIS — T23112A Burn of first degree of left thumb (nail), initial encounter: Secondary | ICD-10-CM | POA: Insufficient documentation

## 2019-10-14 DIAGNOSIS — Z7982 Long term (current) use of aspirin: Secondary | ICD-10-CM | POA: Insufficient documentation

## 2019-10-14 DIAGNOSIS — Z79899 Other long term (current) drug therapy: Secondary | ICD-10-CM | POA: Diagnosis not present

## 2019-10-14 DIAGNOSIS — T23012A Burn of unspecified degree of left thumb (nail), initial encounter: Secondary | ICD-10-CM | POA: Diagnosis present

## 2019-10-14 MED ORDER — LIDOCAINE HCL URETHRAL/MUCOSAL 2 % EX GEL
1.0000 "application " | Freq: Once | CUTANEOUS | Status: AC
  Administered 2019-10-14: 1 via TOPICAL
  Filled 2019-10-14: qty 5
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
## 2019-10-14 NOTE — Discharge Instructions (Addendum)
You should keep the blister on the thumb intact for as long as possible. Use mild soap & water to cleanse daily. Use topical burn cream as needed for burn pain. Follow-up with your provider as needed.
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
## 2020-06-05 ENCOUNTER — Other Ambulatory Visit: Payer: Self-pay | Admitting: Family Medicine

## 2020-06-05 DIAGNOSIS — Z1231 Encounter for screening mammogram for malignant neoplasm of breast: Secondary | ICD-10-CM

## 2020-08-03 ENCOUNTER — Other Ambulatory Visit: Payer: Self-pay

## 2020-08-03 ENCOUNTER — Ambulatory Visit
Admission: RE | Admit: 2020-08-03 | Discharge: 2020-08-03 | Disposition: A | Discharge status: Home / Self Care | Payer: Medicare PPO | Source: Ambulatory Visit | Attending: Family Medicine | Admitting: Family Medicine

## 2020-08-03 DIAGNOSIS — Z1231 Encounter for screening mammogram for malignant neoplasm of breast: Secondary | ICD-10-CM | POA: Diagnosis not present

## 2020-08-10 ENCOUNTER — Other Ambulatory Visit: Payer: Self-pay

## 2020-08-12 ENCOUNTER — Ambulatory Visit: Payer: Medicare PPO | Admitting: Gastroenterology

## 2020-08-12 ENCOUNTER — Encounter: Payer: Self-pay | Admitting: Gastroenterology

## 2020-08-12 ENCOUNTER — Other Ambulatory Visit: Payer: Self-pay

## 2020-08-12 VITALS — BP 118/57 | HR 70 | Temp 98.1°F | Ht <= 58 in | Wt 132.4 lb

## 2020-08-12 DIAGNOSIS — K219 Gastro-esophageal reflux disease without esophagitis: Secondary | ICD-10-CM | POA: Diagnosis not present

## 2020-08-12 DIAGNOSIS — K623 Rectal prolapse: Secondary | ICD-10-CM

## 2020-08-12 DIAGNOSIS — D509 Iron deficiency anemia, unspecified: Secondary | ICD-10-CM

## 2020-08-12 NOTE — Patient Instructions (Signed)

## 2020-08-12 NOTE — Progress Notes (Signed)
Cephas Darby, MD [address]  [place]  [place], [place] [address]  Main: [phone]  Fax: [phone]    Gastroenterology Consultation  Referring Provider:     Juluis Pitch, MD Primary Care Physician:  Juluis Pitch, MD Primary Gastroenterologist:  Dr. Cephas Darby Reason for Consultation:     GERD, constipation, rectal bleeding        HPI:   Sandra Mcconnell is a 79 y.o. female referred by Dr. Juluis Pitch, MD  for consultation & management of constipation and rectal bleeding, chronic GERD  Constipation and rectal bleeding: Patient reports that she has history of rectocele, was followed by Dr. Vira Agar.  She reports that on few occasions in the past, she had protrusion of the entire rectum, more than size of a golf ball and she manually reduced it with some discomfort.  For the last few weeks, her main concern is mucus discharge with occasional blood.  She reports that her bowel movements are soft but associated with incomplete emptying and bloating.  She also had fecal incontinence 3 times in the past.  She underwent colonoscopy in 11/2017 by Dr. Vira Agar, found to have hemorrhoids only.  Patient reports that she has self learnt to minimize rectal prolapse during defecation by following exercises, use a squatty potty and applies pressure during bowel movement.  She tried Metamucil which resulted in bloating.  She reports chronic abdominal bloating as well.  She takes senna daily which does not seem to help.  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  She also underwent EGD in 11/2017 by Dr. Vira Agar for difficulty swallowing, esophagus was empirically dilated with savory dilators.  There were no structural lesions in the esophagus  Patient is also found to have chronic iron deficiency anemia of unclear etiology.  She is not on any oral iron supplements.  She tried  over-the-counter oral iron which resulted in stomach upset.  NSAIDs: None  Antiplts/Anticoagulants/Anti thrombotics: None  GI Procedures:  EGD and colonoscopy 11/15/2017 - Normal esophagus. Dilated. - Erythematous mucosa in the gastric body. - Normal examined duodenum. - No specimens collected.  - Internal hemorrhoids. - The examination was otherwise normal. - No specimens collected.  Past Medical History:  Diagnosis Date  . Anemia   . Anemia 08/04/2017  . Anemia 08/04/2017  . Anxiety   . Carpal tunnel syndrome   . Carpal tunnel syndrome   . Cataract cortical, senile   . 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 Chicken pox   . Colitis   . Complication of anesthesia   . Depression   . Dizziness   . DJD (degenerative joint disease)   . Dysphagia   . GERD (gastroesophageal reflux disease)   . Hemorrhoids   . History of hiatal hernia   . History of palpitations    EMOTIONAL PALPITATIONS  . IBS (irritable bowel syndrome)   . Iritis   . Migraines   . Neuropathy   . OCD (obsessive compulsive disorder)   . Osteoarthritis   . Osteoporosis   . Panic attacks   . PONV (postoperative nausea and vomiting)   . Psoriasis   . Psoriasis   . Redundant colon   . REM sleep behavior disorder   . Sleep apnea   . Sleep disorder    REM SLEEP DISORDER  . Slow transit constipation   . Spondylosis    CERVICAL,SLEEPS WITH  HOB ELEVATED AND WEARS NECK SUPPORT  . Spondylosis of cervical joint   . Spondylosis of cervical joint     Past Surgical History:  Procedure Laterality Date  . ABDOMINAL HYSTERECTOMY    . BACK SURGERY     CERVICAL FUSION 2003/DECOMPRESSION 2014 LUMBAR LAM 2009  . CATARACT EXTRACTION W/PHACO Right 12/08/2015   Procedure: CATARACT EXTRACTION PHACO AND INTRAOCULAR LENS PLACEMENT (IOC);  Surgeon: Birder Robson, MD;  Location: [facility] ORS;  Service: Ophthalmology;  Laterality: Right;  [place] 01:09 AP% 22.6 CDE 15.81 fluid pack lot # [id number]  . COLONOSCOPY    .  COLONOSCOPY WITH PROPOFOL N/A 11/15/2017   Procedure: COLONOSCOPY WITH PROPOFOL;  Surgeon: Manya Silvas, MD;  Location: [facility] ENDOSCOPY;  Service: Endoscopy;  Laterality: N/A;  . ESOPHAGOGASTRODUODENOSCOPY    . ESOPHAGOGASTRODUODENOSCOPY N/A 11/15/2017   Procedure: ESOPHAGOGASTRODUODENOSCOPY (EGD);  Surgeon: Manya Silvas, MD;  Location: [facility] ENDOSCOPY;  Service: Endoscopy;  Laterality: N/A;  . EYE SURGERY    . FOOT FUSION Bilateral   . HAND SURGERY    . SPINE SURGERY    . TONSILLECTOMY    . 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  acetaminophen (TYLENOL) 325 MG tablet, Take 650 mg by mouth 3 (three) times daily., Disp: , Rfl:  .  betamethasone dipropionate (DIPROLENE) 0.05 % ointment, , Disp: , Rfl: 6 .  Calcium Carbonate-Vitamin D 600-400 MG-UNIT tablet, Take 1 tablet by mouth 2 (two) times daily., Disp: , Rfl:  .  diclofenac sodium (VOLTAREN) 1 % GEL, Apply topically. AS NEEDED, Disp: , Rfl:  .  Glucosamine Sulfate 500 MG CAPS, Take 500 mg by mouth 3 (three) times daily., Disp: , Rfl:  .  ketorolac (TORADOL) 10 MG tablet, Take 10 mg by mouth. NO MORE THAN 5 PER MONTH, Disp: , Rfl:  .  omeprazole (PRILOSEC) 20 MG capsule, Take 20 mg by mouth daily. , Disp: , Rfl: 2 .  ondansetron (ZOFRAN) 4 MG tablet, , Disp: , Rfl:  .  prazosin (MINIPRESS) 2 MG capsule, Take by mouth., Disp: , Rfl:  .  QUEtiapine (SEROQUEL) 25 MG tablet, Take by mouth., Disp: , Rfl:  .  raloxifene (EVISTA) 60 MG tablet, Take 60 mg by mouth daily., Disp: , Rfl: 1 .  rOPINIRole (REQUIP) 0.5 MG tablet, Take 1 mg by mouth at bedtime., Disp: , Rfl: 5 .  sertraline (ZOLOFT) 100 MG tablet, Take 200 mg by mouth daily. , Disp: , Rfl: 2 .  Simethicone 180 MG CAPS, Take 180 mg by mouth 2 (two) times daily., Disp: , Rfl:  .  tiZANidine (ZANAFLEX) 4 MG tablet, 2 mg every 6 (six) hours as needed. , Disp: , Rfl:  .  Aspirin-Calcium Carbonate 81-777 MG TABS, Take 1 tablet by mouth daily., Disp: ,  Rfl:  .  dicyclomine (BENTYL) 10 MG capsule, Take by mouth., Disp: , Rfl:  .  docusate sodium (COLACE) 50 MG capsule, Take by mouth., Disp: , Rfl:  .  Ginger 500 MG CAPS, Take by mouth., Disp: , Rfl:  .  midazolam (VERSED) 5 MG/5ML SOLN injection, Inject into the vein., Disp: , Rfl:  .  Multiple Vitamins tablet, Take by mouth., Disp: , Rfl:  .  ondansetron (ZOFRAN-ODT) 4 MG disintegrating tablet, Take by mouth., Disp: , Rfl:  .  Probiotic CAPS, Take by mouth., Disp: , Rfl:  .  senna (SENOKOT) 8.6 MG tablet, Take by mouth., Disp: , Rfl:  .  silver  sulfADIAZINE (SILVADENE) 1 % cream, Apply topically., Disp: , Rfl:    Family History  Problem Relation Age of Onset  . Breast cancer Mother        71's     Social History   Tobacco Use  . Smoking status: Never Smoker  . Smokeless tobacco: Never Used  Vaping Use  . Vaping Use: Never used  Substance Use Topics  . Alcohol use: No    Alcohol/week: 0.0 standard drinks  . Drug use: No    Allergies as of 08/12/2020 - Review Complete 08/12/2020  Allergen Reaction Noted  . Aripiprazole Nausea And Vomiting 10/29/2014  . Ciprofloxacin Swelling 10/29/2014  . Mirtazapine Nausea And Vomiting 10/29/2014  . Penicillins Swelling 10/29/2014    Review of Systems:    All systems reviewed and negative except where noted in HPI.   Physical Exam:  BP (!) 118/57 (BP Location: Left Arm, Patient Position: Sitting, Cuff Size: Normal)   Pulse 70   Temp 98.1 F (36.7 C) (Oral)   Ht 4\' 10"  (1.473 m)   Wt 132 lb 6 oz (60 kg)   BMI 27.67 kg/m  No LMP recorded. Patient has had a hysterectomy.  General:   Alert,  Well-developed, well-nourished, pleasant and cooperative in NAD Head:  Normocephalic and atraumatic. Eyes:  Sclera clear, no icterus.   Conjunctiva pink. Ears:  Normal auditory acuity. Nose:  No deformity, discharge, or lesions. Mouth:  No deformity or lesions,oropharynx pink & moist. Neck:  Supple; no masses or thyromegaly. Lungs:   Respirations even and unlabored.  Clear throughout to auscultation.   No wheezes, crackles, or rhonchi. No acute distress. Heart:  Regular rate and rhythm; no murmurs, clicks, rubs, or gallops. Abdomen:  Normal bowel sounds. Soft, non-tender and moderately distended, tympanic to percussion, without masses, hepatosplenomegaly or hernias noted.  No guarding or rebound tenderness.   Rectal: Normal perianal exam, evidence of partial rectal prolapse to the pelvic floor but not outside the anal canal when patient was allowed to bear down Msk:  Symmetrical without gross deformities. Good, equal movement & strength bilaterally. Pulses:  Normal pulses noted. Extremities:  No clubbing or edema.  No cyanosis. Neurologic:  Alert and oriented x3;  grossly normal neurologically. Skin:  Intact without significant lesions or rashes. No jaundice. Psych:  Alert and cooperative. Normal mood and affect.  Imaging Studies: Reviewed  Assessment and Plan:   MOHOGANY TOPPINS is a 79 y.o. pleasant Caucasian female with cervical spine issues is seen in consultation for colon cancer screening and chronic GERD  Patient already underwent colonoscopy with adequate prep in 2019 by Dr. Vira Agar.  No evidence of polyps reported, will defer colonoscopy at this time  Rectal prolapse: Resulting in episodes of fecal incontinence, mucus discharge, abdominal bloating and incomplete emptying Discussed about various techniques to minimize the size of the rectal prolapse Start MiraLAX at least once a day Continue to use squatty potty I have also discussed about referral for surgical repair of rectal prolapse, patient deferred at this time  Chronic GERD: EGD in 2019 unremarkable Currently asymptomatic Continue Omeprazole 20 mg daily  History of chronic iron deficiency anemia Recheck CBC, iron panel, B12 and folate panel Trial of fusion plus every other day, samples provided   Follow up in 4 weeks   Cephas Darby, MD

## 2020-08-13 LAB — IRON,TIBC AND FERRITIN PANEL
Ferritin: 317 ng/mL — ABNORMAL HIGH (ref 15–150)
Iron Saturation: 33 % (ref 15–55)
Iron: 88 ug/dL (ref 27–139)
Total Iron Binding Capacity: 264 ug/dL (ref 250–450)
UIBC: 176 ug/dL (ref 118–369)

## 2020-08-13 LAB — B12 AND FOLATE PANEL
Folate: >20 ng/mL (ref >3.0)
Vitamin B-12: 1333 pg/mL — ABNORMAL HIGH (ref 232–1245)

## 2020-08-13 LAB — CBC
Hematocrit: 31.3 % — ABNORMAL LOW (ref 34.0–46.6)
Hemoglobin: 10.7 g/dL — ABNORMAL LOW (ref 11.1–15.9)
MCH: 32.5 pg (ref 26.6–33.0)
MCHC: 34.2 g/dL (ref 31.5–35.7)
MCV: 95 fL (ref 79–97)
Platelets: 186 10*3/uL (ref 150–450)
RBC: 3.29 x10E6/uL — ABNORMAL LOW (ref 3.77–5.28)
RDW: 12.4 % (ref 11.7–15.4)
WBC: 5.9 10*3/uL (ref 3.4–10.8)

## 2020-08-27 ENCOUNTER — Other Ambulatory Visit: Payer: Self-pay | Admitting: Ophthalmology

## 2020-08-27 DIAGNOSIS — G453 Amaurosis fugax: Secondary | ICD-10-CM

## 2020-09-04 ENCOUNTER — Ambulatory Visit
Admission: RE | Admit: 2020-09-04 | Discharge: 2020-09-04 | Disposition: A | Discharge status: Home / Self Care | Payer: Medicare PPO | Source: Ambulatory Visit | Attending: Ophthalmology | Admitting: Ophthalmology

## 2020-09-04 ENCOUNTER — Other Ambulatory Visit: Payer: Self-pay

## 2020-09-04 DIAGNOSIS — G453 Amaurosis fugax: Secondary | ICD-10-CM | POA: Diagnosis not present

## 2020-09-14 ENCOUNTER — Other Ambulatory Visit: Payer: Self-pay

## 2020-09-15 ENCOUNTER — Ambulatory Visit: Payer: Medicare PPO | Admitting: Gastroenterology

## 2020-09-15 ENCOUNTER — Other Ambulatory Visit: Payer: Self-pay

## 2020-09-15 ENCOUNTER — Encounter: Payer: Self-pay | Admitting: Gastroenterology

## 2020-09-15 VITALS — BP 114/58 | HR 69 | Temp 98.7°F | Ht <= 58 in | Wt 132.4 lb

## 2020-09-15 DIAGNOSIS — R140 Abdominal distension (gaseous): Secondary | ICD-10-CM | POA: Diagnosis not present

## 2020-09-15 DIAGNOSIS — K219 Gastro-esophageal reflux disease without esophagitis: Secondary | ICD-10-CM

## 2020-09-15 DIAGNOSIS — D649 Anemia, unspecified: Secondary | ICD-10-CM | POA: Diagnosis not present

## 2020-09-15 NOTE — Progress Notes (Signed)
Cephas Darby, MD [address]  [place]  [place], [place] [address]  Main: [phone]  Fax: [phone]    Gastroenterology Consultation  Referring Provider:     Juluis Pitch, MD Primary Care Physician:  Juluis Pitch, MD Primary Gastroenterologist:  Dr. Cephas Darby Reason for Consultation:     GERD, constipation, rectal bleeding        HPI:   Sandra Mcconnell is a 79 y.o. female referred by Dr. Juluis Pitch, MD  for consultation & management of constipation and rectal bleeding, chronic GERD  Constipation and rectal bleeding: Patient reports that she has history of rectocele, was followed by Dr. Vira Agar.  She reports that on few occasions in the past, she had protrusion of the entire rectum, more than size of a golf ball and she manually reduced it with some discomfort.  For the last few weeks, her main concern is mucus discharge with occasional blood.  She reports that her bowel movements are soft but associated with incomplete emptying and bloating.  She also had fecal incontinence 3 times in the past.  She underwent colonoscopy in 11/2017 by Dr. Vira Agar, found to have hemorrhoids only.  Patient reports that she has self learnt to minimize rectal prolapse during defecation by following exercises, use a squatty potty and applies pressure during bowel movement.  She tried Metamucil which resulted in bloating.  She reports chronic abdominal bloating as well.  She takes senna daily which does not seem to help.  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  She also underwent EGD in 11/2017 by Dr. Vira Agar for difficulty swallowing, esophagus was empirically dilated with savory dilators.  There were no structural lesions in the esophagus  Patient is also found to have chronic iron deficiency anemia of unclear etiology.  She is not on any oral iron supplements.  She tried  over-the-counter oral iron which resulted in stomach upset.  Follow-up visit 09/15/2020 Patient reports that her reflux is fairly under control on omeprazole 20 mg daily.  She does have several years history of choking episodes, likely secondary to cervical spine surgery and she was trained how to swallow several years ago.  She has difficulty with liquids usually.  She had history of a stroke 3 weeks ago, currently undergoing work-up.  Her constipation is significantly improved, incorporating more dietary fiber, cut back on senna.  Reports having bowel movements daily, on [place] stool scale usually 3 or 4.  Sometimes 2.  She does not take any stool softeners at this time. She is concerned about abdominal bloating.  Patient is accompanied by her husband today.  She denies any weight loss, loss of appetite.  She is also not taking any supplements.  She is persistently anemic however does not have iron, B12 or folate deficiency  NSAIDs: None  Antiplts/Anticoagulants/Anti thrombotics: None  GI Procedures:  EGD and colonoscopy 11/15/2017 - Normal esophagus. Dilated. - Erythematous mucosa in the gastric body. - Normal examined duodenum. - No specimens collected.  - Internal hemorrhoids. - The examination was otherwise normal. - No specimens collected.  Past Medical History:  Diagnosis Date  . Anemia   . Anemia 08/04/2017  . Anemia 08/04/2017  . Anxiety   . Carpal tunnel syndrome   . Carpal tunnel syndrome   . Cataract cortical, senile   . 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 Chicken pox   .  Colitis   . Complication of anesthesia   . Depression   . Dizziness   . DJD (degenerative joint disease)   . Dysphagia   . GERD (gastroesophageal reflux disease)   . Hemorrhoids   . History of hiatal hernia   . History of palpitations    EMOTIONAL PALPITATIONS  . IBS (irritable bowel syndrome)   . Iritis   . Migraines   . Neuropathy   . OCD (obsessive compulsive disorder)   .  Osteoarthritis   . Osteoporosis   . Panic attacks   . PONV (postoperative nausea and vomiting)   . Psoriasis   . Psoriasis   . Redundant colon   . REM sleep behavior disorder   . Sleep apnea   . Sleep disorder    REM SLEEP DISORDER  . Slow transit constipation   . Spondylosis    CERVICAL,SLEEPS WITH HOB ELEVATED AND WEARS NECK SUPPORT  . Spondylosis of cervical joint   . Spondylosis of cervical joint     Past Surgical History:  Procedure Laterality Date  . ABDOMINAL HYSTERECTOMY    . BACK SURGERY     CERVICAL FUSION 2003/DECOMPRESSION 2014 LUMBAR LAM 2009  . CATARACT EXTRACTION W/PHACO Right 12/08/2015   Procedure: CATARACT EXTRACTION PHACO AND INTRAOCULAR LENS PLACEMENT (IOC);  Surgeon: Birder Robson, MD;  Location: [facility] ORS;  Service: Ophthalmology;  Laterality: Right;  [place] 01:09 AP% 22.6 CDE 15.81 fluid pack lot # [id number]  . COLONOSCOPY    . COLONOSCOPY WITH PROPOFOL N/A 11/15/2017   Procedure: COLONOSCOPY WITH PROPOFOL;  Surgeon: Manya Silvas, MD;  Location[facility] ENDOSCOPY;  Service: Endoscopy;  Laterality: N/A;  . ESOPHAGOGASTRODUODENOSCOPY    . ESOPHAGOGASTRODUODENOSCOPY N/A 11/15/2017   Procedure: ESOPHAGOGASTRODUODENOSCOPY (EGD);  Surgeon: Manya Silvas, MD;  Location: [facility] ENDOSCOPY;  Service: Endoscopy;  Laterality: N/A;  . EYE SURGERY    . FOOT FUSION Bilateral   . HAND SURGERY    . SPINE SURGERY    . TONSILLECTOMY    . 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  acetaminophen (TYLENOL) 325 MG tablet, Take 650 mg by mouth 3 (three) times daily., Disp: , Rfl:  .  Aspirin-Calcium Carbonate 81-777 MG TABS, Take 1 tablet by mouth daily., Disp: , Rfl:  .  betamethasone dipropionate (DIPROLENE) 0.05 % ointment, , Disp: , Rfl: 6 .  buprenorphine (BUTRANS) 7.5 MCG/HR, , Disp: , Rfl:  .  Calcium Carbonate-Vitamin D 600-400 MG-UNIT tablet, Take 1 tablet by mouth 2 (two) times daily., Disp: , Rfl:  .  diclofenac sodium (VOLTAREN) 1 %  GEL, Apply topically. AS NEEDED, Disp: , Rfl:  .  gabapentin (NEURONTIN) 800 MG tablet, , Disp: , Rfl:  .  Ginger 500 MG CAPS, Take by mouth., Disp: , Rfl:  .  Glucosamine Sulfate 500 MG CAPS, Take 500 mg by mouth 3 (three) times daily., Disp: , Rfl:  .  ketorolac (TORADOL) 10 MG tablet, Take 10 mg by mouth. NO MORE THAN 5 PER MONTH, Disp: , Rfl:  .  Multiple Vitamins tablet, Take by mouth., Disp: , Rfl:  .  omeprazole (PRILOSEC) 20 MG capsule, Take 20 mg by mouth daily. , Disp: , Rfl: 2 .  ondansetron (ZOFRAN-ODT) 4 MG disintegrating tablet, Take by mouth., Disp: , Rfl:  .  prazosin (MINIPRESS) 2 MG capsule, Take by mouth., Disp: , Rfl:  .  Probiotic CAPS, Take by mouth., Disp: , Rfl:  .  QUEtiapine (SEROQUEL) 25 MG  tablet, Take by mouth., Disp: , Rfl:  .  raloxifene (EVISTA) 60 MG tablet, Take 60 mg by mouth daily., Disp: , Rfl: 1 .  rOPINIRole (REQUIP) 0.5 MG tablet, Take 1 mg by mouth at bedtime., Disp: , Rfl: 5 .  sertraline (ZOLOFT) 100 MG tablet, Take 200 mg by mouth daily. , Disp: , Rfl: 2 .  Simethicone 180 MG CAPS, Take 180 mg by mouth 2 (two) times daily., Disp: , Rfl:  .  tiZANidine (ZANAFLEX) 4 MG tablet, 2 mg every 6 (six) hours as needed. , Disp: , Rfl:    Family History  Problem Relation Age of Onset  . Breast cancer Mother        73's     Social History   Tobacco Use  . Smoking status: Never Smoker  . Smokeless tobacco: Never Used  Vaping Use  . Vaping Use: Never used  Substance Use Topics  . Alcohol use: No    Alcohol/week: 0.0 standard drinks  . Drug use: No    Allergies as of 09/15/2020 - Review Complete 09/15/2020  Allergen Reaction Noted  . Aripiprazole Nausea And Vomiting 10/29/2014  . Ciprofloxacin Swelling 10/29/2014  . Mirtazapine Nausea And Vomiting 10/29/2014  . Penicillins Swelling 10/29/2014    Review of Systems:    All systems reviewed and negative except where noted in HPI.   Physical Exam:  BP (!) 114/58 (BP Location: Left Arm, Patient  Position: Sitting, Cuff Size: Normal)   Pulse 69   Temp 98.7 F (37.1 C) (Oral)   Ht 4\' 10"  (1.473 m)   Wt 132 lb 6 oz (60 kg)   BMI 27.67 kg/m  No LMP recorded. Patient has had a hysterectomy.  General:   Alert,  Well-developed, well-nourished, pleasant and cooperative in NAD Head:  Normocephalic and atraumatic. Eyes:  Sclera clear, no icterus.   Conjunctiva pink. Ears:  Normal auditory acuity. Nose:  No deformity, discharge, or lesions. Mouth:  No deformity or lesions,oropharynx pink & moist. Neck:  Supple; cervical neck collar in place no masses or thyromegaly. Lungs:  Respirations even and unlabored.  Clear throughout to auscultation.   No wheezes, crackles, or rhonchi. No acute distress. Heart:  Regular rate and rhythm; no murmurs, clicks, rubs, or gallops. Abdomen:  Normal bowel sounds. Soft, non-tender and moderately distended, tympanic to percussion, without masses, hepatosplenomegaly or hernias noted.  No guarding or rebound tenderness.   Rectal: Not performed Msk:  Symmetrical without gross deformities. Good, equal movement & strength bilaterally. Pulses:  Normal pulses noted. Extremities:  No clubbing or edema.  No cyanosis. Neurologic:  Alert and oriented x3;  grossly normal neurologically. Skin:  Intact without significant lesions or rashes. No jaundice. Psych:  Alert and cooperative. Normal mood and affect.  Imaging Studies: Reviewed  Assessment and Plan:   Sandra Mcconnell is a 79 y.o. pleasant Caucasian female with cervical spine issues is seen in consultation for chronic GERD.  Patient has history of partial rectal prolapse, patient deferred surgical repair at this time  Patient already underwent colonoscopy with adequate prep in 2019 by Dr. Vira Agar.  No evidence of polyps reported, will defer colonoscopy at this time  Chronic GERD: EGD in 2019 unremarkable Currently asymptomatic Continue Omeprazole 20 mg daily, increase to twice daily as needed  History of  chronic iron deficiency anemia: Bidirectional endoscopy was unremarkable Most recent iron studies does not reveal iron deficiency.  B12 levels are elevated, folate normal Discussed with patient regarding referral to hematology, she  deferred at this time.  Advised her to discuss with her PCP if needed  Abdominal bloating Most likely secondary to sedentary lifestyle Trial of FD guard, samples provided   Follow up as needed   Cephas Darby, MD

## 2020-11-03 ENCOUNTER — Emergency Department: Payer: Medicare PPO

## 2020-11-03 ENCOUNTER — Inpatient Hospital Stay
Admission: EM | Admit: 2020-11-03 | Discharge: 2020-11-12 | DRG: 481 | Disposition: A | Discharge status: Nursing Home (Includes ICF) | Payer: Medicare PPO | Source: Skilled Nursing Facility | Attending: Internal Medicine | Admitting: Internal Medicine

## 2020-11-03 ENCOUNTER — Other Ambulatory Visit: Payer: Self-pay

## 2020-11-03 DIAGNOSIS — I959 Hypotension, unspecified: Secondary | ICD-10-CM | POA: Diagnosis not present

## 2020-11-03 DIAGNOSIS — T148XXA Other injury of unspecified body region, initial encounter: Secondary | ICD-10-CM

## 2020-11-03 DIAGNOSIS — F410 Panic disorder [episodic paroxysmal anxiety] without agoraphobia: Secondary | ICD-10-CM | POA: Diagnosis present

## 2020-11-03 DIAGNOSIS — Z20822 Contact with and (suspected) exposure to covid-19: Secondary | ICD-10-CM | POA: Diagnosis present

## 2020-11-03 DIAGNOSIS — Z981 Arthrodesis status: Secondary | ICD-10-CM

## 2020-11-03 DIAGNOSIS — E876 Hypokalemia: Secondary | ICD-10-CM | POA: Diagnosis present

## 2020-11-03 DIAGNOSIS — M5134 Other intervertebral disc degeneration, thoracic region: Secondary | ICD-10-CM | POA: Diagnosis present

## 2020-11-03 DIAGNOSIS — K219 Gastro-esophageal reflux disease without esophagitis: Secondary | ICD-10-CM | POA: Diagnosis present

## 2020-11-03 DIAGNOSIS — S72142A Displaced intertrochanteric fracture of left femur, initial encounter for closed fracture: Secondary | ICD-10-CM | POA: Diagnosis present

## 2020-11-03 DIAGNOSIS — J9811 Atelectasis: Secondary | ICD-10-CM | POA: Diagnosis not present

## 2020-11-03 DIAGNOSIS — G894 Chronic pain syndrome: Secondary | ICD-10-CM | POA: Diagnosis present

## 2020-11-03 DIAGNOSIS — I739 Peripheral vascular disease, unspecified: Secondary | ICD-10-CM | POA: Diagnosis present

## 2020-11-03 DIAGNOSIS — Z7982 Long term (current) use of aspirin: Secondary | ICD-10-CM

## 2020-11-03 DIAGNOSIS — S72002A Fracture of unspecified part of neck of left femur, initial encounter for closed fracture: Secondary | ICD-10-CM | POA: Diagnosis not present

## 2020-11-03 DIAGNOSIS — F4310 Post-traumatic stress disorder, unspecified: Secondary | ICD-10-CM | POA: Diagnosis present

## 2020-11-03 DIAGNOSIS — M810 Age-related osteoporosis without current pathological fracture: Secondary | ICD-10-CM | POA: Diagnosis present

## 2020-11-03 DIAGNOSIS — F32A Depression, unspecified: Secondary | ICD-10-CM | POA: Diagnosis present

## 2020-11-03 DIAGNOSIS — Z880 Allergy status to penicillin: Secondary | ICD-10-CM

## 2020-11-03 DIAGNOSIS — W010XXA Fall on same level from slipping, tripping and stumbling without subsequent striking against object, initial encounter: Secondary | ICD-10-CM | POA: Diagnosis present

## 2020-11-03 DIAGNOSIS — M47812 Spondylosis without myelopathy or radiculopathy, cervical region: Secondary | ICD-10-CM | POA: Diagnosis present

## 2020-11-03 DIAGNOSIS — S72009A Fracture of unspecified part of neck of unspecified femur, initial encounter for closed fracture: Secondary | ICD-10-CM | POA: Diagnosis present

## 2020-11-03 DIAGNOSIS — F429 Obsessive-compulsive disorder, unspecified: Secondary | ICD-10-CM | POA: Diagnosis present

## 2020-11-03 DIAGNOSIS — Z79899 Other long term (current) drug therapy: Secondary | ICD-10-CM

## 2020-11-03 DIAGNOSIS — D62 Acute posthemorrhagic anemia: Secondary | ICD-10-CM | POA: Diagnosis not present

## 2020-11-03 DIAGNOSIS — Z8673 Personal history of transient ischemic attack (TIA), and cerebral infarction without residual deficits: Secondary | ICD-10-CM | POA: Diagnosis not present

## 2020-11-03 DIAGNOSIS — J181 Lobar pneumonia, unspecified organism: Secondary | ICD-10-CM | POA: Diagnosis not present

## 2020-11-03 DIAGNOSIS — Y92098 Other place in other non-institutional residence as the place of occurrence of the external cause: Secondary | ICD-10-CM | POA: Diagnosis not present

## 2020-11-03 DIAGNOSIS — D696 Thrombocytopenia, unspecified: Secondary | ICD-10-CM | POA: Diagnosis not present

## 2020-11-03 DIAGNOSIS — Z90710 Acquired absence of both cervix and uterus: Secondary | ICD-10-CM

## 2020-11-03 DIAGNOSIS — R5082 Postprocedural fever: Secondary | ICD-10-CM

## 2020-11-03 DIAGNOSIS — G2581 Restless legs syndrome: Secondary | ICD-10-CM | POA: Diagnosis present

## 2020-11-03 DIAGNOSIS — D649 Anemia, unspecified: Secondary | ICD-10-CM | POA: Diagnosis not present

## 2020-11-03 DIAGNOSIS — D638 Anemia in other chronic diseases classified elsewhere: Secondary | ICD-10-CM | POA: Diagnosis present

## 2020-11-03 HISTORY — DX: Fracture of unspecified part of neck of unspecified femur, initial encounter for closed fracture: S72.009A

## 2020-11-03 LAB — CBC
HCT: 28.8 % — ABNORMAL LOW (ref 36.0–46.0)
HCT: 26.3 % — ABNORMAL LOW (ref 36.0–46.0)
Hemoglobin: 10.5 g/dL — ABNORMAL LOW (ref 12.0–15.0)
Hemoglobin: 9.4 g/dL — ABNORMAL LOW (ref 12.0–15.0)
MCH: 33.4 pg (ref 26.0–34.0)
MCH: 33 pg (ref 26.0–34.0)
MCHC: 36.5 g/dL — ABNORMAL HIGH (ref 30.0–36.0)
MCHC: 35.7 g/dL (ref 30.0–36.0)
MCV: 91.7 fL (ref 80.0–100.0)
MCV: 92.3 fL (ref 80.0–100.0)
Platelets: 158 10*3/uL (ref 150–400)
Platelets: 151 10*3/uL (ref 150–400)
RBC: 3.14 MIL/uL — ABNORMAL LOW (ref 3.87–5.11)
RBC: 2.85 MIL/uL — ABNORMAL LOW (ref 3.87–5.11)
RDW: 12.8 % (ref 11.5–15.5)
RDW: 12.6 % (ref 11.5–15.5)
WBC: 5.2 10*3/uL (ref 4.0–10.5)
WBC: 12.6 10*3/uL — ABNORMAL HIGH (ref 4.0–10.5)
nRBC: 0 % (ref 0.0–0.2)
nRBC: 0 % (ref 0.0–0.2)

## 2020-11-03 LAB — BASIC METABOLIC PANEL
Anion gap: 6 (ref 5–15)
BUN: 16 mg/dL (ref 8–23)
CO2: 27 mmol/L (ref 22–32)
Calcium: 8.6 mg/dL — ABNORMAL LOW (ref 8.9–10.3)
Chloride: 105 mmol/L (ref 98–111)
Creatinine, Ser: 0.72 mg/dL (ref 0.44–1.00)
GFR, Estimated: >60 mL/min (ref >60)
Glucose, Bld: 89 mg/dL (ref 70–99)
Potassium: 4 mmol/L (ref 3.5–5.1)
Sodium: 138 mmol/L (ref 135–145)

## 2020-11-03 LAB — CREATININE, SERUM
Creatinine, Ser: 0.58 mg/dL (ref 0.44–1.00)
GFR, Estimated: >60 mL/min (ref >60)

## 2020-11-03 LAB — TYPE AND SCREEN
ABO/RH(D): O POS
Antibody Screen: NEGATIVE

## 2020-11-03 LAB — RESP PANEL BY RT-PCR (FLU A&B, COVID) ARPGX2
Influenza A by PCR: NEGATIVE
Influenza B by PCR: NEGATIVE
SARS Coronavirus 2 by RT PCR: NEGATIVE

## 2020-11-03 LAB — PROTIME-INR
INR: 0.9 (ref 0.8–1.2)
Prothrombin Time: 12.2 seconds (ref 11.4–15.2)

## 2020-11-03 MED ORDER — SENNOSIDES-DOCUSATE SODIUM 8.6-50 MG PO TABS: 1.0000 | ORAL_TABLET | Freq: Every evening | ORAL | Status: DC | PRN

## 2020-11-03 MED ORDER — CEFAZOLIN SODIUM-DEXTROSE 2-4 GM/100ML-% IV SOLN
2.0000 g | Freq: Once | INTRAVENOUS | Status: DC
  Filled 2020-11-03 (×2): qty 100

## 2020-11-03 MED ORDER — MORPHINE SULFATE (PF) 2 MG/ML IV SOLN: 2.0000 mg | INTRAVENOUS | Status: DC | PRN

## 2020-11-03 MED ORDER — HYDROCODONE-ACETAMINOPHEN 5-325 MG PO TABS: 1.0000 | ORAL_TABLET | Freq: Four times a day (QID) | ORAL | Status: DC | PRN

## 2020-11-03 MED ORDER — GABAPENTIN 400 MG PO CAPS
800.0000 mg | ORAL_CAPSULE | Freq: Three times a day (TID) | ORAL | Status: DC
  Administered 2020-11-03 – 2020-11-12 (×24): 800 mg via ORAL
  Filled 2020-11-03 (×24): qty 2

## 2020-11-03 MED ORDER — FENTANYL CITRATE (PF) 100 MCG/2ML IJ SOLN
50.0000 ug | INTRAMUSCULAR | Status: DC | PRN
  Administered 2020-11-03: 50 ug via INTRAVENOUS
  Filled 2020-11-03: qty 2

## 2020-11-03 MED ORDER — FENTANYL CITRATE (PF) 100 MCG/2ML IJ SOLN
50.0000 ug | INTRAMUSCULAR | Status: DC | PRN
Start: 2020-11-03 — End: 2020-11-04
  Administered 2020-11-03 (×2): 50 ug via INTRAVENOUS
  Filled 2020-11-03 (×2): qty 2

## 2020-11-03 MED ORDER — RISAQUAD PO CAPS
1.0000 | ORAL_CAPSULE | Freq: Every day | ORAL | Status: DC
  Administered 2020-11-03 – 2020-11-11 (×8): 1 via ORAL
  Filled 2020-11-03 (×8): qty 1

## 2020-11-03 MED ORDER — GABAPENTIN 800 MG PO TABS
800.0000 mg | ORAL_TABLET | Freq: Three times a day (TID) | ORAL | Status: DC
  Filled 2020-11-03: qty 1

## 2020-11-03 MED ORDER — SERTRALINE HCL 50 MG PO TABS
100.0000 mg | ORAL_TABLET | Freq: Two times a day (BID) | ORAL | Status: DC
  Administered 2020-11-03 – 2020-11-12 (×17): 100 mg via ORAL
  Filled 2020-11-03 (×17): qty 2

## 2020-11-03 MED ORDER — OXYCODONE HCL 5 MG PO TABS
10.0000 mg | ORAL_TABLET | ORAL | Status: DC | PRN
  Administered 2020-11-03 – 2020-11-09 (×8): 10 mg via ORAL
  Filled 2020-11-03 (×10): qty 2

## 2020-11-03 MED ORDER — HEPARIN SODIUM (PORCINE) 5000 UNIT/ML IJ SOLN
5000.0000 [IU] | Freq: Three times a day (TID) | INTRAMUSCULAR | Status: DC
  Administered 2020-11-03 – 2020-11-06 (×7): 5000 [IU] via SUBCUTANEOUS
  Filled 2020-11-03 (×6): qty 1

## 2020-11-03 MED ORDER — HYDROMORPHONE HCL 1 MG/ML IJ SOLN
0.5000 mg | Freq: Once | INTRAMUSCULAR | Status: AC
  Administered 2020-11-03: 14:00:00 0.5 mg via INTRAVENOUS
  Filled 2020-11-03: qty 1

## 2020-11-03 MED ORDER — SODIUM CHLORIDE 0.9 % IV SOLN: INTRAVENOUS | Status: DC

## 2020-11-03 MED ORDER — MORPHINE SULFATE (PF) 2 MG/ML IV SOLN: 0.5000 mg | INTRAVENOUS | Status: DC | PRN

## 2020-11-03 MED ORDER — TIZANIDINE HCL 2 MG PO TABS
1.0000 mg | ORAL_TABLET | Freq: Four times a day (QID) | ORAL | Status: DC
  Administered 2020-11-03 – 2020-11-06 (×9): 1 mg via ORAL
  Filled 2020-11-03 (×14): qty 0.5

## 2020-11-03 MED ORDER — PRAZOSIN HCL 2 MG PO CAPS
2.0000 mg | ORAL_CAPSULE | Freq: Three times a day (TID) | ORAL | Status: DC
  Administered 2020-11-03 – 2020-11-12 (×15): 2 mg via ORAL
  Filled 2020-11-03 (×29): qty 1

## 2020-11-03 MED ORDER — ROPINIROLE HCL 1 MG PO TABS
1.0000 mg | ORAL_TABLET | Freq: Every day | ORAL | Status: DC
  Administered 2020-11-03 – 2020-11-11 (×9): 1 mg via ORAL
  Filled 2020-11-03 (×10): qty 1

## 2020-11-03 MED ORDER — HYDROMORPHONE HCL 1 MG/ML IJ SOLN
1.0000 mg | Freq: Once | INTRAMUSCULAR | Status: AC
  Administered 2020-11-03: 21:00:00 1 mg via INTRAVENOUS
  Filled 2020-11-03: qty 1

## 2020-11-03 MED ORDER — ONDANSETRON HCL 4 MG/2ML IJ SOLN
4.0000 mg | Freq: Four times a day (QID) | INTRAMUSCULAR | Status: DC
  Administered 2020-11-03 – 2020-11-04 (×3): 4 mg via INTRAVENOUS
  Filled 2020-11-03 (×4): qty 2

## 2020-11-03 MED ORDER — QUETIAPINE FUMARATE 25 MG PO TABS
25.0000 mg | ORAL_TABLET | Freq: Every day | ORAL | Status: DC
  Administered 2020-11-03 – 2020-11-11 (×9): 25 mg via ORAL
  Filled 2020-11-03 (×9): qty 1

## 2020-11-03 MED ORDER — PANTOPRAZOLE SODIUM 40 MG PO TBEC
40.0000 mg | DELAYED_RELEASE_TABLET | Freq: Every day | ORAL | Status: DC
  Administered 2020-11-03 – 2020-11-12 (×9): 40 mg via ORAL
  Filled 2020-11-03 (×9): qty 1
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
## 2020-11-03 NOTE — ED Notes (Signed)
MD aware of patient requesting something for muscle spasms.

## 2020-11-03 NOTE — ED Provider Notes (Signed)
[facility] Emergency Department Provider Note   ____________________________________________    I have reviewed the triage vital signs and the nursing notes.   HISTORY  Chief Complaint Fall     HPI Sandra Mcconnell is a 79 y.o. female with past medical history as detailed below who presents after mechanical fall.  Patient reports she missed a step and fell backwards onto the floor.  She reports she hit her head, she has some neck discomfort.  Also describes some thoracic back discomfort but primarily complains of pain in her left hip which is severe.  No LOC.  No nausea or vomiting or abdominal pain.  Not on blood thinners.  Past Medical History:  Diagnosis Date  . Anemia   . Anemia 08/04/2017  . Anemia 08/04/2017  . Anxiety   . Carpal tunnel syndrome   . Carpal tunnel syndrome   . Cataract cortical, senile   . 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 Chicken pox   . Colitis   . Complication of anesthesia   . Depression   . Dizziness   . DJD (degenerative joint disease)   . Dysphagia   . GERD (gastroesophageal reflux disease)   . Hemorrhoids   . History of hiatal hernia   . History of palpitations    EMOTIONAL PALPITATIONS  . IBS (irritable bowel syndrome)   . Iritis   . Migraines   . Neuropathy   . OCD (obsessive compulsive disorder)   . Osteoarthritis   . Osteoporosis   . Panic attacks   . PONV (postoperative nausea and vomiting)   . Psoriasis   . Psoriasis   . Redundant colon   . REM sleep behavior disorder   . Sleep apnea   . Sleep disorder    REM SLEEP DISORDER  . Slow transit constipation   . Spondylosis    CERVICAL,SLEEPS WITH HOB ELEVATED AND WEARS NECK SUPPORT  . Spondylosis of cervical joint   . Spondylosis of cervical joint     Patient Active Problem List   Diagnosis Date Noted  . Lactose intolerance 11/30/2017  . Slow transit constipation 08/04/2017  . 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 Restless leg  syndrome 02/20/2015  . OA (osteoarthritis) 07/25/2014  . Anxiety 05/14/2014  . Depression 05/14/2014  . GERD (gastroesophageal reflux disease) 05/14/2014  . 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 Headache 03/14/2014  . Other and unspecified hyperlipidemia 03/14/2014  . 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 Urinary system disease 06/28/2013  . 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 FOM (frequency of micturition) 06/28/2013  . 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 Sleep apnea 06/18/2012    Past Surgical History:  Procedure Laterality Date  . ABDOMINAL HYSTERECTOMY    . BACK SURGERY     CERVICAL FUSION 2003/DECOMPRESSION 2014 LUMBAR LAM 2009  . CATARACT EXTRACTION W/PHACO Right 12/08/2015   Procedure: CATARACT EXTRACTION PHACO AND INTRAOCULAR LENS PLACEMENT (IOC);  Surgeon: Birder Robson, MD;  Location: [facility] ORS;  Service: Ophthalmology;  Laterality: Right;  [place] 01:09 AP% 22.6 CDE 15.81 fluid pack lot # [id number]  . COLONOSCOPY    . COLONOSCOPY WITH PROPOFOL N/A 11/15/2017   Procedure: COLONOSCOPY WITH PROPOFOL;  Surgeon: Manya Silvas, MD;  Location: [facility] ENDOSCOPY;  Service: Endoscopy;  Laterality: N/A;  . ESOPHAGOGASTRODUODENOSCOPY    . ESOPHAGOGASTRODUODENOSCOPY N/A 11/15/2017   Procedure: ESOPHAGOGASTRODUODENOSCOPY (EGD);  Surgeon: Manya Silvas, MD;  Location: [facility] ENDOSCOPY;  Service: Endoscopy;  Laterality: N/A;  . EYE SURGERY    . FOOT FUSION Bilateral   . HAND SURGERY    . SPINE SURGERY    . TONSILLECTOMY    . 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 Authorizing Provider  acetaminophen (TYLENOL) 325 MG tablet Take 650 mg by mouth 3 (three) times daily.    [provider]  Aspirin-Calcium Carbonate 81-777 MG TABS Take 1 tablet by mouth daily. 08/27/08   [provider]  betamethasone dipropionate  (DIPROLENE) 0.05 % ointment  09/17/14   [provider]  buprenorphine (BUTRANS) 7.5 MCG/HR  08/31/20   [provider]  Calcium Carbonate-Vitamin D 600-400 MG-UNIT tablet Take 1 tablet by mouth 2 (two) times daily.    [provider]  diclofenac sodium (VOLTAREN) 1 % GEL Apply topically. AS NEEDED    [provider]  gabapentin (NEURONTIN) 800 MG tablet  08/31/20   [provider]  Ginger 500 MG CAPS Take by mouth. 11/28/12   [provider]  Glucosamine Sulfate 500 MG CAPS Take 500 mg by mouth 3 (three) times daily.    [provider]  ketorolac (TORADOL) 10 MG tablet Take 10 mg by mouth. NO MORE THAN 5 PER MONTH    [provider]  Multiple Vitamins tablet Take by mouth. 11/28/12   [provider]  omeprazole (PRILOSEC) 20 MG capsule Take 20 mg by mouth daily.  10/16/14   [provider]  ondansetron (ZOFRAN-ODT) 4 MG disintegrating tablet Take by mouth. 10/09/13   [provider]  prazosin (MINIPRESS) 2 MG capsule Take by mouth. 11/30/18   [provider]  Probiotic CAPS Take by mouth.    [provider]  QUEtiapine (SEROQUEL) 25 MG tablet Take by mouth. 12/27/19 09/15/20  [provider]  raloxifene (EVISTA) 60 MG tablet Take 60 mg by mouth daily. 10/16/14   [provider]  rOPINIRole (REQUIP) 0.5 MG tablet Take 1 mg by mouth at bedtime. 10/06/14   [provider]  sertraline (ZOLOFT) 100 MG tablet Take 200 mg by mouth daily.  08/15/14   [provider]  Simethicone 180 MG CAPS Take 180 mg by mouth 2 (two) times daily.    [provider]  tiZANidine (ZANAFLEX) 4 MG tablet 2 mg every 6 (six) hours as needed.  10/22/14   [provider]     Allergies Aripiprazole, Ciprofloxacin, Mirtazapine, and Penicillins  Family History  Problem Relation Age of Onset  . Breast cancer Mother        14's    Social History Social History    Tobacco Use  . Smoking status: Never Smoker  . Smokeless tobacco: Never Used  Vaping Use  . Vaping Use: Never used  Substance Use Topics  . Alcohol use: No    Alcohol/week: 0.0 standard drinks  . Drug use: No    Review of Systems  Constitutional: No fever/chills Eyes: No visual changes.  ENT: No sore throat. Cardiovascular: Denies chest pain. Respiratory: Denies shortness of breath. Gastrointestinal: No abdominal pain.  No nausea, no vomiting.   Genitourinary: Negative for dysuria. Musculoskeletal: As above Skin: Negative for rash. Neurological: Negative for headaches   ____________________________________________   PHYSICAL EXAM:  VITAL SIGNS: ED Triage Vitals [11/03/20 1100]  Enc Vitals Group     BP (!) 108/59     Pulse Rate 62  Resp 16     Temp 97.9 F (36.6 C)     Temp Source Oral     SpO2 95 %     Weight      Height      Head Circumference      Peak Flow      Pain Score      Pain Loc      Pain Edu?      Excl. in [place]?     Constitutional: Alert and oriented.  Eyes: Conjunctivae are normal.  Head: Hematoma posterior scalp Nose: No congestion/rhinnorhea. Mouth/Throat: Mucous membranes are moist.   Neck: C-collar in place Cardiovascular: Normal rate, regular rhythm. Grossly normal heart sounds.  Good peripheral circulation. Respiratory: Normal respiratory effort.  No retractions. Lungs CTAB. Gastrointestinal: Soft and nontender. No distention.   Genitourinary: deferred Musculoskeletal: Left leg held flexed, tender to palpation along the lateral hip, warm and well perfused distally.  Mild tenderness palpation of the thoracic spine, no chest wall tenderness palpation, upper extremities without pain.  Right lower extremity normal Neurologic:  Normal speech and language. No gross focal neurologic deficits are appreciated.  Skin:  Skin is warm, dry and intact. No rash noted. Psychiatric: Mood and affect are normal. Speech and behavior are  normal.  ____________________________________________   LABS (all labs ordered are listed, but only abnormal results are displayed)  Labs Reviewed  CBC - Abnormal; Notable for the following components:      Result Value   RBC 3.14 (*)    Hemoglobin 10.5 (*)    HCT 28.8 (*)    MCHC 36.5 (*)    All other components within normal limits  BASIC METABOLIC PANEL - Abnormal; Notable for the following components:   Calcium 8.6 (*)    All other components within normal limits  PROTIME-INR  TYPE AND SCREEN   ____________________________________________  EKG  None ____________________________________________  RADIOLOGY  CT head and cervical spine unremarkable Hip x-ray ____________________________________________   PROCEDURES  Procedure(s) performed: No  Procedures   Critical Care performed: No ____________________________________________   INITIAL IMPRESSION / ASSESSMENT AND PLAN / ED COURSE  Pertinent labs & imaging results that were available during my care of the patient were reviewed by me and considered in my medical decision making (see chart for details).  Patient presents after mechanical fall with injuries as noted.  Highly concerning for hip fracture.  Not on blood thinners.  Will send for CT head, cervical spine, thoracic spine x-ray, hip x-ray/pelvis x-ray.  Treated with IV fentanyl for pain  Lab work thus far is quite reassuring, CBC BMP normal.  CT head and cervical spine are unremarkable.  Hip x-ray reviewed by me, left intertrochanteric fracture, discussed with Dr. Rudene Christians of orthopedics, he will consult, discussed with hospitalist for admission  Patient with continued severe pain, will give 0.5 mg of Dilaudid    ____________________________________________   FINAL CLINICAL IMPRESSION(S) / ED DIAGNOSES  Final diagnoses:  Closed fracture of left hip, initial encounter [facility])        Note:  This document was prepared using Dragon voice  recognition software and may include unintentional dictation errors.   Lavonia Drafts, MD 11/03/20 [phone]

## 2020-11-04 ENCOUNTER — Inpatient Hospital Stay: Payer: Medicare PPO | Admitting: Anesthesiology

## 2020-11-04 ENCOUNTER — Other Ambulatory Visit: Payer: Self-pay

## 2020-11-04 ENCOUNTER — Encounter: Payer: Self-pay | Admitting: Internal Medicine

## 2020-11-04 ENCOUNTER — Encounter
Admission: EM | Disposition: A | Discharge status: Nursing Home (Includes ICF) | Payer: Self-pay | Source: Skilled Nursing Facility | Attending: Internal Medicine

## 2020-11-04 ENCOUNTER — Inpatient Hospital Stay: Payer: Medicare PPO

## 2020-11-04 DIAGNOSIS — D649 Anemia, unspecified: Secondary | ICD-10-CM

## 2020-11-04 DIAGNOSIS — S72002A Fracture of unspecified part of neck of left femur, initial encounter for closed fracture: Secondary | ICD-10-CM | POA: Diagnosis not present

## 2020-11-04 DIAGNOSIS — E876 Hypokalemia: Secondary | ICD-10-CM | POA: Diagnosis not present

## 2020-11-04 HISTORY — PX: INTRAMEDULLARY (IM) NAIL INTERTROCHANTERIC: SHX5875

## 2020-11-04 LAB — CBC
HCT: 27.4 % — ABNORMAL LOW (ref 36.0–46.0)
Hemoglobin: 9.9 g/dL — ABNORMAL LOW (ref 12.0–15.0)
MCH: 33.6 pg (ref 26.0–34.0)
MCHC: 36.1 g/dL — ABNORMAL HIGH (ref 30.0–36.0)
MCV: 92.9 fL (ref 80.0–100.0)
Platelets: 154 10*3/uL (ref 150–400)
RBC: 2.95 MIL/uL — ABNORMAL LOW (ref 3.87–5.11)
RDW: 12.8 % (ref 11.5–15.5)
WBC: 10.1 10*3/uL (ref 4.0–10.5)
nRBC: 0 % (ref 0.0–0.2)

## 2020-11-04 LAB — BASIC METABOLIC PANEL
Anion gap: 8 (ref 5–15)
BUN: 17 mg/dL (ref 8–23)
CO2: 29 mmol/L (ref 22–32)
Calcium: 8.1 mg/dL — ABNORMAL LOW (ref 8.9–10.3)
Chloride: 102 mmol/L (ref 98–111)
Creatinine, Ser: 0.7 mg/dL (ref 0.44–1.00)
GFR, Estimated: >60 mL/min (ref >60)
Glucose, Bld: 143 mg/dL — ABNORMAL HIGH (ref 70–99)
Potassium: 3.4 mmol/L — ABNORMAL LOW (ref 3.5–5.1)
Sodium: 139 mmol/L (ref 135–145)

## 2020-11-04 SURGERY — FIXATION, FRACTURE, INTERTROCHANTERIC, WITH INTRAMEDULLARY ROD
Anesthesia: General | Laterality: Left

## 2020-11-04 MED ORDER — CEFAZOLIN SODIUM-DEXTROSE 2-4 GM/100ML-% IV SOLN
2.0000 g | Freq: Four times a day (QID) | INTRAVENOUS | Status: AC
  Administered 2020-11-04 – 2020-11-05 (×3): 2 g via INTRAVENOUS
  Filled 2020-11-04 (×3): qty 100

## 2020-11-04 MED ORDER — NEOMYCIN-POLYMYXIN B GU 40-200000 IR SOLN
Status: DC | PRN
  Administered 2020-11-04: 2 mL

## 2020-11-04 MED ORDER — HYDROMORPHONE HCL 1 MG/ML IJ SOLN
1.0000 mg | INTRAMUSCULAR | Status: DC | PRN
  Administered 2020-11-04 – 2020-11-06 (×4): 1 mg via INTRAVENOUS
  Filled 2020-11-04 (×4): qty 1

## 2020-11-04 MED ORDER — ROCURONIUM BROMIDE 100 MG/10ML IV SOLN
INTRAVENOUS | Status: DC | PRN
  Administered 2020-11-04: 50 mg via INTRAVENOUS

## 2020-11-04 MED ORDER — ONDANSETRON HCL 4 MG/2ML IJ SOLN
INTRAMUSCULAR | Status: AC
  Filled 2020-11-04: qty 2

## 2020-11-04 MED ORDER — PHENYLEPHRINE HCL (PRESSORS) 10 MG/ML IV SOLN
INTRAVENOUS | Status: DC | PRN
  Administered 2020-11-04 (×2): 200 ug via INTRAVENOUS
  Administered 2020-11-04 (×2): 100 ug via INTRAVENOUS

## 2020-11-04 MED ORDER — EPHEDRINE SULFATE 50 MG/ML IJ SOLN
INTRAMUSCULAR | Status: DC | PRN
  Administered 2020-11-04 (×2): 15 mg via INTRAVENOUS

## 2020-11-04 MED ORDER — METHOCARBAMOL 500 MG PO TABS
500.0000 mg | ORAL_TABLET | Freq: Four times a day (QID) | ORAL | Status: DC | PRN
  Administered 2020-11-04 – 2020-11-10 (×5): 500 mg via ORAL
  Filled 2020-11-04 (×5): qty 1

## 2020-11-04 MED ORDER — DEXAMETHASONE SODIUM PHOSPHATE 10 MG/ML IJ SOLN
INTRAMUSCULAR | Status: DC | PRN
  Administered 2020-11-04: 10 mg via INTRAVENOUS

## 2020-11-04 MED ORDER — SODIUM CHLORIDE 0.9 % IV BOLUS
1000.0000 mL | Freq: Once | INTRAVENOUS | Status: AC
  Administered 2020-11-04: 10:00:00 1000 mL via INTRAVENOUS

## 2020-11-04 MED ORDER — FENTANYL CITRATE (PF) 100 MCG/2ML IJ SOLN
25.0000 ug | INTRAMUSCULAR | Status: DC | PRN
  Administered 2020-11-04: 25 ug via INTRAVENOUS

## 2020-11-04 MED ORDER — METOCLOPRAMIDE HCL 10 MG PO TABS: 5.0000 mg | ORAL_TABLET | Freq: Three times a day (TID) | ORAL | Status: DC | PRN

## 2020-11-04 MED ORDER — ADULT MULTIVITAMIN W/MINERALS CH
1.0000 | ORAL_TABLET | Freq: Every day | ORAL | Status: DC
  Administered 2020-11-05 – 2020-11-12 (×8): 1 via ORAL
  Filled 2020-11-04 (×8): qty 1

## 2020-11-04 MED ORDER — LIDOCAINE HCL (CARDIAC) PF 100 MG/5ML IV SOSY
PREFILLED_SYRINGE | INTRAVENOUS | Status: DC | PRN
  Administered 2020-11-04: 80 mg via INTRAVENOUS

## 2020-11-04 MED ORDER — EPHEDRINE 5 MG/ML INJ
INTRAVENOUS | Status: AC
  Filled 2020-11-04: qty 10

## 2020-11-04 MED ORDER — SUGAMMADEX SODIUM 200 MG/2ML IV SOLN
INTRAVENOUS | Status: DC | PRN
  Administered 2020-11-04: 200 mg via INTRAVENOUS

## 2020-11-04 MED ORDER — ZOLPIDEM TARTRATE 5 MG PO TABS: 5.0000 mg | ORAL_TABLET | Freq: Every evening | ORAL | Status: DC | PRN

## 2020-11-04 MED ORDER — ONDANSETRON HCL 4 MG/2ML IJ SOLN: 4.0000 mg | Freq: Four times a day (QID) | INTRAMUSCULAR | Status: DC | PRN

## 2020-11-04 MED ORDER — ACETAMINOPHEN 10 MG/ML IV SOLN
INTRAVENOUS | Status: DC | PRN
  Administered 2020-11-04: 1000 mg via INTRAVENOUS

## 2020-11-04 MED ORDER — DEXAMETHASONE SODIUM PHOSPHATE 10 MG/ML IJ SOLN
INTRAMUSCULAR | Status: AC
  Filled 2020-11-04: qty 1

## 2020-11-04 MED ORDER — MIDAZOLAM HCL 2 MG/2ML IJ SOLN
INTRAMUSCULAR | Status: AC
  Filled 2020-11-04: qty 2

## 2020-11-04 MED ORDER — SODIUM CHLORIDE 0.9 % IV SOLN: INTRAVENOUS | Status: DC

## 2020-11-04 MED ORDER — MIDAZOLAM HCL 2 MG/2ML IJ SOLN
INTRAMUSCULAR | Status: DC | PRN
  Administered 2020-11-04: 1 mg via INTRAVENOUS

## 2020-11-04 MED ORDER — ACETAMINOPHEN 10 MG/ML IV SOLN
INTRAVENOUS | Status: AC
  Filled 2020-11-04: qty 100

## 2020-11-04 MED ORDER — FENTANYL CITRATE (PF) 100 MCG/2ML IJ SOLN
INTRAMUSCULAR | Status: AC
  Filled 2020-11-04: qty 2

## 2020-11-04 MED ORDER — NEOMYCIN-POLYMYXIN B GU 40-200000 IR SOLN
Status: AC
  Filled 2020-11-04: qty 2

## 2020-11-04 MED ORDER — HYDROMORPHONE HCL 1 MG/ML IJ SOLN
INTRAMUSCULAR | Status: DC | PRN
  Administered 2020-11-04: 1 mg via INTRAVENOUS

## 2020-11-04 MED ORDER — SENNA 8.6 MG PO TABS
1.0000 | ORAL_TABLET | Freq: Two times a day (BID) | ORAL | Status: DC
  Administered 2020-11-04 – 2020-11-12 (×16): 8.6 mg via ORAL
  Filled 2020-11-04 (×16): qty 1

## 2020-11-04 MED ORDER — MAGNESIUM CITRATE PO SOLN
1.0000 | Freq: Once | ORAL | Status: AC | PRN
  Administered 2020-11-10: 1 via ORAL
  Filled 2020-11-04 (×2): qty 296

## 2020-11-04 MED ORDER — SODIUM CHLORIDE 0.9 % IV BOLUS
500.0000 mL | Freq: Once | INTRAVENOUS | Status: AC
  Administered 2020-11-04: 02:00:00 500 mL via INTRAVENOUS

## 2020-11-04 MED ORDER — PROPOFOL 10 MG/ML IV BOLUS
INTRAVENOUS | Status: AC
  Filled 2020-11-04: qty 20

## 2020-11-04 MED ORDER — METOCLOPRAMIDE HCL 5 MG/ML IJ SOLN
5.0000 mg | Freq: Three times a day (TID) | INTRAMUSCULAR | Status: DC | PRN
Start: 2020-11-04 — End: 2020-11-12

## 2020-11-04 MED ORDER — ROCURONIUM BROMIDE 10 MG/ML (PF) SYRINGE
PREFILLED_SYRINGE | INTRAVENOUS | Status: AC
  Filled 2020-11-04: qty 10

## 2020-11-04 MED ORDER — FENTANYL CITRATE (PF) 100 MCG/2ML IJ SOLN
INTRAMUSCULAR | Status: DC | PRN
  Administered 2020-11-04 (×2): 50 ug via INTRAVENOUS

## 2020-11-04 MED ORDER — PROPOFOL 10 MG/ML IV BOLUS
INTRAVENOUS | Status: DC | PRN
  Administered 2020-11-04: 150 mg via INTRAVENOUS

## 2020-11-04 MED ORDER — METHOCARBAMOL 1000 MG/10ML IJ SOLN
500.0000 mg | Freq: Four times a day (QID) | INTRAVENOUS | Status: DC | PRN
  Filled 2020-11-04: qty 5

## 2020-11-04 MED ORDER — CEFAZOLIN SODIUM-DEXTROSE 2-3 GM-%(50ML) IV SOLR
INTRAVENOUS | Status: DC | PRN
  Administered 2020-11-04: 2 g via INTRAVENOUS

## 2020-11-04 MED ORDER — HYDROMORPHONE HCL 1 MG/ML IJ SOLN
INTRAMUSCULAR | Status: AC
  Filled 2020-11-04: qty 1

## 2020-11-04 MED ORDER — POTASSIUM CHLORIDE CRYS ER 20 MEQ PO TBCR: 20.0000 meq | EXTENDED_RELEASE_TABLET | Freq: Once | ORAL | Status: DC

## 2020-11-04 MED ORDER — ONDANSETRON HCL 4 MG/2ML IJ SOLN
INTRAMUSCULAR | Status: DC | PRN
  Administered 2020-11-04: 4 mg via INTRAVENOUS

## 2020-11-04 MED ORDER — CHLORHEXIDINE GLUCONATE CLOTH 2 % EX PADS
6.0000 | MEDICATED_PAD | Freq: Every day | CUTANEOUS | Status: DC
  Administered 2020-11-08 – 2020-11-11 (×3): 6 via TOPICAL

## 2020-11-04 MED ORDER — BUPRENORPHINE 7.5 MCG/HR TD PTWK
1.0000 | MEDICATED_PATCH | TRANSDERMAL | Status: DC
  Administered 2020-11-09: 1 via TRANSDERMAL
  Filled 2020-11-04 (×3): qty 1

## 2020-11-04 MED ORDER — MAGNESIUM HYDROXIDE 400 MG/5ML PO SUSP: 30.0000 mL | Freq: Every day | ORAL | Status: DC | PRN

## 2020-11-04 MED ORDER — ONDANSETRON HCL 4 MG PO TABS: 4.0000 mg | ORAL_TABLET | Freq: Four times a day (QID) | ORAL | Status: DC | PRN

## 2020-11-04 MED ORDER — PROPOFOL 500 MG/50ML IV EMUL
INTRAVENOUS | Status: AC
  Filled 2020-11-04: qty 50

## 2020-11-04 MED ORDER — BISACODYL 10 MG RE SUPP
10.0000 mg | Freq: Every day | RECTAL | Status: DC | PRN
  Administered 2020-11-09: 10 mg via RECTAL
  Filled 2020-11-04: qty 1

## 2020-11-04 MED ORDER — LIDOCAINE HCL (PF) 2 % IJ SOLN
INTRAMUSCULAR | Status: AC
  Filled 2020-11-04: qty 5

## 2020-11-04 MED ORDER — ONDANSETRON HCL 4 MG/2ML IJ SOLN: 4.0000 mg | Freq: Once | INTRAMUSCULAR | Status: DC | PRN

## 2020-11-04 MED ORDER — ENSURE ENLIVE PO LIQD
237.0000 mL | Freq: Two times a day (BID) | ORAL | Status: DC
  Administered 2020-11-08 – 2020-11-10 (×5): 237 mL via ORAL

## 2020-11-04 SURGICAL SUPPLY — 40 items
BIT DRILL 4.3MMS DISTAL GRDTED (BIT) ×1 IMPLANT
BIT DRILL LAG SCREW (DRILL) ×1 IMPLANT
CABLE CERLAGE W/CRIMP 1.8 (Cable) ×2 IMPLANT
CANISTER SUCT 1200ML W/VALVE (MISCELLANEOUS) ×2 IMPLANT
CHLORAPREP W/TINT 26 (MISCELLANEOUS) ×2 IMPLANT
COVER WAND RF STERILE (DRAPES) ×2 IMPLANT
DRAPE 3/4 80X56 (DRAPES) ×2 IMPLANT
DRAPE U-SHAPE 47X51 STRL (DRAPES) ×2 IMPLANT
DRILL 4.3MMS DISTAL GRADUATED (BIT) ×2
DRILL LAG SCREW (DRILL) ×2
DRSG OPSITE POSTOP 3X4 (GAUZE/BANDAGES/DRESSINGS) ×4 IMPLANT
DRSG OPSITE POSTOP 4X6 (GAUZE/BANDAGES/DRESSINGS) ×2 IMPLANT
GLOVE BIOGEL PI IND STRL 9 (GLOVE) ×1 IMPLANT
GLOVE BIOGEL PI INDICATOR 9 (GLOVE) ×1
GLOVE SURG SYN 9.0  PF PI (GLOVE) ×1
GLOVE SURG SYN 9.0 PF PI (GLOVE) ×1 IMPLANT
GOWN SRG 2XL LVL 4 RGLN SLV (GOWNS) ×1 IMPLANT
GOWN STRL NON-REIN 2XL LVL4 (GOWNS) ×1
GOWN STRL REUS W/ TWL LRG LVL3 (GOWN DISPOSABLE) ×1 IMPLANT
GOWN STRL REUS W/TWL LRG LVL3 (GOWN DISPOSABLE) ×1
GUIDEPIN VERSANAIL DSP 3.2X444 (ORTHOPEDIC DISPOSABLE SUPPLIES) ×2 IMPLANT
GUIDEWIRE BALL NOSE 100CM (WIRE) ×2 IMPLANT
HFN LH 130 DEG 9MM X 320MM (Nail) ×2 IMPLANT
HIP FRAC NAIL LAG SCR 10.5X100 (Orthopedic Implant) ×1 IMPLANT
KIT TURNOVER KIT A (KITS) ×2 IMPLANT
MANIFOLD NEPTUNE II (INSTRUMENTS) ×2 IMPLANT
MAT ABSORB  FLUID 56X50 GRAY (MISCELLANEOUS) ×1
MAT ABSORB FLUID 56X50 GRAY (MISCELLANEOUS) ×1 IMPLANT
NEEDLE FILTER BLUNT 18X 1/2SAF (NEEDLE) ×1
NEEDLE FILTER BLUNT 18X1 1/2 (NEEDLE) ×1 IMPLANT
NS IRRIG 500ML POUR BTL (IV SOLUTION) ×2 IMPLANT
PACK HIP COMPR (MISCELLANEOUS) ×2 IMPLANT
SCALPEL PROTECTED #15 DISP (BLADE) ×4 IMPLANT
SCREW BONE CORTICAL 5.0X54 (Screw) ×2 IMPLANT
SCREW CANN THRD AFF 10.5X100 (Orthopedic Implant) ×1 IMPLANT
STAPLER SKIN PROX 35W (STAPLE) ×2 IMPLANT
SUT VIC AB 1 CT1 36 (SUTURE) IMPLANT
SUT VIC AB 2-0 CT1 (SUTURE) ×4 IMPLANT
SYR 10ML LL (SYRINGE) ×2 IMPLANT
SYR BULB IRRIG 60ML STRL (SYRINGE) ×2 IMPLANT

## 2020-11-04 NOTE — Progress Notes (Signed)
Initial Nutrition Assessment  DOCUMENTATION CODES:   Not applicable  INTERVENTION:   Ensure Enlive po BID with diet advancement, each supplement provides 350 kcal and 20 grams of protein  MVI with minerals daily   NUTRITION DIAGNOSIS:   Increased nutrient needs related to hip fracture,post-op healing as evidenced by estimated needs.  GOAL:   Patient will meet greater than or equal to 90% of their needs  MONITOR:   PO intake,Supplement acceptance,Weight trends,Labs  REASON FOR ASSESSMENT:   Consult Hip fracture protocol  ASSESSMENT:   79 y.o. female with PMH of OCD/PTSD/Anxiety, chronic pain syndrome, IBS, anemia, and GERD who presents with L proximal femur fracture after falling at home.  Spoke with pt at bedside. Husband was present during visit. Pt reports her appetite was good before falling. She consumes 2-3 meals a day. Typical intake: B: toast with jam, yogurt, and fruit, L: chicken salad or tuna salad, D: a snack consisting of popcorn. Pt stated that she does not eat red meat and that she is lactose intolerant. She reports she takes lactase pills when she is going to consume dairy products. She reports she only drinks water and ginger tea at home. She reports feeling hungry and thirsty currently d/t NPO status. Pt educated on the importance of consuming enough protein for healing after surgery. Pt exhibited understanding and was agreeable to trying Ensure during admission. Pt reports she has dentures. Denies any problems with the fit. Denies any problems chewing or swallowing.   Pt reports no changes in weight. She reports her UBW is around 133-135 lbs. Weight of 63.8 kg (140 lbs) was taken using bed scale during exam. Per chart review, pt weight has been stable over the last few years.  Pt in distress during RD visit. Pt complaining of severe muscle spasms. RN notified.   Labs reviewed: Glucose: 143, potassium 3.4  Medications reviewed and include: Protonix,  Klor-con, NS at 75 mL/hr   NUTRITION - FOCUSED PHYSICAL EXAM:  Flowsheet Row Most Recent Value  Orbital Region No depletion  Upper Arm Region Mild depletion  Thoracic and Lumbar Region No depletion  Buccal Region No depletion  Temple Region Mild depletion  Clavicle Bone Region No depletion  Clavicle and Acromion Bone Region No depletion  Scapular Bone Region Unable to assess  Dorsal Hand Unable to assess  [pt wearing gloves]  Patellar Region Mild depletion  Anterior Thigh Region No depletion  Posterior Calf Region Mild depletion  Edema (RD Assessment) Mild  Hair Reviewed  Eyes Reviewed  Mouth Reviewed  Skin Reviewed  Nails Unable to assess  [pt wearing gloves]       Diet Order:   Diet Order            Diet NPO time specified  Diet effective midnight                 EDUCATION NEEDS:   Education needs have been addressed  Skin:  Skin Assessment: Reviewed RN Assessment  Last BM:  unknown  Height:   Ht Readings from Last 1 Encounters:  09/15/20 4\' 10"  (1.473 m)    Weight:   Wt Readings from Last 1 Encounters:  11/04/20 63.8 kg    BMI:  Body mass index is 29.4 kg/m.  Estimated Nutritional Needs:   Kcal:  1700-1900 kcal  Protein:  90-105 grams  Fluid:  > 1.7 L/day    11/06/20, Dietetic Intern Pager: [phone] If unavailable: [phone]

## 2020-11-04 NOTE — Progress Notes (Signed)
Pts BP 91/51. MD notified.

## 2020-11-04 NOTE — Anesthesia Preprocedure Evaluation (Addendum)
Anesthesia Evaluation  Patient identified by MRN, date of birth, ID band Patient awake    Reviewed: Allergy & Precautions, NPO status , Patient's Chart, lab work & pertinent test results  History of Anesthesia Complications (+) PONV and history of anesthetic complications  Airway Mallampati: III  TM Distance: <3 FB Neck ROM: Limited   Comment: Very limited Dental   Pulmonary sleep apnea and Continuous Positive Airway Pressure Ventilation ,    Pulmonary exam normal breath sounds clear to auscultation       Cardiovascular + Peripheral Vascular Disease  Normal cardiovascular exam+ dysrhythmias      Neuro/Psych  Headaches, PSYCHIATRIC DISORDERS Anxiety Depression Cervical cord compression/ neuropathy  Neuromuscular disease    GI/Hepatic Neg liver ROS, hiatal hernia, GERD  Medicated and Controlled,Colitis Hx   Endo/Other  neg diabetes  Renal/GU negative Renal ROS     Musculoskeletal  (+) Arthritis , Osteoarthritis,    Abdominal Normal abdominal exam  (+)   Peds negative pediatric ROS (+)  Hematology  (+) anemia ,   Anesthesia Other Findings Past Medical History: No date: Anemia 08/04/2017: Anemia 08/04/2017: Anemia No date: Anxiety No date: Carpal tunnel syndrome No date: Carpal tunnel syndrome No date: Cataract cortical, senile No date: Cervical spinal cord compression (HCC) No date: Chicken pox No date: Colitis No date: Complication of anesthesia No date: Depression No date: Dizziness No date: DJD (degenerative joint disease) No date: Dysphagia No date: GERD (gastroesophageal reflux disease) No date: Hemorrhoids No date: History of hiatal hernia No date: History of palpitations     Comment:  EMOTIONAL PALPITATIONS No date: IBS (irritable bowel syndrome) No date: Iritis No date: Migraines No date: Neuropathy No date: OCD (obsessive compulsive disorder) No date: Osteoarthritis No date: Osteoporosis No  date: Panic attacks No date: PONV (postoperative nausea and vomiting) No date: Psoriasis No date: Psoriasis No date: Redundant colon 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 No date: Sleep apnea No date: Sleep disorder     Comment:  REM SLEEP DISORDER No date: Slow transit constipation No date: Spondylosis     Comment:  CERVICAL,SLEEPS WITH HOB ELEVATED AND WEARS NECK SUPPORT No date: Spondylosis of cervical joint No date: Spondylosis of cervical joint  Reproductive/Obstetrics                            Anesthesia Physical  Anesthesia Plan  ASA: III  Anesthesia Plan: General   Post-op Pain Management:    Induction: Intravenous  PONV Risk Score and Plan:   Airway Management Planned: Oral ETT  Additional Equipment:   Intra-op Plan:   Post-operative Plan: Extubation in OR  Informed Consent: I have reviewed the patients History and Physical, chart, labs and discussed the procedure including the risks, benefits and alternatives for the proposed anesthesia with the patient or authorized representative who has indicated his/her understanding and acceptance.     Dental advisory given  Plan Discussed with: CRNA and Surgeon  Anesthesia Plan Comments: (Pt with fever to 101.2. Will not do spinal secondary to possible spread of infection. Will proceed with GETA with inline traction and video laryngoscope because of her cervical spine issues. Discussed the issue of fever and hardware placement with surgeon who stated that this needed to be done. )     Anesthesia Quick Evaluation

## 2020-11-04 NOTE — Progress Notes (Signed)
BP 108/60 after NS bolus

## 2020-11-04 NOTE — Op Note (Signed)
11/04/2020  5:51 PM  PATIENT:  Sandra Mcconnell  79 y.o. female  PRE-OPERATIVE DIAGNOSIS:  Hip Fracture left comminuted subtrochanteric intratrochanteric  POST-OPERATIVE DIAGNOSIS:  Hip Fracture left  PROCEDURE:  Procedure(s): INTRAMEDULLARY (IM) NAIL INTERTROCHANTRIC (Left) and open reduction with cerclage wire  SURGEON: Leitha Schuller, MD  ASSISTANTS: None  ANESTHESIA:   general  EBL:  Total I/O In: 50 [IV Piggyback:50] Out: 200 [Urine:200]  BLOOD ADMINISTERED:none  DRAINS: none   LOCAL MEDICATIONS USED:  NONE  SPECIMEN:  No Specimen  DISPOSITION OF SPECIMEN:  N/A  COUNTS:  YES  TOURNIQUET:  * No tourniquets in log *  IMPLANTS: Biomet affixes 9 x 320 millimeter rod with 100 mm leg screw 54 mm distal interlocking screw and 1 cerclage wire Zimmer  DICTATION: .Dragon Dictation patient was brought to the operating room and after adequate general anesthesia was obtained she was transferred to the fracture table where traction was applied.  C arm was brought in and with the right leg in the well-leg holder left foot in the traction boot near anatomic alignment was obtained.  After prepping and draping using a barrier drape method appropriate patient identification and timeout procedures were completed.  Incision was made laterally to allow for placement of a clamp to reduce the subtrochanteric extent of the fracture.  Next a guidewire was inserted through the tip of the trochanter proximal reaming carried out followed by placement of the long guidewire reaming at 11 mm and placed in the 9 mm rod of the appropriate length.  The leg screw was inserted through the same hole as the cerclage wire was subsequently placed which was where the clamp had been placed.  Drilling was carried out place first placing a pin in the center center position measuring and then placing 100 mm leg screw.  The proximal type named screw was set to prevent this proximal screw from backing out and then the  insertion handle removed.  The distal interlocking screw was placed at this time.  Using the Zimmer set a guidewire was passed around the fracture site and tightened then cramped with excess were removed.  This helped maintain alignment and gave anatomic reduction of the more comminuted subtrochanteric fracture.  The wound was thoroughly irrigated deep fascia repaired using #1 Vicryl in a running manner 2-0 Vicryl subcutaneously skin staples followed by honeycomb dressing.  PLAN OF CARE: Admit to inpatient   PATIENT DISPOSITION:  PACU - hemodynamically stable.

## 2020-11-04 NOTE — Progress Notes (Signed)
CSW acknowledges consult for SNF placement. Patient will need PT/OT evals. CSW will follow based on their recommendations.  Alfonso Ramus, [place] [phone]

## 2020-11-04 NOTE — Transfer of Care (Signed)
Immediate Anesthesia Transfer of Care Note  Patient: Sandra Mcconnell  Procedure(s) Performed: INTRAMEDULLARY (IM) NAIL INTERTROCHANTRIC (Left )  Patient Location: PACU  Anesthesia Type:General  Level of Consciousness: drowsy and patient cooperative  Airway & Oxygen Therapy: Patient Spontanous Breathing and Patient connected to face mask oxygen  Post-op Assessment: Report given to RN and Post -op Vital signs reviewed and stable  Post vital signs: Reviewed and stable  Last Vitals:  Vitals Value Taken Time  BP 103/66 11/04/20 1758  Temp 36 C 11/04/20 1758  Pulse 102 11/04/20 1806  Resp 15 11/04/20 1806  SpO2 89 % 11/04/20 1806  Vitals shown include unvalidated device data.  Last Pain:  Vitals:   11/04/20 1532  TempSrc: Oral  PainSc:          Complications: No complications documented.

## 2020-11-04 NOTE — Progress Notes (Signed)
Pt had scant amount of blood to middle honeycomb dressing on arrival to pacu. At 1822 noticed to have increased bleeding, pooling of blood now under tegaderm.  Per DR Rosita Kea can be normal from coil placed there.  This RN instructed to remove current dressing and apply ABD pads with foam tape.  Old dressing was removed and new dressing placed with sterile technique.  Will continue to monitor.

## 2020-11-04 NOTE — Anesthesia Procedure Notes (Signed)
Procedure Name: Intubation Date/Time: 11/04/2020 4:12 PM Performed by: Omer Jack, CRNA Pre-anesthesia Checklist: Patient identified, Patient being monitored, Timeout performed, Emergency Drugs available and Suction available Patient Re-evaluated:Patient Re-evaluated prior to induction Oxygen Delivery Method: Circle system utilized Preoxygenation: Pre-oxygenation with 100% oxygen Induction Type: IV induction Ventilation: Mask ventilation without difficulty Laryngoscope Size: 3 and McGraph Grade View: Grade I Tube type: Oral Tube size: 7.0 mm Number of attempts: 1 Airway Equipment and Method: Stylet Placement Confirmation: ETT inserted through vocal cords under direct vision,  positive ETCO2 and breath sounds checked- equal and bilateral Secured at: 21 cm Tube secured with: Tape Dental Injury: Teeth and Oropharynx as per pre-operative assessment
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
## 2020-11-04 NOTE — Progress Notes (Signed)
Orthopedic Tech Progress Note Patient Details:  Sandra Mcconnell Dec 10, 1940 [id number] Was called this morning about patient needing a HARD C COLLAR. So I called in order to [employer] for an ASPEN CERVICAL COLLAR Patient ID: AYAKA ANDES, female   DOB: [birth year], 79 y.o.   MRN: [id number]   Donald Pore 11/04/2020, 10:16 AM

## 2020-11-04 NOTE — Anesthesia Postprocedure Evaluation (Signed)
Anesthesia Post Note  Patient: Sandra Mcconnell  Procedure(s) Performed: INTRAMEDULLARY (IM) NAIL INTERTROCHANTRIC (Left )  Patient location during evaluation: PACU Anesthesia Type: General Level of consciousness: awake and alert Pain management: pain level controlled Vital Signs Assessment: post-procedure vital signs reviewed and stable Respiratory status: spontaneous breathing and respiratory function stable Cardiovascular status: stable Anesthetic complications: no   No complications documented.   Last Vitals:  Vitals:   11/04/20 1921 11/04/20 2032  BP: (!) 105/51 (!) 111/56  Pulse: 80 81  Resp: 17 17  Temp: 36.8 C 36.9 C  SpO2: 96% 97%    Last Pain:  Vitals:   11/04/20 1901  TempSrc:   PainSc: 3                  Kyesha Balla K

## 2020-11-05 ENCOUNTER — Encounter: Payer: Self-pay | Admitting: Orthopedic Surgery

## 2020-11-05 DIAGNOSIS — S72002A Fracture of unspecified part of neck of left femur, initial encounter for closed fracture: Secondary | ICD-10-CM | POA: Diagnosis not present

## 2020-11-05 DIAGNOSIS — D649 Anemia, unspecified: Secondary | ICD-10-CM | POA: Diagnosis not present

## 2020-11-05 DIAGNOSIS — E876 Hypokalemia: Secondary | ICD-10-CM | POA: Diagnosis not present

## 2020-11-05 LAB — BASIC METABOLIC PANEL
Anion gap: 7 (ref 5–15)
BUN: 18 mg/dL (ref 8–23)
CO2: 26 mmol/L (ref 22–32)
Calcium: 7.8 mg/dL — ABNORMAL LOW (ref 8.9–10.3)
Chloride: 104 mmol/L (ref 98–111)
Creatinine, Ser: 0.83 mg/dL (ref 0.44–1.00)
GFR, Estimated: >60 mL/min (ref >60)
Glucose, Bld: 158 mg/dL — ABNORMAL HIGH (ref 70–99)
Potassium: 3.7 mmol/L (ref 3.5–5.1)
Sodium: 137 mmol/L (ref 135–145)

## 2020-11-05 LAB — PREPARE RBC (CROSSMATCH)

## 2020-11-05 LAB — CBC
HCT: 21.7 % — ABNORMAL LOW (ref 36.0–46.0)
Hemoglobin: 7.9 g/dL — ABNORMAL LOW (ref 12.0–15.0)
MCH: 33.9 pg (ref 26.0–34.0)
MCHC: 36.4 g/dL — ABNORMAL HIGH (ref 30.0–36.0)
MCV: 93.1 fL (ref 80.0–100.0)
Platelets: 111 10*3/uL — ABNORMAL LOW (ref 150–400)
RBC: 2.33 MIL/uL — ABNORMAL LOW (ref 3.87–5.11)
RDW: 13.2 % (ref 11.5–15.5)
WBC: 9.1 10*3/uL (ref 4.0–10.5)
nRBC: 0 % (ref 0.0–0.2)

## 2020-11-05 LAB — HEMOGLOBIN AND HEMATOCRIT, BLOOD
HCT: 18.1 % — ABNORMAL LOW (ref 36.0–46.0)
Hemoglobin: 6.4 g/dL — ABNORMAL LOW (ref 12.0–15.0)

## 2020-11-05 MED ORDER — OXYCODONE HCL 5 MG PO TABS: 5.0000 mg | ORAL_TABLET | ORAL | 0 refills | Status: DC | PRN

## 2020-11-05 MED ORDER — MIDODRINE HCL 5 MG PO TABS
10.0000 mg | ORAL_TABLET | Freq: Three times a day (TID) | ORAL | Status: DC
  Administered 2020-11-05 – 2020-11-12 (×21): 10 mg via ORAL
  Filled 2020-11-05 (×21): qty 2

## 2020-11-05 MED ORDER — OXYCODONE HCL ER 15 MG PO T12A
15.0000 mg | EXTENDED_RELEASE_TABLET | Freq: Two times a day (BID) | ORAL | Status: DC
  Administered 2020-11-05 – 2020-11-08 (×8): 15 mg via ORAL
  Filled 2020-11-05 (×11): qty 1

## 2020-11-05 MED ORDER — SODIUM CHLORIDE 0.9% IV SOLUTION: Freq: Once | INTRAVENOUS | Status: DC

## 2020-11-05 MED ORDER — FE FUMARATE-B12-VIT C-FA-IFC PO CAPS
1.0000 | ORAL_CAPSULE | Freq: Two times a day (BID) | ORAL | Status: DC
  Administered 2020-11-05 – 2020-11-12 (×13): 1 via ORAL
  Filled 2020-11-05 (×16): qty 1

## 2020-11-05 MED ORDER — SODIUM CHLORIDE 0.9 % IV BOLUS
1000.0000 mL | Freq: Once | INTRAVENOUS | Status: AC
  Administered 2020-11-05: 10:00:00 1000 mL via INTRAVENOUS

## 2020-11-05 MED ORDER — ACETAMINOPHEN 325 MG PO TABS
650.0000 mg | ORAL_TABLET | Freq: Four times a day (QID) | ORAL | Status: DC | PRN
  Administered 2020-11-05 – 2020-11-10 (×6): 650 mg via ORAL
  Filled 2020-11-05 (×6): qty 2

## 2020-11-05 NOTE — Progress Notes (Signed)
Subjective: 1 Day Post-Op Procedure(s) (LRB): INTRAMEDULLARY (IM) NAIL INTERTROCHANTRIC (Left) Patient reports pain as moderate.   Patient is well, and has had no acute complaints or problems. Denies any CP, SOB, ABD pain. We will continue therapy today.  Plan is to go [facility] at discharge  Objective: Vital signs in last 24 hours: Temp:  [96.8 F (36 C)-101 F (38.3 C)] 98.2 F (36.8 C) (12/30 0801) Pulse Rate:  [71-103] 82 (12/30 0801) Resp:  [13-18] 16 (12/30 0801) BP: (80-126)/(41-70) 90/43 (12/30 0801) SpO2:  [76 %-100 %] 76 % (12/30 0801) Weight:  [63.8 kg] 63.8 kg (12/29 0944)  Intake/Output from previous day: 12/29 0701 - 12/30 0700 In: 650 [I.V.:600; IV Piggyback:50] Out: 525 [Urine:500; Blood:25] Intake/Output this shift: No intake/output data recorded.  Recent Labs    11/03/20 1059 11/03/20 1428 11/04/20 0505 11/05/20 0416  HGB 10.5* 9.4* 9.9* 7.9*   Recent Labs    11/04/20 0505 11/05/20 0416  WBC 10.1 9.1  RBC 2.95* 2.33*  HCT 27.4* 21.7*  PLT 154 111*   Recent Labs    11/04/20 0505 11/05/20 0416  NA 139 137  K 3.4* 3.7  CL 102 104  CO2 29 26  BUN 17 18  CREATININE 0.70 0.83  GLUCOSE 143* 158*  CALCIUM 8.1* 7.8*   Recent Labs    11/03/20 1142  INR 0.9    EXAM General - Patient is Alert, Appropriate and Oriented Extremity - Neurovascular intact Sensation intact distally Intact pulses distally Dorsiflexion/Plantar flexion intact No cellulitis present Compartment soft Dressing - dressing C/D/I and no drainage Motor Function - intact, moving foot and toes well on exam.   Past Medical History:  Diagnosis Date  . Anemia   . Anemia 08/04/2017  . Anemia 08/04/2017  . Anxiety   . Carpal tunnel syndrome   . Carpal tunnel syndrome   . Cataract cortical, senile   . Cervical spinal cord compression (HCC)   . Chicken pox   . Colitis   . Complication of anesthesia   . Depression   . Dizziness   . DJD (degenerative joint  disease)   . Dysphagia   . GERD (gastroesophageal reflux disease)   . Hemorrhoids   . History of hiatal hernia   . History of palpitations    EMOTIONAL PALPITATIONS  . IBS (irritable bowel syndrome)   . Iritis   . Migraines   . Neuropathy   . OCD (obsessive compulsive disorder)   . Osteoarthritis   . Osteoporosis   . Panic attacks   . PONV (postoperative nausea and vomiting)   . Psoriasis   . Psoriasis   . Redundant colon   . REM sleep behavior disorder   . Sleep apnea   . Sleep disorder    REM SLEEP DISORDER  . Slow transit constipation   . Spondylosis    CERVICAL,SLEEPS WITH HOB ELEVATED AND WEARS NECK SUPPORT  . Spondylosis of cervical joint   . Spondylosis of cervical joint     Assessment/Plan:   1 Day Post-Op Procedure(s) (LRB): INTRAMEDULLARY (IM) NAIL INTERTROCHANTRIC (Left) Active Problems:   Hip fracture (HCC)  Estimated body mass index is 29.4 kg/m as calculated from the following:   Height as of 09/15/20: 4\' 10"  (1.473 m).   Weight as of this encounter: 63.8 kg. Advance diet Up with therapy  Work on BM Hypotension - monitor narcotics. Continue with iv fluids Acute post op blood loss anemia - Hgb 7.9, start Iron supplement CM to assist  with discharge to [facility]  DVT Prophylaxis - Foot Pumps, TED hose and Heparin Weight-Bearing as tolerated to left leg   T. Rachelle Hora, PA-C [place] 11/05/2020, 8:16 AM

## 2020-11-05 NOTE — NC FL2 (Signed)
[place] LEVEL OF CARE SCREENING TOOL     IDENTIFICATION  Patient Name: Sandra Mcconnell Birthdate: Jul 26, 1941 Sex: female Admission Date (Current Location): 11/03/2020  [place] and [place] Number:  Engineering geologist and Address:  [facility], [address] [place], [place] [address]      Provider Number: [id number]  Attending Physician Name and Address:  Wyvonnia Dusky, MD  Relative Name and Phone Number:  Yaileen Nachbar [phone]    Current Level of Care: Hospital Recommended Level of Care: [place] Prior Approval Number:    Date Approved/Denied:   PASRR Number: [id number]  Discharge Plan: SNF    Current Diagnoses: Patient Active Problem List   Diagnosis Date Noted  . Hip fracture ([place]) 11/03/2020  . Lactose intolerance 11/30/2017  . Slow transit constipation 08/04/2017  . 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 Restless leg syndrome 02/20/2015  . OA (osteoarthritis) 07/25/2014  . Anxiety 05/14/2014  . Depression 05/14/2014  . GERD (gastroesophageal reflux disease) 05/14/2014  . 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 Headache 03/14/2014  . Other and unspecified hyperlipidemia 03/14/2014  . 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 Urinary system disease 06/28/2013  . 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 FOM (frequency of micturition) 06/28/2013  . Cervical spinal cord compression ([place]) 11/28/2012  . Sleep apnea 06/18/2012    Orientation RESPIRATION BLADDER Height & Weight     Self,Time,Situation,Place  Normal External catheter Weight: 63.8 kg Height:     BEHAVIORAL SYMPTOMS/MOOD NEUROLOGICAL BOWEL NUTRITION STATUS      Continent Diet (Regular)  AMBULATORY STATUS COMMUNICATION OF NEEDS Skin   Extensive Assist Verbally Surgical wounds                       Personal Care Assistance Level of Assistance  Bathing,Feeding,Dressing  Bathing Assistance: Maximum assistance Feeding assistance: Limited assistance Dressing Assistance: Maximum assistance     Functional Limitations Info             SPECIAL CARE FACTORS FREQUENCY  PT (By licensed PT),OT (By licensed OT)                    Contractures Contractures Info: Not present    Additional Factors Info  Code Status,Allergies Code Status Info: Full Allergies Info: Aripiprazole, Ciprofloxacin, Mirtazapine, Penicillins           Current Medications (11/05/2020):  This is the current hospital active medication list Current Facility-Administered Medications  Medication Dose Route Frequency Provider Last Rate Last Admin  . 0.9 %  sodium chloride infusion (Manually program via Guardrails IV Fluids)   Intravenous Once Wyvonnia Dusky, MD      . 0.9 %  sodium chloride infusion   Intravenous Continuous Hessie Knows, MD 100 mL/hr at 11/04/20 2200 New Bag at 11/04/20 2200  . acetaminophen (TYLENOL) tablet 650 mg  650 mg Oral Q6H PRN Wyvonnia Dusky, MD   650 mg at 11/05/20 1452  . acidophilus (RISAQUAD) capsule 1 capsule  1 capsule Oral Q1500 Clance Boll, MD   1 capsule at 11/05/20 1443  . bisacodyl (DULCOLAX) suppository 10 mg  10 mg Rectal Daily PRN Hessie Knows, MD      . Derrill Memo ON 11/09/2020] buprenorphine (BUTRANS) 7.5 MCG/HR 1 patch  1 patch Transdermal Q Dyane Dustman, Legrand Como, MD      . ceFAZolin (ANCEF) IVPB 2g/100 mL premix  2 g Intravenous  Once Hessie Knows, MD      . Chlorhexidine Gluconate Cloth 2 % PADS 6 each  6 each Topical Q0600 Hessie Knows, MD      . feeding supplement (ENSURE ENLIVE / ENSURE PLUS) liquid 237 mL  237 mL Oral BID BM Hessie Knows, MD      . ferrous [id number] C-folic acid (TRINSICON / FOLTRIN) capsule 1 capsule  1 capsule Oral BID Duanne Guess, PA-C   1 capsule at 11/05/20 0932  . gabapentin (NEURONTIN) capsule 800 mg  800 mg Oral TID Hessie Knows, MD   800 mg at 11/05/20 0931  . heparin injection  5,000 Units  5,000 Units Subcutaneous Q8H Hessie Knows, MD   5,000 Units at 11/05/20 1443  . HYDROmorphone (DILAUDID) injection 1 mg  1 mg Intravenous Q4H PRN Hessie Knows, MD   1 mg at 11/05/20 0028  . magnesium citrate solution 1 Bottle  1 Bottle Oral Once PRN Hessie Knows, MD      . magnesium hydroxide (MILK OF MAGNESIA) suspension 30 mL  30 mL Oral Daily PRN Hessie Knows, MD      . methocarbamol (ROBAXIN) tablet 500 mg  500 mg Oral Q6H PRN Hessie Knows, MD   500 mg at 11/05/20 1218   Or  . methocarbamol (ROBAXIN) 500 mg in dextrose 5 % 50 mL IVPB  500 mg Intravenous Q6H PRN Hessie Knows, MD      . metoCLOPramide (REGLAN) tablet 5-10 mg  5-10 mg Oral Q8H PRN Hessie Knows, MD       Or  . metoCLOPramide (REGLAN) injection 5-10 mg  5-10 mg Intravenous Q8H PRN Hessie Knows, MD      . midodrine (PROAMATINE) tablet 10 mg  10 mg Oral TID Wyvonnia Dusky, MD   10 mg at 11/05/20 1218  . multivitamin with minerals tablet 1 tablet  1 tablet Oral Daily Hessie Knows, MD   1 tablet at 11/05/20 0932  . ondansetron (ZOFRAN) tablet 4 mg  4 mg Oral Q6H PRN Hessie Knows, MD       Or  . ondansetron [facility]) injection 4 mg  4 mg Intravenous Q6H PRN Hessie Knows, MD      . oxyCODONE (Oxy IR/ROXICODONE) immediate release tablet 10 mg  10 mg Oral Q3H PRN Hessie Knows, MD   10 mg at 11/05/20 1040  . oxyCODONE (OXYCONTIN) 12 hr tablet 15 mg  15 mg Oral Q12H Hessie Knows, MD   15 mg at 11/05/20 1452  . pantoprazole (PROTONIX) EC tablet 40 mg  40 mg Oral Daily Myles Rosenthal A, MD   40 mg at 11/05/20 0932  . potassium chloride SA (KLOR-CON) CR tablet 20 mEq  20 mEq Oral Once Hessie Knows, MD      . prazosin (MINIPRESS) capsule 2 mg  2 mg Oral TID Clance Boll, MD   2 mg at 11/05/20 0932  . QUEtiapine (SEROQUEL) tablet 25 mg  25 mg Oral QHS Myles Rosenthal A, MD   25 mg at 11/04/20 2148  . rOPINIRole (REQUIP) tablet 1 mg  1 mg Oral QHS Myles Rosenthal A, MD   1 mg at 11/04/20 2147  .  senna (SENOKOT) tablet 8.6 mg  1 tablet Oral BID Hessie Knows, MD   8.6 mg at 11/05/20 0932  . senna-docusate (Senokot-S) tablet 1 tablet  1 tablet Oral QHS PRN Hessie Knows, MD      . sertraline (ZOLOFT) tablet 100 mg  100 mg Oral BID Myles Rosenthal A,  MD   100 mg at 11/05/20 0931  . tiZANidine (ZANAFLEX) tablet 1 mg  1 mg Oral QID Skip Mayer A, MD   1 mg at 11/05/20 1443  . zolpidem (AMBIEN) tablet 5 mg  5 mg Oral QHS PRN Kennedy Bucker, MD         Discharge Medications: Please see discharge summary for a list of discharge medications.  Relevant Imaging Results:  Relevant Lab Results:   Additional Information SS# [id number]  Trenton Founds, RN

## 2020-11-05 NOTE — Progress Notes (Signed)
Informed consent for 1 unit of RBC collected and placed in chart.

## 2020-11-05 NOTE — Progress Notes (Signed)
Notified Dr. Mayford Knife patient continues to be in pain, however her BP is still low after midodrine,

## 2020-11-05 NOTE — Progress Notes (Signed)
   11/05/20 1700  Document  Patient Outcome Stabilized after interventions;Other (Comment) (New order for blood 1 unit. Held Prazosin)  Progress note created (see row info) Yes

## 2020-11-05 NOTE — Progress Notes (Signed)
   11/05/20 1141  Vitals  Temp 98.3 F (36.8 C)  BP (!) 86/45  MAP (mmHg) (!) 57  BP Location Right Leg  BP Method Automatic  Patient Position (if appropriate) Lying  Pulse Rate 89  Pulse Rate Source Monitor  Resp 18  MEWS COLOR  MEWS Score Color Green  Oxygen Therapy  SpO2 95 %  O2 Device Room Air  MEWS Score  MEWS Temp 0  MEWS Systolic 1  MEWS Pulse 0  MEWS RR 0  MEWS LOC 0  MEWS Score 1

## 2020-11-05 NOTE — Progress Notes (Signed)
PT Cancellation Note  Patient Details Name: Sandra Mcconnell MRN: [id number] DOB: [birth year]   Cancelled Treatment:    Reason Eval/Treat Not Completed:  (Consult received and chart reviewed. Per discussion with primary RN, patient generally hypotensive (receiving bolus) and limited in meds available for pain as result.  Advises deferring PT evaluation until bolus complete and able to receive meds for pain-control.  Patient, husband in agreement with plan.  Will continue efforts at later time this date as medically appropriate.)   [name] H. Manson Passey, PT, DPT, NCS 11/05/20, 10:01 AM [phone]

## 2020-11-05 NOTE — Progress Notes (Signed)
Patient bladder scan revealed 140cc. Will continue to monitor

## 2020-11-05 NOTE — Plan of Care (Signed)
  Problem: Education: Goal: Knowledge of General Education information will improve Description Including pain rating scale, medication(s)/side effects and non-pharmacologic comfort measures Outcome: Progressing   Problem: Health Behavior/Discharge Planning: Goal: Ability to manage health-related needs will improve Outcome: Progressing   Problem: Clinical Measurements: Goal: Ability to maintain clinical measurements within normal limits will improve Outcome: Progressing Goal: Will remain free from infection Outcome: Progressing Goal: Diagnostic test results will improve Outcome: Progressing Goal: Respiratory complications will improve Outcome: Progressing Goal: Cardiovascular complication will be avoided Outcome: Progressing   Problem: Activity: Goal: Risk for activity intolerance will decrease Outcome: Progressing   Problem: Nutrition: Goal: Adequate nutrition will be maintained Outcome: Progressing   Problem: Coping: Goal: Level of anxiety will decrease Outcome: Progressing   Problem: Elimination: Goal: Will not experience complications related to bowel motility Outcome: Progressing Goal: Will not experience complications related to urinary retention Outcome: Progressing   Problem: Pain Managment: Goal: General experience of comfort will improve Outcome: Progressing   Problem: Safety: Goal: Ability to remain free from injury will improve Outcome: Progressing   Problem: Skin Integrity: Goal: Risk for impaired skin integrity will decrease Outcome: Progressing   Problem: Education: Goal: Verbalization of understanding the information provided (i.e., activity precautions, restrictions, etc) will improve Outcome: Progressing Goal: Individualized Educational Video(s) Outcome: Progressing   Problem: Activity: Goal: Ability to ambulate and perform ADLs will improve Outcome: Progressing   Problem: Clinical Measurements: Goal: Postoperative complications will be  avoided or minimized Outcome: Progressing   

## 2020-11-05 NOTE — Progress Notes (Signed)
Patients VS Bp was 80/41 at 0500. Decided to escalate to Dr. Mayford Knife    11/05/20 0700  Take Vital Signs  Increase Vital Sign Frequency  Yellow: Q 2hr X 2 then Q 4hr X 2, if remains yellow, continue Q 4hrs  Escalate  MEWS: Escalate Yellow: discuss with charge nurse/RN and consider discussing with provider and RRT  Notify: Charge Nurse/RN  Name of Charge Nurse/RN Notified Marchelle Folks, RN  Date Charge Nurse/RN Notified 11/05/20  Time Charge Nurse/RN Notified 0740  Notify: Provider  Provider Name/Title Dr. Mayford Knife  Date Provider Notified 11/05/20  Time Provider Notified 0740  Notification Type Page  Notification Reason Change in status
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
## 2020-11-05 NOTE — Progress Notes (Signed)
Patients BP  Continues to be low , 86/45 New order for Midodrine given and Robaxin , Will reassess BP

## 2020-11-05 NOTE — Progress Notes (Signed)
PT Cancellation Note  Patient Details Name: Sandra Mcconnell MRN: [id number] DOB: Mar 26, 1941   Cancelled Treatment:    Reason Eval/Treat Not Completed: Medical issues which prohibited therapy (Patient continues with persistently low BP despite bolus and medications to address; limited pain control as result.  Per RN, advises continued hold until addressed.  Will re-attempt next date as medically appropriate.)   Kaleeyah Cuffie H. Manson Passey, PT, DPT, NCS 11/05/20, 2:24 PM [phone]

## 2020-11-06 DIAGNOSIS — D649 Anemia, unspecified: Secondary | ICD-10-CM | POA: Diagnosis not present

## 2020-11-06 DIAGNOSIS — S72002A Fracture of unspecified part of neck of left femur, initial encounter for closed fracture: Secondary | ICD-10-CM | POA: Diagnosis not present

## 2020-11-06 DIAGNOSIS — E876 Hypokalemia: Secondary | ICD-10-CM | POA: Diagnosis not present

## 2020-11-06 LAB — CBC
HCT: 22.8 % — ABNORMAL LOW (ref 36.0–46.0)
Hemoglobin: 8.3 g/dL — ABNORMAL LOW (ref 12.0–15.0)
MCH: 32.7 pg (ref 26.0–34.0)
MCHC: 36.4 g/dL — ABNORMAL HIGH (ref 30.0–36.0)
MCV: 89.8 fL (ref 80.0–100.0)
Platelets: 123 10*3/uL — ABNORMAL LOW (ref 150–400)
RBC: 2.54 MIL/uL — ABNORMAL LOW (ref 3.87–5.11)
RDW: 13.4 % (ref 11.5–15.5)
WBC: 9.9 10*3/uL (ref 4.0–10.5)
nRBC: 0 % (ref 0.0–0.2)

## 2020-11-06 LAB — BASIC METABOLIC PANEL
Anion gap: 7 (ref 5–15)
BUN: 12 mg/dL (ref 8–23)
CO2: 25 mmol/L (ref 22–32)
Calcium: 7.5 mg/dL — ABNORMAL LOW (ref 8.9–10.3)
Chloride: 105 mmol/L (ref 98–111)
Creatinine, Ser: 0.57 mg/dL (ref 0.44–1.00)
GFR, Estimated: >60 mL/min (ref >60)
Glucose, Bld: 131 mg/dL — ABNORMAL HIGH (ref 70–99)
Potassium: 3.1 mmol/L — ABNORMAL LOW (ref 3.5–5.1)
Sodium: 137 mmol/L (ref 135–145)

## 2020-11-06 LAB — HEMOGLOBIN AND HEMATOCRIT, BLOOD
HCT: 22.4 % — ABNORMAL LOW (ref 36.0–46.0)
Hemoglobin: 7.9 g/dL — ABNORMAL LOW (ref 12.0–15.0)

## 2020-11-06 LAB — TROPONIN I (HIGH SENSITIVITY): Troponin I (High Sensitivity): 40 ng/L — ABNORMAL HIGH (ref <18)

## 2020-11-06 MED ORDER — POTASSIUM CHLORIDE CRYS ER 20 MEQ PO TBCR
40.0000 meq | EXTENDED_RELEASE_TABLET | Freq: Once | ORAL | Status: AC
  Administered 2020-11-06: 40 meq via ORAL
  Filled 2020-11-06: qty 2

## 2020-11-06 MED ORDER — ALUM & MAG HYDROXIDE-SIMETH 200-200-20 MG/5ML PO SUSP
30.0000 mL | Freq: Four times a day (QID) | ORAL | Status: DC | PRN
  Filled 2020-11-06: qty 30

## 2020-11-06 MED ORDER — POTASSIUM CHLORIDE 20 MEQ PO PACK: 40.0000 meq | PACK | Freq: Once | ORAL | Status: DC

## 2020-11-06 MED ORDER — TIZANIDINE HCL 2 MG PO TABS
2.0000 mg | ORAL_TABLET | Freq: Four times a day (QID) | ORAL | Status: DC
  Administered 2020-11-06 – 2020-11-07 (×5): 2 mg via ORAL
  Filled 2020-11-06 (×8): qty 1

## 2020-11-06 MED ORDER — ENOXAPARIN SODIUM 40 MG/0.4ML ~~LOC~~ SOLN
40.0000 mg | SUBCUTANEOUS | Status: DC
  Administered 2020-11-06 – 2020-11-12 (×6): 40 mg via SUBCUTANEOUS
  Filled 2020-11-06 (×6): qty 0.4

## 2020-11-06 NOTE — Evaluation (Signed)
Physical Therapy Evaluation Patient Details Name: Sandra Mcconnell MRN: [id number] DOB: 24-Nov-1940 Today's Date: 11/06/2020   History of Present Illness  presented to ER secondary to mechanical fall in home environment; admitted for management of reverse obliquity L proximal femur fracture, s/p ORIF (11/04/20), WBAT.  Orders noted for cervical collar with OOB activities (utilized for comfort due to chronic neck pain/weakness per patient/husband report).  Clinical Impression  Patient resting in bed upon arrival to room; daughter visiting at bedside.  Generally fatigued, often closing eyes if not consistently engaged; however, agreeable to participation with session as tolerated.  Oriented to basic information, follows commands and demonstrates fair/good effort with mobility tasks.  Continues with significant pain to L hip; generally very guarded in all movement planes; limited ability to isolate/activate individual muscles for supine therex (requring act assist for all movement).  Currently requiring max assist +2 for bed mobility; max progressing to min assist for unsupported sitting balance.  Maintains heavy R lateral lean to offset L hip WBing and requires bilat UE support on bed with all sitting postures/activities.  Unable to move outside immediate posture/BOS due to pain, poor tolerance for L hip flexion and associated balance deficits. Unable to tolerate OOB or standing attempts this date due to pain; patient requesting return to bed after sitting activities.  Will continue to assess/progress as medically appropriate; anticipate need for scoot pivot technique for initial transfers due to pain/generalized weakness. Will need c-collar for additional OOB activities per orders. Would benefit from skilled PT to address above deficits and promote optimal return to PLOF.; recommend transition to STR upon discharge from acute hospitalization.     Follow Up Recommendations SNF    Equipment  Recommendations       Recommendations for Other Services       Precautions / Restrictions Precautions Precautions: Fall Required Braces or Orthoses: Spinal Brace Spinal Brace: Applied in sitting position Restrictions LLE Weight Bearing: Weight bearing as tolerated      Mobility  Bed Mobility Overal bed mobility: Needs Assistance Bed Mobility: Supine to Sit;Sit to Supine     Supine to sit: Max assist;+2 for physical assistance Sit to supine: Max assist;+2 for physical assistance   General bed mobility comments: very guarded, limited ability to dissociate extremities from trunk; limited ability to actively assist with transfer due to fear/guarding    Transfers                 General transfer comment: unsafe/unable  Ambulation/Gait             General Gait Details: unsafe/unable  Stairs            Wheelchair Mobility    Modified Rankin (Stroke Patients Only)       Balance Overall balance assessment: Needs assistance Sitting-balance support: No upper extremity supported;Feet supported Sitting balance-Leahy Scale: Poor Sitting balance - Comments: heavy posterior lean initially, max assist to maintain upright; weight shift to R to offset L hip WBing.  Improves to min assist for balance with relaxation and acommodation to position; unable to remove UE support from position, however.       Standing balance comment: unsafe/unable                             Pertinent Vitals/Pain Pain Assessment: Faces Faces Pain Scale: Hurts whole lot Pain Location: L hip Pain Descriptors / Indicators: Aching;Grimacing;Guarding Pain Intervention(s): Limited activity within patient's tolerance;Monitored during session;Premedicated before  session;Repositioned    Home Living Family/patient expects to be discharged to:: Private residence Living Arrangements: Spouse/significant other Available Help at Discharge: Family Type of Home: House Home Access:  Level entry     Home Layout: One [place]: Environmental consultant - 4 wheels Additional Comments: Resident of [place]    Prior Function Level of Independence: Independent with assistive device(s)         Comments: Mod indep with 4WRW for ADLs, household mobilization     Hand Dominance        Extremity/Trunk Assessment   Upper Extremity Assessment Upper Extremity Assessment: Overall WFL for tasks assessed    Lower Extremity Assessment Lower Extremity Assessment: Generalized weakness (L hip grossly 2+ to 3-/5 throughout hip and knee, generally limited by pain; ankle grossly 3+/5.  R LE grossly 4-/5.)    Cervical / Trunk Assessment Cervical / Trunk Assessment: Kyphotic  Communication   Communication: No difficulties  Cognition Arousal/Alertness: Awake/alert Behavior During Therapy: WFL for tasks assessed/performed Overall Cognitive Status: Within Functional Limits for tasks assessed                                 General Comments: closes eyes frequently during session if not consistently engaged      General Comments      Exercises Other Exercises Other Exercises: Supine L LE therex, 1x10, act assist ROM: ankle pumps, quad sets, SAQs, heel slides, hip abduct/adduct.  Very guarded movement, very limited range tolerated (due to pain) Other Exercises: Orthostatic assessment; BP stable and WFL with transition from supine to sit.  See vitals flowsheet for details. Other Exercises: Reviewed role of PT, progressive mobility and goals during acute hospitalization; patient voiced understanding of all information.   Assessment/Plan    PT Assessment Patient needs continued PT services  PT Problem List Decreased strength;Decreased range of motion;Decreased activity tolerance;Decreased balance;Decreased mobility;Decreased coordination;Decreased knowledge of use of DME;Decreased safety awareness;Decreased knowledge of precautions;Cardiopulmonary status  limiting activity;Pain       PT Treatment Interventions DME instruction;Gait training;Functional mobility training;Therapeutic activities;Therapeutic exercise;Balance training;Cognitive remediation;Patient/family education    PT Goals (Current goals can be found in the Care Plan section)  Acute Rehab PT Goals Patient Stated Goal: to do the best i can PT Goal Formulation: With patient/family Time For Goal Achievement: 11/20/20 Potential to Achieve Goals: Fair    Frequency 7X/week   Barriers to discharge Decreased caregiver support      Co-evaluation               AM-PAC PT "6 Clicks" Mobility  Outcome Measure Help needed turning from your back to your side while in a flat bed without using bedrails?: Total Help needed moving from lying on your back to sitting on the side of a flat bed without using bedrails?: Total Help needed moving to and from a bed to a chair (including a wheelchair)?: Total Help needed standing up from a chair using your arms (e.g., wheelchair or bedside chair)?: Total Help needed to walk in hospital room?: Total Help needed climbing 3-5 steps with a railing? : Total 6 Click Score: 6    End of Session   Activity Tolerance: Patient limited by pain Patient left: in bed;with call bell/phone within reach;with bed alarm set Nurse Communication: Mobility status PT Visit Diagnosis: Muscle weakness (generalized) (M62.81);Difficulty in walking, not elsewhere classified (R26.2)    Time: [id number] PT Time Calculation (min) (ACUTE ONLY): 37  min   Charges:   PT Evaluation $PT Eval Moderate Complexity: 1 Mod PT Treatments $Therapeutic Exercise: 8-22 mins $Therapeutic Activity: 8-22 mins        Rafal Archuleta H. Owens Shark, PT, DPT, NCS 11/06/20, 3:32 PM [phone]

## 2020-11-06 NOTE — Progress Notes (Signed)
PROGRESS NOTE    Sandra Mcconnell  [id number] DOB: 1941-01-23 DOA: 11/03/2020 PCP: Juluis Pitch, MD   Assessment & Plan:   Active Problems:   Hip fracture (HCC)   Left hip fracture: s/p mechanical fall. S/p intramedullary nail intertrochanteric & open reduction on 11/04/20 as per ortho surg. Oxycodone, dilaudid prn for pain. PT/OT consulted  Hypokalemia: KCl repleted. Will continue to monitor   Thrombocytopenia: etiology unclear. 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 Restart home dose of aspirin when ok with ortho surg   Depression/PTSD: severity unknown. 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 Continue on home dose of gabapentin, tizandidine  GERD: continue on PPI   Normocytic anemia: w/ acute post op anemia. S/p 1 unit of pRBCs transfused. H&H are trending up today. Will continue to monitor  DVT prophylaxis: heparin Code Status: full  Family Communication: discussed pt's care w/ pt's family at bedside and answered their questions  Disposition Plan: likely d/c to SNF   Status is: Inpatient  Remains inpatient appropriate because:Ongoing diagnostic testing needed not appropriate for outpatient work up, Unsafe d/c plan and IV treatments appropriate due to intensity of illness or inability to take PO   Dispo: The patient is from: Home              Anticipated d/c is to: SNF              Anticipated d/c date is: 3 days              Patient currently is not medically stable to d/c.        Consultants:   Ortho surg    Procedures:    Antimicrobials:   Subjective: Pt c/o chronic pain   Objective: Vitals:   11/05/20 2250 11/06/20 0039 11/06/20 0408 11/06/20 0433  BP: (!) 111/58 (!) 100/52 (!) 108/54 (!) 107/56  Pulse: 96 91 92 92  Resp: 18  14   Temp:  99.8 F (37.7 C)  99 F (37.2 C)   TempSrc: Oral     SpO2: 92%  95% 93%  Weight:        Intake/Output Summary (Last 24 hours) at 11/06/2020 0724 Last data filed at 11/06/2020 0435 Gross per 24 hour  Intake 2057.11 ml  Output 500 ml  Net 1557.11 ml   Filed Weights   11/04/20 0944  Weight: 63.8 kg    Examination:  General exam: Appears calm but uncomfortable  Respiratory system: clear breath sounds b/l.  Cardiovascular system: S1 & S2+. No clicks or rubs Gastrointestinal system: Abd is soft, nontender, nondistended & hypoactive bowel sounds Central nervous system: Alert and oriented. Moves all 4 extremities  Psychiatry: Judgement and insight appears normal. Flat mood and affect     Data Reviewed: I have personally reviewed following labs and imaging studies  CBC: Recent Labs  Lab 11/03/20 1059 11/03/20 1428 11/04/20 0505 11/05/20 0416 11/05/20 1432 11/06/20 0024 11/06/20 0504  WBC 5.2 12.6* 10.1 9.1  --   --  9.9  HGB 10.5* 9.4* 9.9* 7.9* 6.4* 7.9* 8.3*  HCT 28.8* 26.3* 27.4* 21.7* 18.1* 22.4* 22.8*  MCV 91.7 92.3 92.9 93.1  --   --  89.8  PLT 158 151 154 111*  --   --  [id number]   Basic Metabolic  Panel: Recent Labs  Lab 11/03/20 1059 11/03/20 1428 11/04/20 0505 11/05/20 0416 11/06/20 0504  NA 138  --  139 137 137  K 4.0  --  3.4* 3.7 3.1*  CL 105  --  102 104 105  CO2 27  --  29 26 25   GLUCOSE 89  --  143* 158* 131*  BUN 16  --  17 18 12   CREATININE 0.72 0.58 0.70 0.83 0.57  CALCIUM 8.6*  --  8.1* 7.8* 7.5*   GFR: Estimated Creatinine Clearance: 43.6 mL/min (by C-G formula based on SCr of 0.57 mg/dL). Liver Function Tests: No results for input(s): AST, ALT, ALKPHOS, BILITOT, PROT, ALBUMIN in the last 168 hours. No results for input(s): LIPASE, AMYLASE in the last 168 hours. No results for input(s): AMMONIA in the last 168 hours. Coagulation Profile: Recent Labs  Lab 11/03/20 1142  INR 0.9   Cardiac Enzymes: No results for input(s): CKTOTAL,  CKMB, CKMBINDEX, TROPONINI in the last 168 hours. BNP (last 3 results) No results for input(s): PROBNP in the last 8760 hours. HbA1C: No results for input(s): HGBA1C in the last 72 hours. CBG: No results for input(s): GLUCAP in the last 168 hours. Lipid Profile: No results for input(s): CHOL, HDL, LDLCALC, TRIG, CHOLHDL, LDLDIRECT in the last 72 hours. Thyroid Function Tests: No results for input(s): TSH, T4TOTAL, FREET4, T3FREE, THYROIDAB in the last 72 hours. Anemia Panel: No results for input(s): VITAMINB12, FOLATE, FERRITIN, TIBC, IRON, RETICCTPCT in the last 72 hours. Sepsis Labs: No results for input(s): PROCALCITON, LATICACIDVEN in the last 168 hours.  Recent Results (from the past 240 hour(s))  Resp Panel by RT-PCR (Flu A&B, Covid) Nasopharyngeal Swab     Status: None   Collection Time: 11/03/20  2:43 PM   Specimen: Nasopharyngeal Swab; Nasopharyngeal(NP) swabs in vial transport medium  Result Value Ref Range Status   SARS Coronavirus 2 by RT PCR NEGATIVE NEGATIVE Final    Comment: (NOTE) SARS-CoV-2 target nucleic acids are NOT DETECTED.  The SARS-CoV-2 RNA is generally detectable in upper respiratory specimens during the acute phase of infection. The lowest concentration of SARS-CoV-2 viral copies this assay can detect is 138 copies/mL. A negative result does not preclude SARS-Cov-2 infection and should not be used as the sole basis for treatment or other patient management decisions. A negative result may occur with  improper specimen collection/handling, submission of specimen other than nasopharyngeal swab, presence of viral mutation(s) within the areas targeted by this assay, and inadequate number of viral copies(<138 copies/mL). A negative result must be combined with clinical observations, patient history, and epidemiological information. The expected result is Negative.  Fact Sheet for Patients:  EntrepreneurPulse.com.au  Fact Sheet for  Healthcare Providers:  IncredibleEmployment.be  This test is no t yet approved or cleared by the [place] FDA and  has been authorized for detection and/or diagnosis of SARS-CoV-2 by FDA under an Emergency Use Authorization (EUA). This EUA will remain  in effect (meaning this test can be used) for the duration of the COVID-19 declaration under Section 564(b)(1) of the Act, 21 U.S.C.section 360bbb-3(b)(1), unless the authorization is terminated  or revoked sooner.       Influenza A by PCR NEGATIVE NEGATIVE Final   Influenza B by PCR NEGATIVE NEGATIVE Final    Comment: (NOTE) The Xpert Xpress SARS-CoV-2/FLU/RSV plus assay is intended as an aid in the diagnosis of influenza from Nasopharyngeal swab specimens and should not be used as a sole basis for treatment. Nasal washings and  aspirates are unacceptable for Xpert Xpress SARS-CoV-2/FLU/RSV testing.  Fact Sheet for Patients: BloggerCourse.com  Fact Sheet for Healthcare Providers: SeriousBroker.it  This test is not yet approved or cleared by the [place] FDA and has been authorized for detection and/or diagnosis of SARS-CoV-2 by FDA under an Emergency Use Authorization (EUA). This EUA will remain in effect (meaning this test can be used) for the duration of the COVID-19 declaration under Section 564(b)(1) of the Act, 21 U.S.C. section 360bbb-3(b)(1), unless the authorization is terminated or revoked.  Performed at [facility], [address], [place], [place] [address]          Radiology Studies: DG HIP OPERATIVE UNILAT W OR W/O PELVIS LEFT  Result Date: 11/04/2020 CLINICAL DATA:  Intramedullary rod fixation of left femur. EXAM: OPERATIVE left HIP (WITH PELVIS IF PERFORMED) 7 VIEWS TECHNIQUE: Fluoroscopic spot image(s) were submitted for interpretation post-operatively. Radiation exposure index: 16.4 mGy. COMPARISON:  None. FINDINGS:  Seven intraoperative fluoroscopic images were obtained of the left hip. These images demonstrate intramedullary rod fixation of proximal left femoral fracture. Good alignment of fracture components is noted. IMPRESSION: Status post intramedullary rod fixation of left femur. Electronically Signed   By: Lupita Raider M.D.   On: 11/04/2020 18:01        Scheduled Meds: . sodium chloride   Intravenous Once  . acidophilus  1 capsule Oral Q1500  . [START ON 11/09/2020] buprenorphine  1 patch Transdermal Q Mon  . Chlorhexidine Gluconate Cloth  6 each Topical Q0600  . feeding supplement  237 mL Oral BID BM  . ferrous fumarate-b12-vitamic C-folic acid  1 capsule Oral BID  . gabapentin  800 mg Oral TID  . heparin  5,000 Units Subcutaneous Q8H  . midodrine  10 mg Oral TID  . multivitamin with minerals  1 tablet Oral Daily  . oxyCODONE  15 mg Oral Q12H  . pantoprazole  40 mg Oral Daily  . potassium chloride  20 mEq Oral Once  . prazosin  2 mg Oral TID  . QUEtiapine  25 mg Oral QHS  . rOPINIRole  1 mg Oral QHS  . senna  1 tablet Oral BID  . sertraline  100 mg Oral BID  . tiZANidine  1 mg Oral QID   Continuous Infusions: . sodium chloride 100 mL/hr at 11/06/20 0652  .  ceFAZolin (ANCEF) IV    . methocarbamol (ROBAXIN) IV       LOS: 3 days    Time spent: 30 mins     Charise Killian, MD [facility] Pager [phone]  If 7PM-7AM, please contact night-coverage 11/06/2020, 7:24 AM

## 2020-11-06 NOTE — Progress Notes (Signed)
Subjective: 2 Days Post-Op Procedure(s) (LRB): INTRAMEDULLARY (IM) NAIL INTERTROCHANTRIC (Left) Patient reports pain as moderate.   Patient is well, and has had no acute complaints or problems. Denies any CP, SOB, ABD pain. We will continue therapy today.  Plan is to go [facility] at discharge  Objective: Vital signs in last 24 hours: Temp:  [97.8 F (36.6 C)-99.8 F (37.7 C)] 98.7 F (37.1 C) (12/31 0809) Pulse Rate:  [81-96] 91 (12/31 0809) Resp:  [14-20] 17 (12/31 0809) BP: (82-113)/(40-58) 109/48 (12/31 0809) SpO2:  [92 %-100 %] 95 % (12/31 0809)  Intake/Output from previous day: 12/30 0701 - 12/31 0700 In: 2057.1 [P.O.:360; I.V.:1165.1; Blood:532] Out: 500 [Urine:500] Intake/Output this shift: No intake/output data recorded.  Recent Labs    11/04/20 0505 11/05/20 0416 11/05/20 1432 11/06/20 0024 11/06/20 0504  HGB 9.9* 7.9* 6.4* 7.9* 8.3*   Recent Labs    11/05/20 0416 11/05/20 1432 11/06/20 0024 11/06/20 0504  WBC 9.1  --   --  9.9  RBC 2.33*  --   --  2.54*  HCT 21.7*   < > 22.4* 22.8*  PLT 111*  --   --  123*   < > = values in this interval not displayed.   Recent Labs    11/05/20 0416 11/06/20 0504  NA 137 137  K 3.7 3.1*  CL 104 105  CO2 26 25  BUN 18 12  CREATININE 0.83 0.57  GLUCOSE 158* 131*  CALCIUM 7.8* 7.5*   Recent Labs    11/03/20 1142  INR 0.9    EXAM General - Patient is Alert, Appropriate and Oriented Extremity - Neurovascular intact Sensation intact distally Intact pulses distally Dorsiflexion/Plantar flexion intact No cellulitis present Compartment soft Dressing - Bulky dressing removed from the left hip, new honeycomb dressing applied. Motor Function - intact, moving foot and toes well on exam.   Past Medical History:  Diagnosis Date  . Anemia   . Anemia 08/04/2017  . Anemia 08/04/2017  . Anxiety   . Carpal tunnel syndrome   . Carpal tunnel syndrome   . Cataract cortical, senile   . 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 Chicken pox   . Colitis   . Complication of anesthesia   . Depression   . Dizziness   . DJD (degenerative joint disease)   . Dysphagia   . GERD (gastroesophageal reflux disease)   . Hemorrhoids   . History of hiatal hernia   . History of palpitations    EMOTIONAL PALPITATIONS  . IBS (irritable bowel syndrome)   . Iritis   . Migraines   . Neuropathy   . OCD (obsessive compulsive disorder)   . Osteoarthritis   . Osteoporosis   . Panic attacks   . PONV (postoperative nausea and vomiting)   . Psoriasis   . Psoriasis   . Redundant colon   . REM sleep behavior disorder   . Sleep apnea   . Sleep disorder    REM SLEEP DISORDER  . Slow transit constipation   . Spondylosis    CERVICAL,SLEEPS WITH HOB ELEVATED AND WEARS NECK SUPPORT  . Spondylosis of cervical joint   . Spondylosis of cervical joint     Assessment/Plan:   2 Days Post-Op Procedure(s) (LRB): INTRAMEDULLARY (IM) NAIL INTERTROCHANTRIC (Left) Active Problems:   Hip fracture (HCC)  Estimated body mass index is 29.4 kg/m as calculated from the following:   Height as of 09/15/20: 4\' 10"  (1.473 m).   Weight  as of this encounter: 63.8 kg. Advance diet Up with therapy   Work on BM Patient recevied transfusion yesterday, Hg 8.3 this AM.  Continue to monitor. Honeycomb dressing applied to the left hip this AM. Will discuss with pharmacy about changing heparin to Lovenox for DVT prevention. Discharge to SNF, likely on Monday to [facility].  DVT Prophylaxis - Foot Pumps, TED hose and Heparin Weight-Bearing as tolerated to left leg  J. Horris Latino, PA-C [facility] 11/06/2020, 9:46 AM

## 2020-11-06 NOTE — Plan of Care (Signed)
  Problem: Education: Goal: Knowledge of General Education information will improve Description: Including pain rating scale, medication(s)/side effects and non-pharmacologic comfort measures Outcome: Progressing   Problem: Health Behavior/Discharge Planning: Goal: Ability to manage health-related needs will improve Outcome: Progressing   Problem: Clinical Measurements: Goal: Ability to maintain clinical measurements within normal limits will improve Outcome: Progressing Goal: Will remain free from infection Outcome: Progressing Goal: Diagnostic test results will improve Outcome: Progressing Goal: Respiratory complications will improve Outcome: Progressing Goal: Cardiovascular complication will be avoided Outcome: Progressing   Problem: Nutrition: Goal: Adequate nutrition will be maintained Outcome: Progressing   Problem: Coping: Goal: Level of anxiety will decrease Outcome: Progressing   Problem: Elimination: Goal: Will not experience complications related to bowel motility Outcome: Progressing Goal: Will not experience complications related to urinary retention Outcome: Progressing   Problem: Pain Managment: Goal: General experience of comfort will improve Outcome: Progressing   Problem: Safety: Goal: Ability to remain free from injury will improve Outcome: Progressing   Problem: Skin Integrity: Goal: Risk for impaired skin integrity will decrease Outcome: Progressing   Problem: Education: Goal: Verbalization of understanding the information provided (i.e., activity precautions, restrictions, etc) will improve Outcome: Progressing   Problem: Activity: Goal: Ability to ambulate and perform ADLs will improve Outcome: Progressing   Problem: Clinical Measurements: Goal: Postoperative complications will be avoided or minimized Outcome: Progressing   Problem: Self-Concept: Goal: Ability to maintain and perform role responsibilities to the fullest extent  possible will improve Outcome: Progressing   Problem: Pain Management: Goal: Pain level will decrease Outcome: Progressing

## 2020-11-06 NOTE — Progress Notes (Signed)
Patient had refused ice pack on 11/05/2020 and a.m of 11/06/2020, states it made the pain worse. Dr. Joice Lofts in to educate patient and she agreed to let this writer place ice pack to left hip .

## 2020-11-06 NOTE — Progress Notes (Signed)
Patient c/o squeezing feeling in chest new order for EKG , Triponin level and given PRN Mylanta for indigestion. Patient is alert and responsive , daugther at bedside.

## 2020-11-06 NOTE — Care Management Important Message (Signed)
Important Message  Patient Details  Name: ASMA BOLDON MRN: [id number] Date of Birth: 05-01-1941   Medicare Important Message Given:  Yes     Olegario Messier A Monty Spicher 11/06/2020, 10:41 AM

## 2020-11-07 ENCOUNTER — Other Ambulatory Visit: Payer: Self-pay

## 2020-11-07 DIAGNOSIS — D696 Thrombocytopenia, unspecified: Secondary | ICD-10-CM | POA: Diagnosis not present

## 2020-11-07 DIAGNOSIS — G894 Chronic pain syndrome: Secondary | ICD-10-CM | POA: Diagnosis not present

## 2020-11-07 DIAGNOSIS — S72002A Fracture of unspecified part of neck of left femur, initial encounter for closed fracture: Secondary | ICD-10-CM | POA: Diagnosis not present

## 2020-11-07 LAB — CBC
HCT: 20.4 % — ABNORMAL LOW (ref 36.0–46.0)
Hemoglobin: 7.3 g/dL — ABNORMAL LOW (ref 12.0–15.0)
MCH: 32.3 pg (ref 26.0–34.0)
MCHC: 35.8 g/dL (ref 30.0–36.0)
MCV: 90.3 fL (ref 80.0–100.0)
Platelets: 141 10*3/uL — ABNORMAL LOW (ref 150–400)
RBC: 2.26 MIL/uL — ABNORMAL LOW (ref 3.87–5.11)
RDW: 13.4 % (ref 11.5–15.5)
WBC: 11.2 10*3/uL — ABNORMAL HIGH (ref 4.0–10.5)
nRBC: 0 % (ref 0.0–0.2)

## 2020-11-07 LAB — BASIC METABOLIC PANEL
Anion gap: 8 (ref 5–15)
BUN: 10 mg/dL (ref 8–23)
CO2: 25 mmol/L (ref 22–32)
Calcium: 7.5 mg/dL — ABNORMAL LOW (ref 8.9–10.3)
Chloride: 103 mmol/L (ref 98–111)
Creatinine, Ser: 0.51 mg/dL (ref 0.44–1.00)
GFR, Estimated: >60 mL/min (ref >60)
Glucose, Bld: 114 mg/dL — ABNORMAL HIGH (ref 70–99)
Potassium: 3.5 mmol/L (ref 3.5–5.1)
Sodium: 136 mmol/L (ref 135–145)

## 2020-11-07 MED ORDER — ENOXAPARIN SODIUM 40 MG/0.4ML ~~LOC~~ SOLN: 40.0000 mg | SUBCUTANEOUS | 0 refills | Status: DC

## 2020-11-07 NOTE — Discharge Instructions (Signed)

## 2020-11-07 NOTE — Progress Notes (Signed)
Physical Therapy Treatment Patient Details Name: Sandra Mcconnell MRN: [id number] DOB: Nov 04, 1941 Today's Date: 11/07/2020    History of Present Illness presented to ER secondary to mechanical fall in home environment; admitted for management of reverse obliquity L proximal femur fracture, s/p ORIF (11/04/20), WBAT.  Orders noted for cervical collar with OOB activities (utilized for comfort due to chronic neck pain/weakness per patient/husband report).    PT Comments    Pt ready for session.  Participated in exercises as described below.  Very slow ROM tolerance.  Brace donned.  She is able to get to sitting with max a x 2.  She leans to right but is generally steady in sitting for several minutes. She agrees to try standing.  Stood with mod a x 2 but is able to transition to min a x 1 and heavy lean on walker by staff to keep walker from tipping.  She is able to slightly move RLE in standing but unable to take true steps or transfer to recliner.  Stood for several minutes.  She is fearful and has difficulty flexing at hips and needs assist to get to sitting.  Once sitting she leans heavily to right avoiding WB on right hip.  She needs +3 to transition to supine.  Noted to have significant amount of serous bloody tinged drainage dripping from R hip while standing.  Dressing saturated.  LPN notified to check and change.  Of note HGB 7.3.  Pt asymptomatic and wanting to try.   Follow Up Recommendations  SNF     Equipment Recommendations       Recommendations for Other Services       Precautions / Restrictions Precautions Required Braces or Orthoses: Spinal Brace Spinal Brace: Applied in sitting position Restrictions Weight Bearing Restrictions: No RLE Weight Bearing: Weight bearing as tolerated LLE Weight Bearing: Weight bearing as tolerated    Mobility  Bed Mobility Overal bed mobility: Needs Assistance Bed Mobility: Supine to Sit;Sit to Supine     Supine to sit: Max assist;+2 for  physical assistance Sit to supine: Max assist;+2 for physical assistance;+2 for safety/equipment   General bed mobility comments: +3 to return to supine due to poor hip flexion and heavy lean to foot of bed.  fearful to bend due to pain  Transfers Overall transfer level: Needs assistance Equipment used: Standard walker Transfers: Sit to/from Stand Sit to Stand: Min assist;Mod assist;+2 physical assistance         General transfer comment: overall does suprisingly well standing and is able to stand with min a x 1 and holding walker down. unable to step or transfer to recliner  Ambulation/Gait                 Stairs             Wheelchair Mobility    Modified Rankin (Stroke Patients Only)       Balance Overall balance assessment: Needs assistance Sitting-balance support: No upper extremity supported;Feet supported Sitting balance-Leahy Scale: Poor     Standing balance support: Bilateral upper extremity supported Standing balance-Leahy Scale: Poor Standing balance comment: able to stand for several minutes but needs pressure to hold walker down and correct right lean                            Cognition Arousal/Alertness: Awake/alert Behavior During Therapy: WFL for tasks assessed/performed Overall Cognitive Status: Within Functional Limits for tasks assessed  General Comments: repeats directions during exercises and transitions.      Exercises Other Exercises Other Exercises: Supine L LE therex, 1x10, act assist ROM: ankle pumps, quad sets, SAQs, heel slides, hip abduct/adduct.  Very guarded movement, very limited range tolerated (due to pain)    General Comments        Pertinent Vitals/Pain Pain Assessment: Faces Faces Pain Scale: Hurts whole lot Pain Location: L hip Pain Descriptors / Indicators: Aching;Grimacing;Guarding Pain Intervention(s): Limited activity within patient's  tolerance;Monitored during session;Premedicated before session;Repositioned    Home Living                      Prior Function            PT Goals (current goals can now be found in the care plan section) Progress towards PT goals: Progressing toward goals    Frequency    7X/week      PT Plan Current plan remains appropriate    Co-evaluation              AM-PAC PT "6 Clicks" Mobility   Outcome Measure  Help needed turning from your back to your side while in a flat bed without using bedrails?: Total Help needed moving from lying on your back to sitting on the side of a flat bed without using bedrails?: Total Help needed moving to and from a bed to a chair (including a wheelchair)?: Total Help needed standing up from a chair using your arms (e.g., wheelchair or bedside chair)?: Total Help needed to walk in hospital room?: Total Help needed climbing 3-5 steps with a railing? : Total 6 Click Score: 6    End of Session Equipment Utilized During Treatment: Gait belt Activity Tolerance: Patient limited by pain Patient left: in bed;with call bell/phone within reach;with bed alarm set;with family/visitor present Nurse Communication: Mobility status       Time: [id number] PT Time Calculation (min) (ACUTE ONLY): 31 min  Charges:  $Therapeutic Exercise: 8-22 mins $Therapeutic Activity: 8-22 mins                    Danielle Dess, PTA 11/07/20, 11:19 AM

## 2020-11-07 NOTE — Progress Notes (Signed)
Subjective: 3 Days Post-Op Procedure(s) (LRB): INTRAMEDULLARY (IM) NAIL INTERTROCHANTRIC (Left) Patient reports pain as moderate.   Patient is well, and has had no acute complaints or problems. Denies any CP, SOB, ABD pain. We will continue therapy today.  Plan is to go [facility] at discharge, probably on Monday. Patient with continued hypotension.  Objective: Vital signs in last 24 hours: Temp:  [98.2 F (36.8 C)-100.7 F (38.2 C)] 98.5 F (36.9 C) (01/01 0815) Pulse Rate:  [75-92] 92 (01/01 0815) Resp:  [16-19] 19 (01/01 0815) BP: (90-108)/(45-56) 105/53 (01/01 0815) SpO2:  [89 %-98 %] 93 % (01/01 0815)  Intake/Output from previous day: 12/31 0701 - 01/01 0700 In: -  Out: 450 [Urine:450] Intake/Output this shift: No intake/output data recorded.  Recent Labs    11/05/20 0416 11/05/20 1432 11/06/20 0024 11/06/20 0504 11/07/20 0549  HGB 7.9* 6.4* 7.9* 8.3* 7.3*   Recent Labs    11/06/20 0504 11/07/20 0549  WBC 9.9 11.2*  RBC 2.54* 2.26*  HCT 22.8* 20.4*  PLT 123* 141*   Recent Labs    11/06/20 0504 11/07/20 0549  NA 137 136  K 3.1* 3.5  CL 105 103  CO2 25 25  BUN 12 10  CREATININE 0.57 0.51  GLUCOSE 131* 114*  CALCIUM 7.5* 7.5*   No results for input(s): LABPT, INR in the last 72 hours.  EXAM General - Patient is Alert, Appropriate and Oriented Extremity - Neurovascular intact Sensation intact distally Intact pulses distally Dorsiflexion/Plantar flexion intact No cellulitis present Compartment soft Dressing - Mild bloody drainage to distal incision, mild serosanguinous drainage to proximal incision. Motor Function - intact, moving foot and toes well on exam.   Past Medical History:  Diagnosis Date  . Anemia   . Anemia 08/04/2017  . Anemia 08/04/2017  . Anxiety   . Carpal tunnel syndrome   . Carpal tunnel syndrome   . Cataract cortical, senile   . Cervical spinal cord compression ([place])   . Chicken pox   . Colitis   . Complication of  anesthesia   . Depression   . Dizziness   . DJD (degenerative joint disease)   . Dysphagia   . GERD (gastroesophageal reflux disease)   . Hemorrhoids   . History of hiatal hernia   . History of palpitations    EMOTIONAL PALPITATIONS  . IBS (irritable bowel syndrome)   . Iritis   . Migraines   . Neuropathy   . OCD (obsessive compulsive disorder)   . Osteoarthritis   . Osteoporosis   . Panic attacks   . PONV (postoperative nausea and vomiting)   . Psoriasis   . Psoriasis   . Redundant colon   . REM sleep behavior disorder   . Sleep apnea   . Sleep disorder    REM SLEEP DISORDER  . Slow transit constipation   . Spondylosis    CERVICAL,SLEEPS WITH HOB ELEVATED AND WEARS NECK SUPPORT  . Spondylosis of cervical joint   . Spondylosis of cervical joint     Assessment/Plan:   3 Days Post-Op Procedure(s) (LRB): INTRAMEDULLARY (IM) NAIL INTERTROCHANTRIC (Left) Active Problems:   Hip fracture (HCC)  Estimated body mass index is 29.4 kg/m as calculated from the following:   Height as of 09/15/20: 4\' 10"  (1.473 m).   Weight as of this encounter: 63.8 kg. Advance diet Up with therapy   Work on BM Patient recevied transfusion 11/05/20, Hg 7.3 this AM.  Continue to monitor. Honeycomb dressing intact to the left  hip. Switched from Heparin to Lovenox yesterday for DVT prevention. Low grade fever last night, given incentive spirometer this AM and instructed on use. Continue with PT. Plan for discharge to SNF on Monday.  DVT Prophylaxis - Lovenox, Foot Pumps and TED hose Weight-Bearing as tolerated to left leg  J. Horris Latino, PA-C [facility] 11/07/2020, 8:33 AM

## 2020-11-07 NOTE — Progress Notes (Signed)
PROGRESS NOTE    Sandra Mcconnell  TJQ:[id number] DOB: 1941-09-29 DOA: 11/03/2020 PCP: Dorothey Baseman, MD   Assessment & Plan:   Active Problems:   Hip fracture (HCC)   Left hip fracture: s/p mechanical fall. S/p intramedullary nail intertrochanteric & open reduction on 11/04/20 as per ortho surg. Oxycodone, dilaudid prn for pain. PT recs SNF. Low grade fever overnight, etiology unclear. Will continue to monitor   Hypokalemia: WNL today  Thrombocytopenia: etiology unclear. 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 Restart home dose of aspirin when ok with ortho surg   Depression/PTSD: severity unknown. Continue on home dose of prazosin, seroquel, sertraline.    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 Continue on home dose of gabapentin, tizandidine   GERD: continue on PPI   Normocytic anemia: w/ acute post op anemia. S/p 1 unit of pRBCs transfused. H&H are labile. Will transfuse if Hb < 7.0.  DVT prophylaxis: heparin Code Status: full  Family Communication: discussed pt's care w/ pt's family at bedside and answered their questions  Disposition Plan: likely d/c to SNF   Status is: Inpatient  Remains inpatient appropriate because:Ongoing diagnostic testing needed not appropriate for outpatient work up, Unsafe d/c plan and IV treatments appropriate due to intensity of illness or inability to take PO, waiting on SNF placement    Dispo: The patient is from: Home              Anticipated d/c is to: SNF              Anticipated d/c date is: 3 days              Patient currently is not medically stable to d/c.        Consultants:   Ortho surg    Procedures:    Antimicrobials:   Subjective: Pt still c/o chronic pain   Objective: Vitals:   11/06/20 1532 11/06/20 1922 11/06/20 2324 11/07/20 0429  BP: (!) 101/56 (!) 90/45 (!) 108/46 (!) 95/50   Pulse: 87 81 90 81  Resp: 17 16 16 16   Temp: 98.2 F (36.8 C) 98.7 F (37.1 C) (!) 100.7 F (38.2 C) 98.7 F (37.1 C)  TempSrc:      SpO2: 98% 93% 95% 94%  Weight:        Intake/Output Summary (Last 24 hours) at 11/07/2020 0720 Last data filed at 11/07/2020 0436 Gross per 24 hour  Intake --  Output 450 ml  Net -450 ml   Filed Weights   11/04/20 0944  Weight: 63.8 kg    Examination:  General exam: Appears calm & comfortable   Respiratory system: clear breath sounds b/l. No wheezes, rales   Cardiovascular system: S1/S2+. No clicks or gallops  Gastrointestinal system: Abd is soft, NT, ND & hypoactive bowel sounds  Central nervous system: Alert and oriented. Moves all 4 extremities  Psychiatry: Judgement and insight appears normal. Flat mood and affect     Data Reviewed: I have personally reviewed following labs and imaging studies  CBC: Recent Labs  Lab 11/03/20 1428 11/04/20 0505 11/05/20 0416 11/05/20 1432 11/06/20 0024 11/06/20 0504 11/07/20 0549  WBC 12.6* 10.1 9.1  --   --  9.9 11.2*  HGB 9.4* 9.9* 7.9* 6.4* 7.9* 8.3* 7.3*  HCT 26.3* 27.4* 21.7* 18.1* 22.4* 22.8* 20.4*  MCV 92.3 92.9 93.1  --   --  89.8 90.3  PLT 151 154 111*  --   --  123* Q000111Q*   Basic Metabolic Panel: Recent Labs  Lab 11/03/20 1059 11/03/20 1428 11/04/20 0505 11/05/20 0416 11/06/20 0504 11/07/20 0549  NA 138  --  139 137 137 136  K 4.0  --  3.4* 3.7 3.1* 3.5  CL 105  --  102 104 105 103  CO2 27  --  29 26 25 25   GLUCOSE 89  --  143* 158* 131* 114*  BUN 16  --  17 18 12 10   CREATININE 0.72 0.58 0.70 0.83 0.57 0.51  CALCIUM 8.6*  --  8.1* 7.8* 7.5* 7.5*   GFR: Estimated Creatinine Clearance: 43.6 mL/min (by C-G formula based on SCr of 0.51 mg/dL). Liver Function Tests: No results for input(s): AST, ALT, ALKPHOS, BILITOT, PROT, ALBUMIN in the last 168 hours. No results for input(s): LIPASE, AMYLASE in the last 168 hours. No results for input(s): AMMONIA in the last 168  hours. Coagulation Profile: Recent Labs  Lab 11/03/20 1142  INR 0.9   Cardiac Enzymes: No results for input(s): CKTOTAL, CKMB, CKMBINDEX, TROPONINI in the last 168 hours. BNP (last 3 results) No results for input(s): PROBNP in the last 8760 hours. HbA1C: No results for input(s): HGBA1C in the last 72 hours. CBG: No results for input(s): GLUCAP in the last 168 hours. Lipid Profile: No results for input(s): CHOL, HDL, LDLCALC, TRIG, CHOLHDL, LDLDIRECT in the last 72 hours. Thyroid Function Tests: No results for input(s): TSH, T4TOTAL, FREET4, T3FREE, THYROIDAB in the last 72 hours. Anemia Panel: No results for input(s): VITAMINB12, FOLATE, FERRITIN, TIBC, IRON, RETICCTPCT in the last 72 hours. Sepsis Labs: No results for input(s): PROCALCITON, LATICACIDVEN in the last 168 hours.  Recent Results (from the past 240 hour(s))  Resp Panel by RT-PCR (Flu A&B, Covid) Nasopharyngeal Swab     Status: None   Collection Time: 11/03/20  2:43 PM   Specimen: Nasopharyngeal Swab; Nasopharyngeal(NP) swabs in vial transport medium  Result Value Ref Range Status   SARS Coronavirus 2 by RT PCR NEGATIVE NEGATIVE Final    Comment: (NOTE) SARS-CoV-2 target nucleic acids are NOT DETECTED.  The SARS-CoV-2 RNA is generally detectable in upper respiratory specimens during the acute phase of infection. The lowest concentration of SARS-CoV-2 viral copies this assay can detect is 138 copies/mL. A negative result does not preclude SARS-Cov-2 infection and should not be used as the sole basis for treatment or other patient management decisions. A negative result may occur with  improper specimen collection/handling, submission of specimen other than nasopharyngeal swab, presence of viral mutation(s) within the areas targeted by this assay, and inadequate number of viral copies(<138 copies/mL). A negative result must be combined with clinical observations, patient history, and epidemiological information.  The expected result is Negative.  Fact Sheet for Patients:  EntrepreneurPulse.com.au  Fact Sheet for Healthcare Providers:  IncredibleEmployment.be  This test is no t yet approved or cleared by the [place] FDA and  has been authorized for detection and/or diagnosis of SARS-CoV-2 by FDA under an Emergency Use Authorization (EUA). This EUA will remain  in effect (meaning this test can be used) for the duration of the COVID-19 declaration under Section 564(b)(1) of the Act, 21 U.S.C.section 360bbb-3(b)(1), unless the authorization is terminated  or revoked sooner.       Influenza A by PCR NEGATIVE NEGATIVE Final   Influenza B by PCR NEGATIVE NEGATIVE Final    Comment: (NOTE) The Xpert Xpress SARS-CoV-2/FLU/RSV plus  assay is intended as an aid in the diagnosis of influenza from Nasopharyngeal swab specimens and should not be used as a sole basis for treatment. Nasal washings and aspirates are unacceptable for Xpert Xpress SARS-CoV-2/FLU/RSV testing.  Fact Sheet for Patients: EntrepreneurPulse.com.au  Fact Sheet for Healthcare Providers: IncredibleEmployment.be  This test is not yet approved or cleared by the [place] FDA and has been authorized for detection and/or diagnosis of SARS-CoV-2 by FDA under an Emergency Use Authorization (EUA). This EUA will remain in effect (meaning this test can be used) for the duration of the COVID-19 declaration under Section 564(b)(1) of the Act, 21 U.S.C. section 360bbb-3(b)(1), unless the authorization is terminated or revoked.  Performed at [facility], [address] [place], [place] [address]          Radiology Studies: No results found.      Scheduled Meds: . sodium chloride   Intravenous Once  . acidophilus  1 capsule Oral Q1500  . [START ON 11/09/2020] buprenorphine  1 patch Transdermal Q Mon  . Chlorhexidine Gluconate Cloth  6  each Topical Q0600  . enoxaparin (LOVENOX) injection  40 mg Subcutaneous Q24H  . feeding supplement  237 mL Oral BID BM  . ferrous Q000111Q C-folic acid  1 capsule Oral BID  . gabapentin  800 mg Oral TID  . midodrine  10 mg Oral TID  . multivitamin with minerals  1 tablet Oral Daily  . oxyCODONE  15 mg Oral Q12H  . pantoprazole  40 mg Oral Daily  . prazosin  2 mg Oral TID  . QUEtiapine  25 mg Oral QHS  . rOPINIRole  1 mg Oral QHS  . senna  1 tablet Oral BID  . sertraline  100 mg Oral BID  . tiZANidine  2 mg Oral QID   Continuous Infusions: . sodium chloride 100 mL/hr at 11/06/20 2228  .  ceFAZolin (ANCEF) IV    . methocarbamol (ROBAXIN) IV       LOS: 4 days    Time spent: 31 mins     Wyvonnia Dusky, MD [facility] Pager [phone]  If 7PM-7AM, please contact night-coverage 11/07/2020, 7:20 AM

## 2020-11-08 ENCOUNTER — Inpatient Hospital Stay: Payer: Medicare PPO

## 2020-11-08 DIAGNOSIS — D696 Thrombocytopenia, unspecified: Secondary | ICD-10-CM | POA: Diagnosis not present

## 2020-11-08 DIAGNOSIS — S72002A Fracture of unspecified part of neck of left femur, initial encounter for closed fracture: Secondary | ICD-10-CM | POA: Diagnosis not present

## 2020-11-08 DIAGNOSIS — G894 Chronic pain syndrome: Secondary | ICD-10-CM | POA: Diagnosis not present

## 2020-11-08 LAB — BASIC METABOLIC PANEL
Anion gap: 8 (ref 5–15)
BUN: 11 mg/dL (ref 8–23)
CO2: 24 mmol/L (ref 22–32)
Calcium: 7.6 mg/dL — ABNORMAL LOW (ref 8.9–10.3)
Chloride: 104 mmol/L (ref 98–111)
Creatinine, Ser: 0.63 mg/dL (ref 0.44–1.00)
GFR, Estimated: >60 mL/min (ref >60)
Glucose, Bld: 104 mg/dL — ABNORMAL HIGH (ref 70–99)
Potassium: 3.4 mmol/L — ABNORMAL LOW (ref 3.5–5.1)
Sodium: 136 mmol/L (ref 135–145)

## 2020-11-08 LAB — CBC
HCT: 19.7 % — ABNORMAL LOW (ref 36.0–46.0)
Hemoglobin: 6.8 g/dL — ABNORMAL LOW (ref 12.0–15.0)
MCH: 32.4 pg (ref 26.0–34.0)
MCHC: 34.5 g/dL (ref 30.0–36.0)
MCV: 93.8 fL (ref 80.0–100.0)
Platelets: 146 10*3/uL — ABNORMAL LOW (ref 150–400)
RBC: 2.1 MIL/uL — ABNORMAL LOW (ref 3.87–5.11)
RDW: 13.2 % (ref 11.5–15.5)
WBC: 10.7 10*3/uL — ABNORMAL HIGH (ref 4.0–10.5)
nRBC: 0 % (ref 0.0–0.2)

## 2020-11-08 LAB — HEMOGLOBIN AND HEMATOCRIT, BLOOD
HCT: 23.8 % — ABNORMAL LOW (ref 36.0–46.0)
Hemoglobin: 8.3 g/dL — ABNORMAL LOW (ref 12.0–15.0)

## 2020-11-08 LAB — TYPE AND SCREEN
ABO/RH(D): O POS
Antibody Screen: NEGATIVE

## 2020-11-08 LAB — PREPARE RBC (CROSSMATCH)

## 2020-11-08 MED ORDER — POLYETHYLENE GLYCOL 3350 17 G PO PACK
17.0000 g | PACK | Freq: Every day | ORAL | Status: DC
  Administered 2020-11-08 – 2020-11-10 (×3): 17 g via ORAL
  Filled 2020-11-08 (×5): qty 1

## 2020-11-08 MED ORDER — SODIUM CHLORIDE 0.9% IV SOLUTION: Freq: Once | INTRAVENOUS | Status: DC

## 2020-11-08 MED ORDER — TIZANIDINE HCL 2 MG PO TABS
1.0000 mg | ORAL_TABLET | Freq: Four times a day (QID) | ORAL | Status: DC
  Administered 2020-11-08 – 2020-11-12 (×16): 1 mg via ORAL
  Filled 2020-11-08 (×20): qty 0.5

## 2020-11-08 MED ORDER — POTASSIUM CHLORIDE CRYS ER 20 MEQ PO TBCR
20.0000 meq | EXTENDED_RELEASE_TABLET | Freq: Once | ORAL | Status: AC
  Administered 2020-11-08: 20 meq via ORAL
  Filled 2020-11-08: qty 1

## 2020-11-08 NOTE — Progress Notes (Signed)
PT Cancellation Note  Patient Details Name: Sandra Mcconnell MRN: [id number] DOB: 23-Feb-1941   Cancelled Treatment:    Reason Eval/Treat Not Completed: Medical issues which prohibited therapy   Pt with fever this am, general malaise and HgB 6.8.  Awaiting blood transfusion.  Will hold session and continue as appropriate tomorrow.   Danielle Dess 11/08/2020, 10:45 AM

## 2020-11-08 NOTE — Progress Notes (Signed)
   11/05/20 0700  Assess: if the MEWS score is Yellow or Red  Were vital signs taken at a resting state? Yes  Focused Assessment No change from prior assessment  Early Detection of Sepsis Score *See Row Information* Low  MEWS guidelines implemented *See Row Information* Yes  Treat  MEWS Interventions Escalated (See documentation below)  Take Vital Signs  Increase Vital Sign Frequency  Yellow: Q 2hr X 2 then Q 4hr X 2, if remains yellow, continue Q 4hrs  Escalate  MEWS: Escalate Yellow: discuss with charge nurse/RN and consider discussing with provider and RRT  Notify: Charge Nurse/RN  Name of Charge Nurse/RN Notified Victoria, RN  Date Charge Nurse/RN Notified 11/05/20  Time Charge Nurse/RN Notified 0740  Notify: Provider  Provider Name/Title Dr. Mayford Knife  Date Provider Notified 11/05/20  Time Provider Notified 0740  Notification Type Page  Notification Reason Change in status

## 2020-11-08 NOTE — Progress Notes (Signed)
PROGRESS NOTE    Sandra Mcconnell  [id number] DOB: February 19, 1941 DOA: 11/03/2020 PCP: Juluis Pitch, MD   Assessment & Plan:   Active Problems:   Hip fracture (HCC)   Left hip fracture: s/p mechanical fall. S/p intramedullary nail intertrochanteric & open reduction on 11/04/20 as per ortho surg. Oxycodone, dilaudid prn for pain. PT recs SNF. Low grade fever overnight, etiology unclear. Will continue to monitor   Fever: etiology unclear. Blood cx & urine cxs ordered. Repeat CXR ordered   Hypokalemia: KCl repleted. Will continue to monitor   Thrombocytopenia: etiology unclear. 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 Restart home dose of aspirin when ok with ortho surg   Depression/PTSD: severity unknown. 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 Continue on home dose of gabapentin, tizandidine   GERD: continue on PPI   Normocytic anemia: w/ acute post op anemia. S/p 1 unit of pRBCs transfused & will transfuse 1 unit more of pRBCs for Hb 6.8. Repeat H&H 4 hours post transfusion.   DVT prophylaxis: heparin Code Status: full  Family Communication: discussed pt's care w/ pt's family at bedside and answered their questions  Disposition Plan: likely d/c to SNF   Status is: Inpatient  Remains inpatient appropriate because:Ongoing diagnostic testing needed not appropriate for outpatient work up, Unsafe d/c plan and IV treatments appropriate due to intensity of illness or inability to take PO, waiting on SNF placement    Dispo: The patient is from: Home              Anticipated d/c is to: SNF              Anticipated d/c date is: 3 days              Patient currently is not medically stable to d/c.        Consultants:   Ortho surg    Procedures:    Antimicrobials:   Subjective: Pt c/o leg  pain   Objective: Vitals:   11/07/20 2103 11/07/20 2359 11/08/20 0025 11/08/20 0458  BP: (!) 94/50  (!) 98/50 (!) 110/48  Pulse: 85  82   Resp: 18  19 18   Temp: 99.5 F (37.5 C)  98.8 F (37.1 C) 98.8 F (37.1 C)  TempSrc:      SpO2: 90%  94% 93%  Weight:      Height:  5' (1.524 m)      Intake/Output Summary (Last 24 hours) at 11/08/2020 0713 Last data filed at 11/08/2020 0500 Gross per 24 hour  Intake 240 ml  Output 300 ml  Net -60 ml   Filed Weights   11/04/20 0944  Weight: 63.8 kg    Examination:  General exam: Appears calm & uncomfortable  Respiratory system: diminished breath sounds b/l. No wheezes Cardiovascular system: S1/S2+. No rubs or clicks  Gastrointestinal system: Abd is soft, NT, ND & hypoactive bowel sounds  Central nervous system: Alert and awake. Moves all 4 extremities  Psychiatry: Judgement and insight appears abnormal. Flat mood and affect     Data Reviewed: I have personally reviewed following labs and imaging studies  CBC: Recent Labs  Lab 11/04/20 0505 11/05/20 0416 11/05/20 1432 11/06/20 0024 11/06/20 0504 11/07/20 0549 11/08/20 0502  WBC 10.1 9.1  --   --  9.9 11.2*  10.7*  HGB 9.9* 7.9* 6.4* 7.9* 8.3* 7.3* 6.8*  HCT 27.4* 21.7* 18.1* 22.4* 22.8* 20.4* 19.7*  MCV 92.9 93.1  --   --  89.8 90.3 93.8  PLT 154 111*  --   --  123* 141* [id number]*   Basic Metabolic Panel: Recent Labs  Lab 11/04/20 0505 11/05/20 0416 11/06/20 0504 11/07/20 0549 11/08/20 0502  NA 139 137 137 136 136  K 3.4* 3.7 3.1* 3.5 3.4*  CL 102 104 105 103 104  CO2 29 26 25 25 24   GLUCOSE 143* 158* 131* 114* 104*  BUN 17 18 12 10 11   CREATININE 0.70 0.83 0.57 0.51 0.63  CALCIUM 8.1* 7.8* 7.5* 7.5* 7.6*   GFR: Estimated Creatinine Clearance: 46 mL/min (by C-G formula based on SCr of 0.63 mg/dL). Liver Function Tests: No results for input(s): AST, ALT, ALKPHOS, BILITOT, PROT, ALBUMIN in the last 168 hours. No results for input(s): LIPASE, AMYLASE in the last 168  hours. No results for input(s): AMMONIA in the last 168 hours. Coagulation Profile: Recent Labs  Lab 11/03/20 1142  INR 0.9   Cardiac Enzymes: No results for input(s): CKTOTAL, CKMB, CKMBINDEX, TROPONINI in the last 168 hours. BNP (last 3 results) No results for input(s): PROBNP in the last 8760 hours. HbA1C: No results for input(s): HGBA1C in the last 72 hours. CBG: No results for input(s): GLUCAP in the last 168 hours. Lipid Profile: No results for input(s): CHOL, HDL, LDLCALC, TRIG, CHOLHDL, LDLDIRECT in the last 72 hours. Thyroid Function Tests: No results for input(s): TSH, T4TOTAL, FREET4, T3FREE, THYROIDAB in the last 72 hours. Anemia Panel: No results for input(s): VITAMINB12, FOLATE, FERRITIN, TIBC, IRON, RETICCTPCT in the last 72 hours. Sepsis Labs: No results for input(s): PROCALCITON, LATICACIDVEN in the last 168 hours.  Recent Results (from the past 240 hour(s))  Resp Panel by RT-PCR (Flu A&B, Covid) Nasopharyngeal Swab     Status: None   Collection Time: 11/03/20  2:43 PM   Specimen: Nasopharyngeal Swab; Nasopharyngeal(NP) swabs in vial transport medium  Result Value Ref Range Status   SARS Coronavirus 2 by RT PCR NEGATIVE NEGATIVE Final    Comment: (NOTE) SARS-CoV-2 target nucleic acids are NOT DETECTED.  The SARS-CoV-2 RNA is generally detectable in upper respiratory specimens during the acute phase of infection. The lowest concentration of SARS-CoV-2 viral copies this assay can detect is 138 copies/mL. A negative result does not preclude SARS-Cov-2 infection and should not be used as the sole basis for treatment or other patient management decisions. A negative result may occur with  improper specimen collection/handling, submission of specimen other than nasopharyngeal swab, presence of viral mutation(s) within the areas targeted by this assay, and inadequate number of viral copies(<138 copies/mL). A negative result must be combined with clinical  observations, patient history, and epidemiological information. The expected result is Negative.  Fact Sheet for Patients:  EntrepreneurPulse.com.au  Fact Sheet for Healthcare Providers:  IncredibleEmployment.be  This test is no t yet approved or cleared by the [place] FDA and  has been authorized for detection and/or diagnosis of SARS-CoV-2 by FDA under an Emergency Use Authorization (EUA). This EUA will remain  in effect (meaning this test can be used) for the duration of the COVID-19 declaration under Section 564(b)(1) of the Act, 21 U.S.C.section 360bbb-3(b)(1), unless the authorization is terminated  or revoked sooner.       Influenza A by PCR NEGATIVE NEGATIVE Final   Influenza B by PCR NEGATIVE NEGATIVE Final    Comment: (  NOTE) The Xpert Xpress SARS-CoV-2/FLU/RSV plus assay is intended as an aid in the diagnosis of influenza from Nasopharyngeal swab specimens and should not be used as a sole basis for treatment. Nasal washings and aspirates are unacceptable for Xpert Xpress SARS-CoV-2/FLU/RSV testing.  Fact Sheet for Patients: BloggerCourse.com  Fact Sheet for Healthcare Providers: SeriousBroker.it  This test is not yet approved or cleared by the [place] FDA and has been authorized for detection and/or diagnosis of SARS-CoV-2 by FDA under an Emergency Use Authorization (EUA). This EUA will remain in effect (meaning this test can be used) for the duration of the COVID-19 declaration under Section 564(b)(1) of the Act, 21 U.S.C. section 360bbb-3(b)(1), unless the authorization is terminated or revoked.  Performed at [facility], [address], [place], [place] [address]          Radiology Studies: No results found.      Scheduled Meds: . sodium chloride   Intravenous Once  . sodium chloride   Intravenous Once  . acidophilus  1 capsule Oral Q1500   . [START ON 11/09/2020] buprenorphine  1 patch Transdermal Q Mon  . Chlorhexidine Gluconate Cloth  6 each Topical Q0600  . enoxaparin (LOVENOX) injection  40 mg Subcutaneous Q24H  . feeding supplement  237 mL Oral BID BM  . ferrous fumarate-b12-vitamic C-folic acid  1 capsule Oral BID  . gabapentin  800 mg Oral TID  . midodrine  10 mg Oral TID  . multivitamin with minerals  1 tablet Oral Daily  . oxyCODONE  15 mg Oral Q12H  . pantoprazole  40 mg Oral Daily  . potassium chloride  20 mEq Oral Once  . prazosin  2 mg Oral TID  . QUEtiapine  25 mg Oral QHS  . rOPINIRole  1 mg Oral QHS  . senna  1 tablet Oral BID  . sertraline  100 mg Oral BID  . tiZANidine  2 mg Oral QID   Continuous Infusions: . sodium chloride 100 mL/hr at 11/07/20 2205  .  ceFAZolin (ANCEF) IV    . methocarbamol (ROBAXIN) IV       LOS: 5 days    Time spent: 33 mins     Charise Killian, MD [facility] Pager [phone]  If 7PM-7AM, please contact night-coverage 11/08/2020, 7:13 AM

## 2020-11-08 NOTE — Progress Notes (Addendum)
Subjective: 4 Days Post-Op Procedure(s) (LRB): INTRAMEDULLARY (IM) NAIL INTERTROCHANTRIC (Left) Patient reports pain as moderate to the left hip Patient states she doesn't feel well this Am, Hg 6.7. Denies any CP, SOB, ABD pain. We will continue therapy today.  Plan is to go [place] at discharge, probably on Monday.  Objective: Vital signs in last 24 hours: Temp:  [98.8 F (37.1 C)-101.9 F (38.8 C)] 101.9 F (38.8 C) (01/02 0811) Pulse Rate:  [80-95] 95 (01/02 0811) Resp:  [16-19] 19 (01/02 0811) BP: (94-121)/(48-72) 121/72 (01/02 0811) SpO2:  [90 %-96 %] 92 % (01/02 0811)  Intake/Output from previous day: 01/01 0701 - 01/02 0700 In: 240 [P.O.:240] Out: 400 [Urine:400] Intake/Output this shift: No intake/output data recorded.  Recent Labs    11/05/20 1432 11/06/20 0024 11/06/20 0504 11/07/20 0549 11/08/20 0502  HGB 6.4* 7.9* 8.3* 7.3* 6.8*   Recent Labs    11/07/20 0549 11/08/20 0502  WBC 11.2* 10.7*  RBC 2.26* 2.10*  HCT 20.4* 19.7*  PLT 141* 146*   Recent Labs    11/07/20 0549 11/08/20 0502  NA 136 136  K 3.5 3.4*  CL 103 104  CO2 25 24  BUN 10 11  CREATININE 0.51 0.63  GLUCOSE 114* 104*  CALCIUM 7.5* 7.6*   No results for input(s): LABPT, INR in the last 72 hours.  EXAM General - Patient is Alert, Appropriate and Oriented Extremity - Neurovascular intact Sensation intact distally Intact pulses distally Dorsiflexion/Plantar flexion intact No cellulitis present Compartment soft  No erythema to the left hip. Dressing - Clean honeycomb dressings to the left hip, no signs of infection Motor Function - intact, moving foot and toes well on exam.   Past Medical History:  Diagnosis Date  . Anemia   . Anemia 08/04/2017  . Anemia 08/04/2017  . Anxiety   . Carpal tunnel syndrome   . Carpal tunnel syndrome   . Cataract cortical, senile   . 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 Chicken pox   . Colitis   . Complication of anesthesia    . Depression   . Dizziness   . DJD (degenerative joint disease)   . Dysphagia   . GERD (gastroesophageal reflux disease)   . Hemorrhoids   . History of hiatal hernia   . History of palpitations    EMOTIONAL PALPITATIONS  . IBS (irritable bowel syndrome)   . Iritis   . Migraines   . Neuropathy   . OCD (obsessive compulsive disorder)   . Osteoarthritis   . Osteoporosis   . Panic attacks   . PONV (postoperative nausea and vomiting)   . Psoriasis   . Psoriasis   . Redundant colon   . REM sleep behavior disorder   . Sleep apnea   . Sleep disorder    REM SLEEP DISORDER  . Slow transit constipation   . Spondylosis    CERVICAL,SLEEPS WITH HOB ELEVATED AND WEARS NECK SUPPORT  . Spondylosis of cervical joint   . Spondylosis of cervical joint     Assessment/Plan:   4 Days Post-Op Procedure(s) (LRB): INTRAMEDULLARY (IM) NAIL INTERTROCHANTRIC (Left) Active Problems:   Hip fracture (HCC)  Estimated body mass index is 27.47 kg/m as calculated from the following:   Height as of this encounter: 5' (1.524 m).   Weight as of this encounter: 63.8 kg. Advance diet Up with therapy   Work on BM Hg 6.7 this AM, plan for repeat transfusion today. Honeycomb dressing intact to the left  hip without drainage Encouraged incentive spirometer again Continue with PT if able today. Blood Cultures ordered due to fever this AM.  DVT Prophylaxis - Lovenox, Foot Pumps and TED hose Weight-Bearing as tolerated to left leg  J. Horris Latino, PA-C [facility] 11/08/2020, 10:22 AM

## 2020-11-09 ENCOUNTER — Ambulatory Visit: Payer: Medicare PPO | Admitting: Dermatology

## 2020-11-09 DIAGNOSIS — E876 Hypokalemia: Secondary | ICD-10-CM | POA: Diagnosis not present

## 2020-11-09 DIAGNOSIS — S72002A Fracture of unspecified part of neck of left femur, initial encounter for closed fracture: Secondary | ICD-10-CM | POA: Diagnosis not present

## 2020-11-09 DIAGNOSIS — J181 Lobar pneumonia, unspecified organism: Secondary | ICD-10-CM | POA: Diagnosis not present

## 2020-11-09 LAB — TYPE AND SCREEN
ABO/RH(D): O POS
Antibody Screen: NEGATIVE
Unit division: 0
Unit division: 0
Unit division: 0

## 2020-11-09 LAB — BASIC METABOLIC PANEL
Anion gap: 8 (ref 5–15)
BUN: 11 mg/dL (ref 8–23)
CO2: 25 mmol/L (ref 22–32)
Calcium: 7.5 mg/dL — ABNORMAL LOW (ref 8.9–10.3)
Chloride: 105 mmol/L (ref 98–111)
Creatinine, Ser: 0.58 mg/dL (ref 0.44–1.00)
GFR, Estimated: >60 mL/min (ref >60)
Glucose, Bld: 108 mg/dL — ABNORMAL HIGH (ref 70–99)
Potassium: 3.3 mmol/L — ABNORMAL LOW (ref 3.5–5.1)
Sodium: 138 mmol/L (ref 135–145)

## 2020-11-09 LAB — BPAM RBC
Blood Product Expiration Date: 202201032359
Blood Product Expiration Date: 202201172359
Blood Product Expiration Date: 202202012359
ISSUE DATE / TIME: 202112301808
ISSUE DATE / TIME: 202201021146
ISSUE DATE / TIME: 202201022128
Unit Type and Rh: 5100
Unit Type and Rh: 5100
Unit Type and Rh: 9500

## 2020-11-09 LAB — CBC
HCT: 23.4 % — ABNORMAL LOW (ref 36.0–46.0)
Hemoglobin: 8.2 g/dL — ABNORMAL LOW (ref 12.0–15.0)
MCH: 32 pg (ref 26.0–34.0)
MCHC: 35 g/dL (ref 30.0–36.0)
MCV: 91.4 fL (ref 80.0–100.0)
Platelets: 205 10*3/uL (ref 150–400)
RBC: 2.56 MIL/uL — ABNORMAL LOW (ref 3.87–5.11)
RDW: 13.1 % (ref 11.5–15.5)
WBC: 11.5 10*3/uL — ABNORMAL HIGH (ref 4.0–10.5)
nRBC: 0 % (ref 0.0–0.2)

## 2020-11-09 MED ORDER — DOXYCYCLINE HYCLATE 100 MG IV SOLR
100.0000 mg | Freq: Two times a day (BID) | INTRAVENOUS | Status: AC
  Administered 2020-11-09 – 2020-11-12 (×7): 100 mg via INTRAVENOUS
  Filled 2020-11-09 (×9): qty 100

## 2020-11-09 MED ORDER — FUROSEMIDE 10 MG/ML IJ SOLN: 40.0000 mg | Freq: Every day | INTRAMUSCULAR | Status: DC

## 2020-11-09 MED ORDER — POTASSIUM CHLORIDE CRYS ER 20 MEQ PO TBCR
40.0000 meq | EXTENDED_RELEASE_TABLET | Freq: Once | ORAL | Status: AC
  Administered 2020-11-09: 40 meq via ORAL
  Filled 2020-11-09: qty 2

## 2020-11-09 MED ORDER — FUROSEMIDE 10 MG/ML IJ SOLN
40.0000 mg | Freq: Two times a day (BID) | INTRAMUSCULAR | Status: DC
  Administered 2020-11-09 – 2020-11-11 (×5): 40 mg via INTRAVENOUS
  Filled 2020-11-09 (×5): qty 4

## 2020-11-09 NOTE — Progress Notes (Signed)
Physical Therapy Treatment Patient Details Name: Sandra Mcconnell MRN: [id number] DOB: 1941/04/24 Today's Date: 11/09/2020    History of Present Illness presented to ER secondary to mechanical fall in home environment; admitted for management of reverse obliquity L proximal femur fracture, s/p ORIF (11/04/20), WBAT.  Orders noted for cervical collar with OOB activities (utilized for comfort due to chronic neck pain/weakness per patient/husband report).    PT Comments    Pt tolerated recliner 3 hours today.  She transfers back to bed with max a x 2/dependant with SW.  Pt needs increased time and heavy cues and encouragement.  She repeats instructions back to you and at times needs to go over instructions several times before moving "wait, wait, wait"  Fear is also a barrier.  LLE remains quite swollen at hip.   Pt continues to require heavy +2 assist for mobility and PT involvement for transfers.  Pt is being seen for split/BID sessions to assist with transfers given difficulty of care.  Hoyer lift would be reasonable for transfers with nursing staff but given pain/edema in L hip it would likely be more painful for her.    Neck brace is donned before and after OOB.   Follow Up Recommendations  SNF     Equipment Recommendations       Recommendations for Other Services       Precautions / Restrictions Precautions Precautions: Fall Required Braces or Orthoses: Cervical Brace Spinal Brace: Applied in sitting position Restrictions Weight Bearing Restrictions: Yes RLE Weight Bearing: Weight bearing as tolerated LLE Weight Bearing: Weight bearing as tolerated    Mobility  Bed Mobility Overal bed mobility: Needs Assistance Bed Mobility: Sit to Supine     Supine to sit: Max assist;+2 for physical assistance Sit to supine: +2 for physical assistance;Total assist      Transfers Overall transfer level: Needs assistance Equipment used: Standard walker Transfers: Sit to/from  Stand Sit to Stand: Mod assist;+2 physical assistance         General transfer comment: some increased assist today  Ambulation/Gait Ambulation/Gait assistance: Mod assist;+2 physical assistance;Max assist Gait Distance (Feet): 2 Feet Assistive device: Standard walker Gait Pattern/deviations: Step-to pattern;Decreased step length - right;Decreased step length - left;Decreased stance time - left Gait velocity: very slow   General Gait Details: poor quality steps back to bed   Stairs             Wheelchair Mobility    Modified Rankin (Stroke Patients Only)       Balance Overall balance assessment: Needs assistance Sitting-balance support: No upper extremity supported;Feet supported Sitting balance-Leahy Scale: Poor     Standing balance support: Bilateral upper extremity supported Standing balance-Leahy Scale: Poor Standing balance comment: able to stand for several minutes but needs pressure to hold walker down and correct right lean                            Cognition Arousal/Alertness: Awake/alert Behavior During Therapy: WFL for tasks assessed/performed Overall Cognitive Status: Within Functional Limits for tasks assessed                                 General Comments: repeats directions during exercises and transitions. - husband reports PTSD and needs clear instructions and communication/encouragement      Exercises      General Comments  Pertinent Vitals/Pain Pain Assessment: Faces Faces Pain Scale: Hurts whole lot Pain Location: L hip Pain Descriptors / Indicators: Aching;Grimacing;Guarding Pain Intervention(s): Limited activity within patient's tolerance;Monitored during session;Repositioned    Home Living                      Prior Function            PT Goals (current goals can now be found in the care plan section) Progress towards PT goals: Progressing toward goals    Frequency     7X/week      PT Plan Current plan remains appropriate    Co-evaluation              AM-PAC PT "6 Clicks" Mobility   Outcome Measure  Help needed turning from your back to your side while in a flat bed without using bedrails?: Total Help needed moving from lying on your back to sitting on the side of a flat bed without using bedrails?: Total Help needed moving to and from a bed to a chair (including a wheelchair)?: Total Help needed standing up from a chair using your arms (e.g., wheelchair or bedside chair)?: Total Help needed to walk in hospital room?: Total Help needed climbing 3-5 steps with a railing? : Total 6 Click Score: 6    End of Session Equipment Utilized During Treatment: Gait belt Activity Tolerance: Patient limited by pain Patient left: in chair;with call bell/phone within reach;with chair alarm set Nurse Communication: Mobility status PT Visit Diagnosis: Muscle weakness (generalized) (M62.81);Difficulty in walking, not elsewhere classified (R26.2)     Time: [id number] PT Time Calculation (min) (ACUTE ONLY): 23 min  Charges:  $Therapeutic Activity: 23-37 mins                    Chesley Noon, PTA 11/09/20, 2:33 PM

## 2020-11-09 NOTE — Progress Notes (Signed)
Physical Therapy Treatment Patient Details Name: Sandra Mcconnell MRN: [id number] DOB: 07/02/1941 Today's Date: 11/09/2020    History of Present Illness presented to ER secondary to mechanical fall in home environment; admitted for management of reverse obliquity L proximal femur fracture, s/p ORIF (11/04/20), WBAT.  Orders noted for cervical collar with OOB activities (utilized for comfort due to chronic neck pain/weakness per patient/husband report).    PT Comments    Pt feeling better, ready for session.  Pt is able to transfer OOB to recliner today with max a x 2 for most mobility.  She takes very slow steps needing lots of verbal cues and encouragement to complete task.  Extra time required.    Will return in PM to assist with back to bed given difficulty and heavy assist with transfer.   Follow Up Recommendations  SNF     Equipment Recommendations       Recommendations for Other Services       Precautions / Restrictions Precautions Precautions: Fall Required Braces or Orthoses: Cervical Brace Spinal Brace: Applied in sitting position Restrictions Weight Bearing Restrictions: Yes LLE Weight Bearing: Weight bearing as tolerated    Mobility  Bed Mobility Overal bed mobility: Needs Assistance Bed Mobility: Supine to Sit     Supine to sit: Max assist;+2 for physical assistance        Transfers Overall transfer level: Needs assistance Equipment used: Standard walker Transfers: Sit to/from Stand Sit to Stand: Mod assist;+2 physical assistance         General transfer comment: some increased assist today  Ambulation/Gait Ambulation/Gait assistance: Mod assist;+2 physical assistance;Max assist Gait Distance (Feet): 2 Feet Assistive device: Standard walker Gait Pattern/deviations: Step-to pattern;Decreased step length - right;Decreased step length - left;Decreased stance time - left Gait velocity: very slow   General Gait Details: poor quality steps to  recliner.   Stairs             Wheelchair Mobility    Modified Rankin (Stroke Patients Only)       Balance Overall balance assessment: Needs assistance Sitting-balance support: No upper extremity supported;Feet supported Sitting balance-Leahy Scale: Poor     Standing balance support: Bilateral upper extremity supported Standing balance-Leahy Scale: Poor Standing balance comment: able to stand for several minutes but needs pressure to hold walker down and correct right lean                            Cognition Arousal/Alertness: Awake/alert Behavior During Therapy: WFL for tasks assessed/performed Overall Cognitive Status: Within Functional Limits for tasks assessed                                 General Comments: repeats directions during exercises and transitions. - husband reports PTSD and needs clear instructions and communication/encouragement      Exercises      General Comments        Pertinent Vitals/Pain Pain Assessment: Faces Faces Pain Scale: Hurts whole lot Pain Location: L hip Pain Descriptors / Indicators: Aching;Grimacing;Guarding Pain Intervention(s): Limited activity within patient's tolerance;Monitored during session;Repositioned;Relaxation    Home Living                      Prior Function            PT Goals (current goals can now be found in the care plan section) Progress  towards PT goals: Progressing toward goals    Frequency    7X/week      PT Plan Current plan remains appropriate    Co-evaluation              AM-PAC PT "6 Clicks" Mobility   Outcome Measure  Help needed turning from your back to your side while in a flat bed without using bedrails?: Total Help needed moving from lying on your back to sitting on the side of a flat bed without using bedrails?: Total Help needed moving to and from a bed to a chair (including a wheelchair)?: Total Help needed standing up from a  chair using your arms (e.g., wheelchair or bedside chair)?: Total Help needed to walk in hospital room?: Total Help needed climbing 3-5 steps with a railing? : Total 6 Click Score: 6    End of Session Equipment Utilized During Treatment: Gait belt Activity Tolerance: Patient limited by pain Patient left: in chair;with call bell/phone within reach;with chair alarm set Nurse Communication: Mobility status PT Visit Diagnosis: Muscle weakness (generalized) (M62.81);Difficulty in walking, not elsewhere classified (R26.2)     Time: 7903-8333 PT Time Calculation (min) (ACUTE ONLY): 15 min  Charges:  $Therapeutic Activity: 8-22 mins                    Danielle Dess, PTA 11/09/20, 2:27 PM

## 2020-11-09 NOTE — Progress Notes (Deleted)
SATURATION QUALIFICATIONS: (This note is used to comply with regulatory documentation for home oxygen)  Patient Saturations on Room Air at Rest = 85  Patient Saturations on Room Air while Ambulating = N/A  Patient Saturations on 4 Liters of oxygen while Ambulating = 96  Please briefly explain why patient needs home oxygen: Patient desats when not on O2. Feels short of breath and has expiratory wheezes

## 2020-11-09 NOTE — Care Management Important Message (Signed)
Important Message  Patient Details  Name: Sandra Mcconnell MRN: [id number] Date of Birth: June 25, 1941   Medicare Important Message Given:  Yes     Olegario Messier A Leticia Mcdiarmid 11/09/2020, 11:00 AM

## 2020-11-09 NOTE — Progress Notes (Signed)
Subjective: 5 Days Post-Op Procedure(s) (LRB): INTRAMEDULLARY (IM) NAIL INTERTROCHANTRIC (Left) Patient reports pain as moderate to the left hip Patient started on doxycycline for CAP. Blood cultures have not returned at this time. Underwent repeat transfusion yesterday. We will continue therapy today.  Plan is to go [facility] at discharge, probably on Monday.  Objective: Vital signs in last 24 hours: Temp:  [99.2 F (37.3 C)-102.2 F (39 C)] 99.8 F (37.7 C) (01/03 0459) Pulse Rate:  [80-97] 88 (01/03 0459) Resp:  [17-19] 18 (01/02 1903) BP: (102-126)/(54-72) 117/60 (01/03 0459) SpO2:  [90 %-94 %] 92 % (01/03 0459)  Intake/Output from previous day: 01/02 0701 - 01/03 0700 In: -  Out: 600 [Urine:600] Intake/Output this shift: No intake/output data recorded.  Recent Labs    11/07/20 0549 11/08/20 0502 11/08/20 1940 11/09/20 0436  HGB 7.3* 6.8* 8.3* 8.2*   Recent Labs    11/08/20 0502 11/08/20 1940 11/09/20 0436  WBC 10.7*  --  11.5*  RBC 2.10*  --  2.56*  HCT 19.7* 23.8* 23.4*  PLT 146*  --  205   Recent Labs    11/08/20 0502 11/09/20 0436  NA 136 138  K 3.4* 3.3*  CL 104 105  CO2 24 25  BUN 11 11  CREATININE 0.63 0.58  GLUCOSE 104* 108*  CALCIUM 7.6* 7.5*   No results for input(s): LABPT, INR in the last 72 hours.  EXAM General - Patient is Alert, Appropriate and Oriented Extremity - Neurovascular intact Sensation intact distally Intact pulses distally Dorsiflexion/Plantar flexion intact Incision: moderate drainage No cellulitis present Compartment soft  No erythema to the left hip. Dressing -  Serosanguinous drainage to the left hip, new honeycomb dressing applied to the left hip this AM. Motor Function - intact, moving foot and toes well on exam.   Past Medical History:  Diagnosis Date  . Anemia   . Anemia 08/04/2017  . Anemia 08/04/2017  . Anxiety   . Carpal tunnel syndrome   . Carpal tunnel syndrome   . Cataract cortical, senile    . 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 Chicken pox   . Colitis   . Complication of anesthesia   . Depression   . Dizziness   . DJD (degenerative joint disease)   . Dysphagia   . GERD (gastroesophageal reflux disease)   . Hemorrhoids   . History of hiatal hernia   . History of palpitations    EMOTIONAL PALPITATIONS  . IBS (irritable bowel syndrome)   . Iritis   . Migraines   . Neuropathy   . OCD (obsessive compulsive disorder)   . Osteoarthritis   . Osteoporosis   . Panic attacks   . PONV (postoperative nausea and vomiting)   . Psoriasis   . Psoriasis   . Redundant colon   . REM sleep behavior disorder   . Sleep apnea   . Sleep disorder    REM SLEEP DISORDER  . Slow transit constipation   . Spondylosis    CERVICAL,SLEEPS WITH HOB ELEVATED AND WEARS NECK SUPPORT  . Spondylosis of cervical joint   . Spondylosis of cervical joint     Assessment/Plan:   5 Days Post-Op Procedure(s) (LRB): INTRAMEDULLARY (IM) NAIL INTERTROCHANTRIC (Left) Active Problems:   Hip fracture (HCC)  Estimated body mass index is 27.47 kg/m as calculated from the following:   Height as of this encounter: 5' (1.524 m).   Weight as of this encounter: 63.8 kg. Advance diet Up with therapy  Work on [employer], move to [employer] today. Hg 8.2 after second transfusion yesterday. Patient has been started on doxycycline for presume CAP.  Blood cultures and urine culture have not returned. Encouraged continued incentive spirometer. Continue with PT today.  DVT Prophylaxis - Lovenox, Foot Pumps and TED hose Weight-Bearing as tolerated to left leg  J. Horris Latino, PA-C [facility] 11/09/2020, 8:01 AM

## 2020-11-09 NOTE — Progress Notes (Signed)
PROGRESS NOTE    ARIEAL CUOCO  GEX:[id number] DOB: 03-11-1941 DOA: 11/03/2020 PCP: Dorothey Baseman, MD   Assessment & Plan:   Active Problems:   Hip fracture (HCC)   Left hip fracture: s/p mechanical fall. S/p intramedullary nail intertrochanteric & open reduction on 11/04/20 as per ortho surg. Oxycodone, dilaudid prn for pain. PT recs SNF.   Fever: etiology unclear. Blood cx & urine cxs ordered. CXR: shows vascular congestion and cannot exclude pneumonia. Start on IV doxycycline as allergies to penicillins & flouroquinolones. Started on IV lasix   Hypokalemia: KCL repleted.   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 Restart home dose of aspirin when ok with ortho surg   Depression/PTSD: severity unknown. 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 Continue on home dose of tizandidine, gabapentin.  GERD: continue on PPI   Normocytic anemia: w/ acute post op anemia. S/p 2 units of pRBCs transfused. Will continue to monitor  DVT prophylaxis: heparin Code Status: full  Family Communication: discussed pt's care w/ pt's family at bedside and answered their questions  Disposition Plan: likely d/c to SNF   Status is: Inpatient  Remains inpatient appropriate because:Ongoing diagnostic testing needed not appropriate for outpatient work up, Unsafe d/c plan and IV treatments appropriate due to intensity of illness or inability to take PO, waiting on SNF placement    Dispo: The patient is from: Home              Anticipated d/c is to: SNF              Anticipated d/c date is: 3 days              Patient currently is not medically stable to d/c.        Consultants:   Ortho surg    Procedures:    Antimicrobials:   Subjective: Pt c/o pain   Objective: Vitals:   11/08/20 1725 11/08/20 1903  11/08/20 2238 11/09/20 0459  BP: 126/66 (!) 102/59 (!) 118/55 117/60  Pulse: 97 81 93 88  Resp: 18 18    Temp: (!) 101.4 F (38.6 C) 100.3 F (37.9 C) 99.3 F (37.4 C) 99.8 F (37.7 C)  TempSrc:      SpO2: 90% 91%  92%  Weight:      Height:        Intake/Output Summary (Last 24 hours) at 11/09/2020 0737 Last data filed at 11/09/2020 0118 Gross per 24 hour  Intake -  Output 600 ml  Net -600 ml   Filed Weights   11/04/20 0944  Weight: 63.8 kg    Examination:  General exam: Appears uncomfortable  Respiratory system: decreased breath sounds b/l.  Cardiovascular system: S1 & S2+. No gallops or rubs  Gastrointestinal system: Abd is soft, NT, ND & hypoactive bowel sounds  Central nervous system: Alert and awake. Moves all 4 extremities  Psychiatry: Judgement and insight appears abnormal. Flat mood and affect     Data Reviewed: I have personally reviewed following labs and imaging studies  CBC: Recent Labs  Lab 11/05/20 0416 11/05/20 1432 11/06/20 0504 11/07/20 0549 11/08/20 0502 11/08/20 1940 11/09/20 0436  WBC 9.1  --  9.9 11.2* 10.7*  --  11.5*  HGB 7.9*   < > 8.3* 7.3* 6.8* 8.3* 8.2*  HCT 21.7*   < >  22.8* 20.4* 19.7* 23.8* 23.4*  MCV 93.1  --  89.8 90.3 93.8  --  91.4  PLT 111*  --  123* 141* 146*  --  205   < > = values in this interval not displayed.   Basic Metabolic Panel: Recent Labs  Lab 11/05/20 0416 11/06/20 0504 11/07/20 0549 11/08/20 0502 11/09/20 0436  NA 137 137 136 136 138  K 3.7 3.1* 3.5 3.4* 3.3*  CL 104 105 103 104 105  CO2 26 25 25 24 25   GLUCOSE 158* 131* 114* 104* 108*  BUN 18 12 10 11 11   CREATININE 0.83 0.57 0.51 0.63 0.58  CALCIUM 7.8* 7.5* 7.5* 7.6* 7.5*   GFR: Estimated Creatinine Clearance: 46 mL/min (by C-G formula based on SCr of 0.58 mg/dL). Liver Function Tests: No results for input(s): AST, ALT, ALKPHOS, BILITOT, PROT, ALBUMIN in the last 168 hours. No results for input(s): LIPASE, AMYLASE in the last 168 hours. No  results for input(s): AMMONIA in the last 168 hours. Coagulation Profile: Recent Labs  Lab 11/03/20 1142  INR 0.9   Cardiac Enzymes: No results for input(s): CKTOTAL, CKMB, CKMBINDEX, TROPONINI in the last 168 hours. BNP (last 3 results) No results for input(s): PROBNP in the last 8760 hours. HbA1C: No results for input(s): HGBA1C in the last 72 hours. CBG: No results for input(s): GLUCAP in the last 168 hours. Lipid Profile: No results for input(s): CHOL, HDL, LDLCALC, TRIG, CHOLHDL, LDLDIRECT in the last 72 hours. Thyroid Function Tests: No results for input(s): TSH, T4TOTAL, FREET4, T3FREE, THYROIDAB in the last 72 hours. Anemia Panel: No results for input(s): VITAMINB12, FOLATE, FERRITIN, TIBC, IRON, RETICCTPCT in the last 72 hours. Sepsis Labs: No results for input(s): PROCALCITON, LATICACIDVEN in the last 168 hours.  Recent Results (from the past 240 hour(s))  Resp Panel by RT-PCR (Flu A&B, Covid) Nasopharyngeal Swab     Status: None   Collection Time: 11/03/20  2:43 PM   Specimen: Nasopharyngeal Swab; Nasopharyngeal(NP) swabs in vial transport medium  Result Value Ref Range Status   SARS Coronavirus 2 by RT PCR NEGATIVE NEGATIVE Final    Comment: (NOTE) SARS-CoV-2 target nucleic acids are NOT DETECTED.  The SARS-CoV-2 RNA is generally detectable in upper respiratory specimens during the acute phase of infection. The lowest concentration of SARS-CoV-2 viral copies this assay can detect is 138 copies/mL. A negative result does not preclude SARS-Cov-2 infection and should not be used as the sole basis for treatment or other patient management decisions. A negative result may occur with  improper specimen collection/handling, submission of specimen other than nasopharyngeal swab, presence of viral mutation(s) within the areas targeted by this assay, and inadequate number of viral copies(<138 copies/mL). A negative result must be combined with clinical observations,  patient history, and epidemiological information. The expected result is Negative.  Fact Sheet for Patients:  EntrepreneurPulse.com.au  Fact Sheet for Healthcare Providers:  IncredibleEmployment.be  This test is no t yet approved or cleared by the [place] FDA and  has been authorized for detection and/or diagnosis of SARS-CoV-2 by FDA under an Emergency Use Authorization (EUA). This EUA will remain  in effect (meaning this test can be used) for the duration of the COVID-19 declaration under Section 564(b)(1) of the Act, 21 U.S.C.section 360bbb-3(b)(1), unless the authorization is terminated  or revoked sooner.       Influenza A by PCR NEGATIVE NEGATIVE Final   Influenza B by PCR NEGATIVE NEGATIVE Final    Comment: (NOTE) The Xpert  Xpress SARS-CoV-2/FLU/RSV plus assay is intended as an aid in the diagnosis of influenza from Nasopharyngeal swab specimens and should not be used as a sole basis for treatment. Nasal washings and aspirates are unacceptable for Xpert Xpress SARS-CoV-2/FLU/RSV testing.  Fact Sheet for Patients: EntrepreneurPulse.com.au  Fact Sheet for Healthcare Providers: IncredibleEmployment.be  This test is not yet approved or cleared by the [place] FDA and has been authorized for detection and/or diagnosis of SARS-CoV-2 by FDA under an Emergency Use Authorization (EUA). This EUA will remain in effect (meaning this test can be used) for the duration of the COVID-19 declaration under Section 564(b)(1) of the Act, 21 U.S.C. section 360bbb-3(b)(1), unless the authorization is terminated or revoked.  Performed at [facility], [address], [place], [place] [address]          Radiology Studies: DG Chest 2 View  Result Date: 11/08/2020 CLINICAL DATA:  Fever, postop EXAM: CHEST - 2 VIEW COMPARISON:  11/03/2020 FINDINGS: Decreasing lung volumes with increasing  bibasilar opacities. Small bilateral layering effusions. Mild cardiomegaly and vascular congestion. No acute bony abnormality. IMPRESSION: Cardiomegaly, vascular congestion. Small layering bilateral effusions with bibasilar atelectasis or infiltrates. Electronically Signed   By: Rolm Baptise M.D.   On: 11/08/2020 11:32        Scheduled Meds: . sodium chloride   Intravenous Once  . sodium chloride   Intravenous Once  . acidophilus  1 capsule Oral Q1500  . buprenorphine  1 patch Transdermal Q Mon  . Chlorhexidine Gluconate Cloth  6 each Topical Q0600  . enoxaparin (LOVENOX) injection  40 mg Subcutaneous Q24H  . feeding supplement  237 mL Oral BID BM  . ferrous [id number] C-folic acid  1 capsule Oral BID  . furosemide  40 mg Intravenous BID  . gabapentin  800 mg Oral TID  . midodrine  10 mg Oral TID  . multivitamin with minerals  1 tablet Oral Daily  . oxyCODONE  15 mg Oral Q12H  . pantoprazole  40 mg Oral Daily  . polyethylene glycol  17 g Oral Daily  . potassium chloride  40 mEq Oral Once  . prazosin  2 mg Oral TID  . QUEtiapine  25 mg Oral QHS  . rOPINIRole  1 mg Oral QHS  . senna  1 tablet Oral BID  . sertraline  100 mg Oral BID  . tiZANidine  1 mg Oral QID   Continuous Infusions: . sodium chloride 100 mL/hr at 11/09/20 0323  .  ceFAZolin (ANCEF) IV    . doxycycline (VIBRAMYCIN) IV    . methocarbamol (ROBAXIN) IV       LOS: 6 days    Time spent: 35 mins     Wyvonnia Dusky, MD [facility] Pager [phone]  If 7PM-7AM, please contact night-coverage 11/09/2020, 7:37 AM

## 2020-11-09 NOTE — TOC Initial Note (Signed)
Transition of Care [facility]) - Initial/Assessment Note    Patient Details  Name: Sandra Mcconnell MRN: [id number] Date of Birth: [birth year]  Transition of Care [facility]) CM/SW Contact:    Shelbie Ammons, RN Phone Number: 11/09/2020, 1:28 PM  Clinical Narrative:    RNCM met with patient and husband in room. Patient sitting up in recliner and reports to still feeling rather poorly. Husband reports she was still running a fever this morning. Patient and husband live at [facility] and it will be there plan for her to go to SNF there when ready for discharge. Husband asking majority of questions which were answered to their approval. No other needs identified at this time. RNCM will remain available and assist in discharge to facility when patient is ready.              Expected Discharge Plan: Skilled Nursing Facility Barriers to Discharge: Continued Medical Work up   Patient Goals and CMS Choice        Expected Discharge Plan and Services Expected Discharge Plan: [place]       Living arrangements for the past 2 months: Single Family Home                                      Prior Living Arrangements/Services Living arrangements for the past 2 months: Single Family Home Lives with:: Spouse Patient language and need for interpreter reviewed:: Yes Do you feel safe going back to the place where you live?: Yes      Need for Family Participation in Patient Care: Yes (Comment) Care giver support system in place?: Yes (comment)   Criminal Activity/Legal Involvement Pertinent to Current Situation/Hospitalization: No - Comment as needed  Activities of Daily Living Home Assistive Devices/Equipment: None ADL Screening (condition at time of admission) Patient's cognitive ability adequate to safely complete daily activities?: Yes Is the patient deaf or have difficulty hearing?: No Does the patient have difficulty seeing, even when wearing glasses/contacts?: No Does the  patient have difficulty concentrating, remembering, or making decisions?: No Patient able to express need for assistance with ADLs?: Yes Does the patient have difficulty dressing or bathing?: Yes Independently performs ADLs?: No Does the patient have difficulty walking or climbing stairs?: Yes Weakness of Legs: Left Weakness of Arms/Hands: None  Permission Sought/Granted         Permission granted to share info w AGENCY: [facility]        Emotional Assessment Appearance:: Appears stated age Attitude/Demeanor/Rapport: Engaged Affect (typically observed): Appropriate,Calm Orientation: : Oriented to Self,Oriented to Place,Oriented to  Time,Oriented to Situation Alcohol / Substance Use: Not Applicable Psych Involvement: No (comment)  Admission diagnosis:  Hip fracture ([place]) [S72.009A] Closed fracture of left hip, initial encounter ([place]) [S72.002A] Patient Active Problem List   Diagnosis Date Noted  . Hip fracture ([place]) 11/03/2020  . Lactose intolerance 11/30/2017  . Slow transit constipation 08/04/2017  . 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 Restless leg syndrome 02/20/2015  . OA (osteoarthritis) 07/25/2014  . Anxiety 05/14/2014  . Depression 05/14/2014  . GERD (gastroesophageal reflux disease) 05/14/2014  . 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 Headache 03/14/2014  . Other and unspecified hyperlipidemia 03/14/2014  . 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 Urinary system disease 06/28/2013  . 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 FOM (frequency of micturition) 06/28/2013  . 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 Sleep apnea 06/18/2012   PCP:  Juluis Pitch, MD Pharmacy:   [place], [place] [address] [place] [address] Phone: [phone] Fax: [phone]     Social Determinants of Health (SDOH) Interventions    Readmission Risk  Interventions No flowsheet data found.

## 2020-11-10 DIAGNOSIS — G894 Chronic pain syndrome: Secondary | ICD-10-CM | POA: Diagnosis not present

## 2020-11-10 DIAGNOSIS — S72002A Fracture of unspecified part of neck of left femur, initial encounter for closed fracture: Secondary | ICD-10-CM | POA: Diagnosis not present

## 2020-11-10 DIAGNOSIS — E876 Hypokalemia: Secondary | ICD-10-CM | POA: Diagnosis not present

## 2020-11-10 LAB — BASIC METABOLIC PANEL
Anion gap: 12 (ref 5–15)
BUN: 11 mg/dL (ref 8–23)
CO2: 28 mmol/L (ref 22–32)
Calcium: 7.7 mg/dL — ABNORMAL LOW (ref 8.9–10.3)
Chloride: 98 mmol/L (ref 98–111)
Creatinine, Ser: 0.54 mg/dL (ref 0.44–1.00)
GFR, Estimated: >60 mL/min (ref >60)
Glucose, Bld: 113 mg/dL — ABNORMAL HIGH (ref 70–99)
Potassium: 2.6 mmol/L — CL (ref 3.5–5.1)
Sodium: 138 mmol/L (ref 135–145)

## 2020-11-10 LAB — URINE CULTURE: Culture: <10000 — AB

## 2020-11-10 LAB — CBC
HCT: 24.8 % — ABNORMAL LOW (ref 36.0–46.0)
Hemoglobin: 8.9 g/dL — ABNORMAL LOW (ref 12.0–15.0)
MCH: 32.4 pg (ref 26.0–34.0)
MCHC: 35.9 g/dL (ref 30.0–36.0)
MCV: 90.2 fL (ref 80.0–100.0)
Platelets: 283 10*3/uL (ref 150–400)
RBC: 2.75 MIL/uL — ABNORMAL LOW (ref 3.87–5.11)
RDW: 12.8 % (ref 11.5–15.5)
WBC: 13.4 10*3/uL — ABNORMAL HIGH (ref 4.0–10.5)
nRBC: 0 % (ref 0.0–0.2)

## 2020-11-10 LAB — SARS CORONAVIRUS 2 (TAT 6-24 HRS): SARS Coronavirus 2: NEGATIVE

## 2020-11-10 LAB — MAGNESIUM: Magnesium: 1.6 mg/dL — ABNORMAL LOW (ref 1.7–2.4)

## 2020-11-10 MED ORDER — ACETAMINOPHEN 500 MG PO TABS
1000.0000 mg | ORAL_TABLET | Freq: Four times a day (QID) | ORAL | Status: DC
  Administered 2020-11-10 – 2020-11-12 (×7): 1000 mg via ORAL
  Filled 2020-11-10 (×8): qty 2

## 2020-11-10 MED ORDER — MAGNESIUM SULFATE 2 GM/50ML IV SOLN
2.0000 g | Freq: Once | INTRAVENOUS | Status: AC
  Administered 2020-11-10: 2 g via INTRAVENOUS
  Filled 2020-11-10: qty 50

## 2020-11-10 MED ORDER — POTASSIUM CHLORIDE CRYS ER 20 MEQ PO TBCR
40.0000 meq | EXTENDED_RELEASE_TABLET | Freq: Two times a day (BID) | ORAL | Status: AC
  Administered 2020-11-10 (×2): 40 meq via ORAL
  Filled 2020-11-10 (×2): qty 2

## 2020-11-10 NOTE — Progress Notes (Signed)
Subjective:  6 Days Post-Op Procedure(s) (LRB): INTRAMEDULLARY (IM) NAIL INTERTROCHANTRIC (Left) Patient reports pain as moderate to the left hip. Requesting scheduled Tylenol for better pain control. Temperature improving, 99.5 today. We will continue therapy today.  Plan is to go [facility] at discharge, likely Wednesday.  Objective: Vital signs in last 24 hours: Temp:  [96.7 F (35.9 C)-100.3 F (37.9 C)] 99.5 F (37.5 C) (01/04 1145) Pulse Rate:  [80-88] 80 (01/04 1145) Resp:  [16-18] 16 (01/04 1145) BP: (103-124)/(56-68) 114/63 (01/04 1145) SpO2:  [86 %-95 %] 92 % (01/04 1145)  Intake/Output from previous day: 01/03 0701 - 01/04 0700 In: 6285.1 [I.V.:6035.1; IV Piggyback:250] Out: 3000 [Urine:3000] Intake/Output this shift: Total I/O In: -  Out: 1200 [Urine:1200]  Recent Labs    11/08/20 0502 11/08/20 1940 11/09/20 0436 11/10/20 0453  HGB 6.8* 8.3* 8.2* 8.9*   Recent Labs    11/09/20 0436 11/10/20 0453  WBC 11.5* 13.4*  RBC 2.56* 2.75*  HCT 23.4* 24.8*  PLT 205 283   Recent Labs    11/09/20 0436 11/10/20 0453  NA 138 138  K 3.3* 2.6*  CL 105 98  CO2 25 28  BUN 11 11  CREATININE 0.58 0.54  GLUCOSE 108* 113*  CALCIUM 7.5* 7.7*   No results for input(s): LABPT, INR in the last 72 hours.  EXAM General - Patient is Alert, Appropriate and Oriented Extremity - Neurovascular intact Sensation intact distally Intact pulses distally Dorsiflexion/Plantar flexion intact Incision: moderate drainage No cellulitis present Compartment soft  No erythema to the left hip. Dressing -mild serosanguinous drainage to the left hip, Motor Function - intact, moving foot and toes well on exam.   Past Medical History:  Diagnosis Date  . Anemia   . Anemia 08/04/2017  . Anemia 08/04/2017  . Anxiety   . Carpal tunnel syndrome   . Carpal tunnel syndrome   . Cataract cortical, senile   . Cervical spinal cord compression ([place])   . Chicken pox   . Colitis   .  Complication of anesthesia   . Depression   . Dizziness   . DJD (degenerative joint disease)   . Dysphagia   . GERD (gastroesophageal reflux disease)   . Hemorrhoids   . History of hiatal hernia   . History of palpitations    EMOTIONAL PALPITATIONS  . IBS (irritable bowel syndrome)   . Iritis   . Migraines   . Neuropathy   . OCD (obsessive compulsive disorder)   . Osteoarthritis   . Osteoporosis   . Panic attacks   . PONV (postoperative nausea and vomiting)   . Psoriasis   . Psoriasis   . Redundant colon   . REM sleep behavior disorder   . Sleep apnea   . Sleep disorder    REM SLEEP DISORDER  . Slow transit constipation   . Spondylosis    CERVICAL,SLEEPS WITH HOB ELEVATED AND WEARS NECK SUPPORT  . Spondylosis of cervical joint   . Spondylosis of cervical joint     Assessment/Plan:   6 Days Post-Op Procedure(s) (LRB): INTRAMEDULLARY (IM) NAIL INTERTROCHANTRIC (Left) Active Problems:   Hip fracture (HCC)  Estimated body mass index is 27.47 kg/m as calculated from the following:   Height as of this encounter: 5' (1.524 m).   Weight as of this encounter: 63.8 kg. Advance diet Up with therapy  Continue to work on bowel movement. Mag citrate given today. Hg stable -continue with iron supplement Continue with antibiotics. Continue incentive spirometer. Continue  with PT today. Care management to assist with discharge to [facility]  Follow-up with [facility] orthopedics in 2 weeks for staple removal and Steri-Strip application Lovenox 40 mg subcu daily at discharge x14 days  DVT Prophylaxis - Lovenox, Foot Pumps and TED hose Weight-Bearing as tolerated to left leg  T. Cranston Neighbor, PA-C [facility] [place] [facility] 11/10/2020, 12:15 PM

## 2020-11-10 NOTE — Progress Notes (Signed)
PROGRESS NOTE    Sandra Mcconnell  [id number] DOB: August 04, 1941 DOA: 11/03/2020 PCP: Juluis Pitch, MD   Assessment & Plan:   Active Problems:   Hip fracture (HCC)   Left hip fracture: s/p mechanical fall. S/p intramedullary nail intertrochanteric & open reduction on 11/04/20 as per ortho surg. Oxycodone, dilaudid prn for pain. PT recs SNF.   Fever: etiology unclear. Blood cx & urine cxs NGTD. CXR: shows vascular congestion and cannot exclude pneumonia. Continue on IV doxycycline as allergies to penicillins & flouroquinolones. Continue on IV lasix. Will need to be afebrile for 24 hours prior to d/c.   Hypokalemia: KCl repleted. Will continue to monitor    Hypomagnesemia: mg sulfate ordered. 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 Restart home dose of aspirin when ok with ortho surg   Depression/PTSD: severity unknown. 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 Continue on home dose of tizandidine, gabapentin.  GERD: continue on PPI   Normocytic anemia: w/ acute post op anemia. S/p 2 units of pRBCs transfused. H&H are trending up today   DVT prophylaxis: heparin Code Status: full  Family Communication: discussed pt's care w/ pt's family at bedside and answered their questions  Disposition Plan: likely d/c to SNF. Needs to be afebrile for 24 hours prior to d/c   Status is: Inpatient  Remains inpatient appropriate because:Ongoing diagnostic testing needed not appropriate for outpatient work up, Unsafe d/c plan and IV treatments appropriate due to intensity of illness or inability to take PO, waiting on SNF placement    Dispo: The patient is from: Home              Anticipated d/c is to: SNF              Anticipated d/c date is: 3 days              Patient currently is not  medically stable to d/c. Can be d/c if pt is afebrile for 24 hours        Consultants:   Ortho surg    Procedures:    Antimicrobials:   Subjective: Pt c/o fatigue   Objective: Vitals:   11/09/20 1550 11/09/20 2053 11/10/20 0048 11/10/20 0458  BP: (!) 103/56 (!) 117/57 (!) 107/57 124/66  Pulse: 80 82 84 88  Resp: 18 16 16 16   Temp: 100.3 F (37.9 C) 99.5 F (37.5 C) 97.6 F (36.4 C) (!) 96.7 F (35.9 C)  TempSrc:      SpO2: 91% (!) 88% (!) 86% 92%  Weight:      Height:        Intake/Output Summary (Last 24 hours) at 11/10/2020 0715 Last data filed at 11/09/2020 2216 Gross per 24 hour  Intake 6285.11 ml  Output 3000 ml  Net 3285.11 ml   Filed Weights   11/04/20 0944  Weight: 63.8 kg    Examination:  General exam: Appears calm & comfortable  Respiratory system: diminished breath sounds b/l  Cardiovascular system: S1/S2+. No rubs or clicks  Gastrointestinal system: Abd is soft, NT, distended, & hypoactive bowel sounds  Central nervous system: Alert and awake. Moves all 4 extremities  Psychiatry: Judgement and insight appears abnormal. Flat mood and affect     Data Reviewed: I have personally reviewed following  labs and imaging studies  CBC: Recent Labs  Lab 11/06/20 0504 11/07/20 0549 11/08/20 0502 11/08/20 1940 11/09/20 0436 11/10/20 0453  WBC 9.9 11.2* 10.7*  --  11.5* 13.4*  HGB 8.3* 7.3* 6.8* 8.3* 8.2* 8.9*  HCT 22.8* 20.4* 19.7* 23.8* 23.4* 24.8*  MCV 89.8 90.3 93.8  --  91.4 90.2  PLT 123* 141* 146*  --  205 [id number]   Basic Metabolic Panel: Recent Labs  Lab 11/06/20 0504 11/07/20 0549 11/08/20 0502 11/09/20 0436 11/10/20 0453  NA 137 136 136 138 138  K 3.1* 3.5 3.4* 3.3* 2.6*  CL 105 103 104 105 98  CO2 25 25 24 25 28   GLUCOSE 131* 114* 104* 108* 113*  BUN 12 10 11 11 11   CREATININE 0.57 0.51 0.63 0.58 0.54  CALCIUM 7.5* 7.5* 7.6* 7.5* 7.7*   GFR: Estimated Creatinine Clearance: 46 mL/min (by C-G formula based on SCr of 0.54  mg/dL). Liver Function Tests: No results for input(s): AST, ALT, ALKPHOS, BILITOT, PROT, ALBUMIN in the last 168 hours. No results for input(s): LIPASE, AMYLASE in the last 168 hours. No results for input(s): AMMONIA in the last 168 hours. Coagulation Profile: Recent Labs  Lab 11/03/20 1142  INR 0.9   Cardiac Enzymes: No results for input(s): CKTOTAL, CKMB, CKMBINDEX, TROPONINI in the last 168 hours. BNP (last 3 results) No results for input(s): PROBNP in the last 8760 hours. HbA1C: No results for input(s): HGBA1C in the last 72 hours. CBG: No results for input(s): GLUCAP in the last 168 hours. Lipid Profile: No results for input(s): CHOL, HDL, LDLCALC, TRIG, CHOLHDL, LDLDIRECT in the last 72 hours. Thyroid Function Tests: No results for input(s): TSH, T4TOTAL, FREET4, T3FREE, THYROIDAB in the last 72 hours. Anemia Panel: No results for input(s): VITAMINB12, FOLATE, FERRITIN, TIBC, IRON, RETICCTPCT in the last 72 hours. Sepsis Labs: No results for input(s): PROCALCITON, LATICACIDVEN in the last 168 hours.  Recent Results (from the past 240 hour(s))  Resp Panel by RT-PCR (Flu A&B, Covid) Nasopharyngeal Swab     Status: None   Collection Time: 11/03/20  2:43 PM   Specimen: Nasopharyngeal Swab; Nasopharyngeal(NP) swabs in vial transport medium  Result Value Ref Range Status   SARS Coronavirus 2 by RT PCR NEGATIVE NEGATIVE Final    Comment: (NOTE) SARS-CoV-2 target nucleic acids are NOT DETECTED.  The SARS-CoV-2 RNA is generally detectable in upper respiratory specimens during the acute phase of infection. The lowest concentration of SARS-CoV-2 viral copies this assay can detect is 138 copies/mL. A negative result does not preclude SARS-Cov-2 infection and should not be used as the sole basis for treatment or other patient management decisions. A negative result may occur with  improper specimen collection/handling, submission of specimen other than nasopharyngeal swab,  presence of viral mutation(s) within the areas targeted by this assay, and inadequate number of viral copies(<138 copies/mL). A negative result must be combined with clinical observations, patient history, and epidemiological information. The expected result is Negative.  Fact Sheet for Patients:  EntrepreneurPulse.com.au  Fact Sheet for Healthcare Providers:  IncredibleEmployment.be  This test is no t yet approved or cleared by the [place] FDA and  has been authorized for detection and/or diagnosis of SARS-CoV-2 by FDA under an Emergency Use Authorization (EUA). This EUA will remain  in effect (meaning this test can be used) for the duration of the COVID-19 declaration under Section 564(b)(1) of the Act, 21 U.S.C.section 360bbb-3(b)(1), unless the authorization is terminated  or revoked sooner.  Influenza A by PCR NEGATIVE NEGATIVE Final   Influenza B by PCR NEGATIVE NEGATIVE Final    Comment: (NOTE) The Xpert Xpress SARS-CoV-2/FLU/RSV plus assay is intended as an aid in the diagnosis of influenza from Nasopharyngeal swab specimens and should not be used as a sole basis for treatment. Nasal washings and aspirates are unacceptable for Xpert Xpress SARS-CoV-2/FLU/RSV testing.  Fact Sheet for Patients: EntrepreneurPulse.com.au  Fact Sheet for Healthcare Providers: IncredibleEmployment.be  This test is not yet approved or cleared by the [place] FDA and has been authorized for detection and/or diagnosis of SARS-CoV-2 by FDA under an Emergency Use Authorization (EUA). This EUA will remain in effect (meaning this test can be used) for the duration of the COVID-19 declaration under Section 564(b)(1) of the Act, 21 U.S.C. section 360bbb-3(b)(1), unless the authorization is terminated or revoked.  Performed at [facility], [address] [place], [place] [address]   CULTURE, BLOOD  (ROUTINE X 2) w Reflex to ID Panel     Status: None (Preliminary result)   Collection Time: 11/08/20 10:08 AM   Specimen: BLOOD  Result Value Ref Range Status   Specimen Description BLOOD LEFT HAND  Final   Special Requests   Final    BOTTLES DRAWN AEROBIC AND ANAEROBIC Blood Culture adequate volume   Culture   Final    NO GROWTH 2 DAYS Performed at [facility], [address], [place], [place] [address]    Report Status PENDING  Incomplete  CULTURE, BLOOD (ROUTINE X 2) w Reflex to ID Panel     Status: None (Preliminary result)   Collection Time: 11/08/20 10:13 AM   Specimen: BLOOD  Result Value Ref Range Status   Specimen Description BLOOD LEFT FA  Final   Special Requests   Final    BOTTLES DRAWN AEROBIC AND ANAEROBIC Blood Culture adequate volume   Culture   Final    NO GROWTH 2 DAYS Performed at [facility], [address] [place], [place] [address]    Report Status PENDING  Incomplete         Radiology Studies: DG Chest 2 View  Result Date: 11/08/2020 CLINICAL DATA:  Fever, postop EXAM: CHEST - 2 VIEW COMPARISON:  11/03/2020 FINDINGS: Decreasing lung volumes with increasing bibasilar opacities. Small bilateral layering effusions. Mild cardiomegaly and vascular congestion. No acute bony abnormality. IMPRESSION: Cardiomegaly, vascular congestion. Small layering bilateral effusions with bibasilar atelectasis or infiltrates. Electronically Signed   By: Rolm Baptise M.D.   On: 11/08/2020 11:32        Scheduled Meds: . sodium chloride   Intravenous Once  . sodium chloride   Intravenous Once  . acidophilus  1 capsule Oral Q1500  . buprenorphine  1 patch Transdermal Q Mon  . Chlorhexidine Gluconate Cloth  6 each Topical Q0600  . enoxaparin (LOVENOX) injection  40 mg Subcutaneous Q24H  . feeding supplement  237 mL Oral BID BM  . ferrous Q000111Q C-folic acid  1 capsule Oral BID  . furosemide  40 mg Intravenous BID  . gabapentin  800 mg  Oral TID  . midodrine  10 mg Oral TID  . multivitamin with minerals  1 tablet Oral Daily  . oxyCODONE  15 mg Oral Q12H  . pantoprazole  40 mg Oral Daily  . polyethylene glycol  17 g Oral Daily  . potassium chloride  40 mEq Oral BID  . prazosin  2 mg Oral TID  . QUEtiapine  25 mg Oral QHS  .  rOPINIRole  1 mg Oral QHS  . senna  1 tablet Oral BID  . sertraline  100 mg Oral BID  . tiZANidine  1 mg Oral QID   Continuous Infusions: . sodium chloride 100 mL/hr at 11/09/20 0323  .  ceFAZolin (ANCEF) IV    . doxycycline (VIBRAMYCIN) IV 100 mg (11/09/20 2157)  . methocarbamol (ROBAXIN) IV       LOS: 7 days    Time spent: 31 mins     Charise Killian, MD [facility] Pager [phone]  If 7PM-7AM, please contact night-coverage 11/10/2020, 7:15 AM

## 2020-11-10 NOTE — Progress Notes (Signed)
PT Cancellation Note  Patient Details Name: Sandra Mcconnell MRN: [id number] DOB: 12-22-1940   Cancelled Treatment:     PT attempt. 3 x this afternoon. Pt has been dealing with frequent loose stools. Upon entry to room, pt has another BM. Pt requested to hold PT this afternoon. Will return tomorrow and continue to follow per POC.     Rushie Chestnut 11/10/2020, 4:16 PM

## 2020-11-11 DIAGNOSIS — S72002A Fracture of unspecified part of neck of left femur, initial encounter for closed fracture: Secondary | ICD-10-CM

## 2020-11-11 LAB — URINALYSIS, COMPLETE (UACMP) WITH MICROSCOPIC
Bacteria, UA: NONE SEEN
Bilirubin Urine: NEGATIVE
Glucose, UA: NEGATIVE mg/dL
Hgb urine dipstick: NEGATIVE
Ketones, ur: NEGATIVE mg/dL
Leukocytes,Ua: NEGATIVE
Nitrite: NEGATIVE
Protein, ur: NEGATIVE mg/dL
Specific Gravity, Urine: 1.011 (ref 1.005–1.030)
pH: 7 (ref 5.0–8.0)

## 2020-11-11 LAB — CBC
HCT: 24.9 % — ABNORMAL LOW (ref 36.0–46.0)
Hemoglobin: 8.7 g/dL — ABNORMAL LOW (ref 12.0–15.0)
MCH: 32 pg (ref 26.0–34.0)
MCHC: 34.9 g/dL (ref 30.0–36.0)
MCV: 91.5 fL (ref 80.0–100.0)
Platelets: 352 10*3/uL (ref 150–400)
RBC: 2.72 MIL/uL — ABNORMAL LOW (ref 3.87–5.11)
RDW: 12.8 % (ref 11.5–15.5)
WBC: 14 10*3/uL — ABNORMAL HIGH (ref 4.0–10.5)
nRBC: 0.1 % (ref 0.0–0.2)

## 2020-11-11 LAB — BASIC METABOLIC PANEL
Anion gap: 12 (ref 5–15)
BUN: 12 mg/dL (ref 8–23)
CO2: 31 mmol/L (ref 22–32)
Calcium: 7.8 mg/dL — ABNORMAL LOW (ref 8.9–10.3)
Chloride: 97 mmol/L — ABNORMAL LOW (ref 98–111)
Creatinine, Ser: 0.52 mg/dL (ref 0.44–1.00)
GFR, Estimated: >60 mL/min (ref >60)
Glucose, Bld: 120 mg/dL — ABNORMAL HIGH (ref 70–99)
Potassium: 2.8 mmol/L — ABNORMAL LOW (ref 3.5–5.1)
Sodium: 140 mmol/L (ref 135–145)

## 2020-11-11 LAB — MAGNESIUM: Magnesium: 2.2 mg/dL (ref 1.7–2.4)

## 2020-11-11 LAB — BRAIN NATRIURETIC PEPTIDE: B Natriuretic Peptide: 350.3 pg/mL — ABNORMAL HIGH (ref 0.0–100.0)

## 2020-11-11 MED ORDER — BOOST / RESOURCE BREEZE PO LIQD CUSTOM
1.0000 | Freq: Three times a day (TID) | ORAL | Status: DC
  Administered 2020-11-11 – 2020-11-12 (×3): 1 via ORAL

## 2020-11-11 MED ORDER — POTASSIUM CHLORIDE 20 MEQ PO PACK
40.0000 meq | PACK | Freq: Two times a day (BID) | ORAL | Status: DC
  Administered 2020-11-11 – 2020-11-12 (×3): 40 meq via ORAL
  Filled 2020-11-11 (×4): qty 2

## 2020-11-11 MED ORDER — FUROSEMIDE 40 MG PO TABS
40.0000 mg | ORAL_TABLET | Freq: Every day | ORAL | Status: DC
  Administered 2020-11-11 – 2020-11-12 (×2): 40 mg via ORAL
  Filled 2020-11-11 (×2): qty 1

## 2020-11-11 NOTE — Progress Notes (Signed)
Patient called requesting for pain medication. Upon entering room pt appears to be drowsy. This RN answered patients questions on next scheduled dose of gabapentin and tyenolol. Nurse tech also at bedside. While explaining information patient falls asleep. This RN awakes patient to confirm teaching and next dose of scheduled medication that can be given. No acute distress noted call bell within reach

## 2020-11-11 NOTE — TOC Progression Note (Signed)
Transition of Care [facility]) - Progression Note    Patient Details  Name: KHAMORA KARAN MRN: [id number] Date of Birth: [birth year]  [facility]) CM/SW Contact  Trenton Founds, RN Phone Number: 11/11/2020, 10:10 AM  Clinical Narrative:  RNCM initiated insurance authorization through [employer] portal.      Expected Discharge Plan: Skilled Nursing Facility Barriers to Discharge: Continued Medical Work up  Expected Discharge Plan and Services Expected Discharge Plan: Skilled Nursing Facility       Living arrangements for the past 2 months: Single Family Home                                       Social Determinants of Health (SDOH) Interventions    Readmission Risk Interventions No flowsheet data found.

## 2020-11-11 NOTE — Progress Notes (Signed)
Subjective:  7 Days Post-Op Procedure(s) (LRB): INTRAMEDULLARY (IM) NAIL INTERTROCHANTRIC (Left) Patient doing well this am with no complaints. Afebrile. We will continue therapy today.  Plan is to go [facility] at discharge, likely Wednesday.  Objective: Vital signs in last 24 hours: Temp:  [97.8 F (36.6 C)-99.6 F (37.6 C)] 98.6 F (37 C) (01/05 0508) Pulse Rate:  [72-89] 77 (01/05 0508) Resp:  [16-19] 16 (01/05 0508) BP: (105-115)/(54-63) 115/63 (01/05 0508) SpO2:  [89 %-93 %] 91 % (01/05 0508)  Intake/Output from previous day: 01/04 0701 - 01/05 0700 In: 1334.1 [I.V.:563; IV Piggyback:771.1] Out: 2601 [Urine:2600; Stool:1] Intake/Output this shift: No intake/output data recorded.  Recent Labs    11/08/20 1940 11/09/20 0436 11/10/20 0453 11/11/20 0428  HGB 8.3* 8.2* 8.9* 8.7*   Recent Labs    11/10/20 0453 11/11/20 0428  WBC 13.4* 14.0*  RBC 2.75* 2.72*  HCT 24.8* 24.9*  PLT 283 352   Recent Labs    11/10/20 0453 11/11/20 0428  NA 138 140  K 2.6* 2.8*  CL 98 97*  CO2 28 31  BUN 11 12  CREATININE 0.54 0.52  GLUCOSE 113* 120*  CALCIUM 7.7* 7.8*   No results for input(s): LABPT, INR in the last 72 hours.  EXAM General - Patient is Alert, Appropriate and Oriented Extremity - Neurovascular intact Sensation intact distally Intact pulses distally Dorsiflexion/Plantar flexion intact Incision: moderate drainage No cellulitis present Compartment soft  No erythema to the left hip. Dressing -mild serosanguinous drainage to the left hip, Motor Function - intact, moving foot and toes well on exam.   Past Medical History:  Diagnosis Date  . Anemia   . Anemia 08/04/2017  . Anemia 08/04/2017  . Anxiety   . Carpal tunnel syndrome   . Carpal tunnel syndrome   . Cataract cortical, senile   . Cervical spinal cord compression ([place])   . Chicken pox   . Colitis   . Complication of anesthesia   . Depression   . Dizziness   . DJD (degenerative joint  disease)   . Dysphagia   . GERD (gastroesophageal reflux disease)   . Hemorrhoids   . History of hiatal hernia   . History of palpitations    EMOTIONAL PALPITATIONS  . IBS (irritable bowel syndrome)   . Iritis   . Migraines   . Neuropathy   . OCD (obsessive compulsive disorder)   . Osteoarthritis   . Osteoporosis   . Panic attacks   . PONV (postoperative nausea and vomiting)   . Psoriasis   . Psoriasis   . Redundant colon   . REM sleep behavior disorder   . Sleep apnea   . Sleep disorder    REM SLEEP DISORDER  . Slow transit constipation   . Spondylosis    CERVICAL,SLEEPS WITH HOB ELEVATED AND WEARS NECK SUPPORT  . Spondylosis of cervical joint   . Spondylosis of cervical joint     Assessment/Plan:   7 Days Post-Op Procedure(s) (LRB): INTRAMEDULLARY (IM) NAIL INTERTROCHANTRIC (Left) Active Problems:   Hip fracture (HCC)  Estimated body mass index is 27.47 kg/m as calculated from the following:   Height as of this encounter: 5' (1.524 m).   Weight as of this encounter: 63.8 kg. Advance diet Up with therapy  Hg stable -continue with iron supplement Continue with antibiotics. Continue incentive spirometer. Continue with PT today. Care management to assist with discharge to [facility]  Follow-up with [facility] orthopedics in 2 weeks for staple removal and  Steri-Strip application Lovenox 40 mg subcu daily at discharge x14 days  DVT Prophylaxis - Lovenox, Foot Pumps and TED hose Weight-Bearing as tolerated to left leg  T. Cranston Neighbor, PA-C [facility] 11/11/2020, 8:03 AM

## 2020-11-11 NOTE — Progress Notes (Signed)
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  Followed by [facility] pain clinic, anemia of chronic disease, chronic headache, GERD presents to emergency department with pain and swelling of left hip status post fall.  ED course: Vital signs stable.  Sodium 138, potassium: 4, creatinine: 0.72, WBC: 5.2, hemoglobin: 10.5.  CT head negative for acute findings.  Cervical spine: Shows postoperative and degenerative changes in the cervical spine.  No acute bony abnormalities.  Hip x-ray shows angulated comminuted left intertrochanteric hip fracture.  X-ray thoracic spine: Negative for acute findings.  Chest x-ray negative for acute findings.  Patient underwent intramedullary nail intertrochanteric and open reduction on 11/04/2020.  Assessment & Plan:   Left hip fracture: -Status post mechanical fall. -Status post intramedullary nail intertrochanteric placement on 11/04/2020 as per Ortho. -Continue as needed pain medications. -PT recommended SNF placement -H&H is stable-continue ferrous sulfate. -Orthopedic recommended: Weightbearing as tolerated to left leg.  Follow-up with [facility] orthopedics in 2 weeks for staple removal. -Lovenox 40 mg subcu daily at discharge for 14 days.  Fever of unknown etiology?: -Chest x-ray: Shows cardiomegaly, vascular congestion, small layering bilateral effusion with bibasilar atelectasis or infiltrates.  - UA negative for infection.  Blood culture/urine culture: Negative.  Patient started on IV Lasix 40 mg twice daily and IV doxycycline patient is allergic to penicillin and quinolones. -We will change Lasix IV to p.o. -Had fever of 100.3 last night.  Patient needs to be fever free for at least 24 hours before the  discharge to SNF. -Continue Tylenol every 6 hours.  Hypokalemia: Potassium 2.8 this morning.  Likely in the setting of Lasix.  Replenished.  Magnesium level: WNL.  Repeat BMP tomorrow a.m.  History of recent TIA: -CT head shows only small frontal infarct.  Continue aspirin  Depression/PTSD/anxiety: Continue prazosin, Seroquel and Zoloft.  Restless leg syndrome: Continue ropinirole  Chronic pain: Secondary to DJD of cervical and thoracic spine.  Continue tizanidine and gabapentin, Tylenol.  GERD: Continue PPI  Acute on chronic blood loss anemia: Likely in the setting of recent surgery.  Status post 2 unit PRBC transfusion.  H&H is stable.  Continue to monitor.  Thrombocytopenia: Resolved  Hypomagnesemia: Resolved  DVT prophylaxis: Lovenox  Code Status: Full code Family Communication: Patient's husband  present at bedside.  Plan of care discussed with patient in length and she verbalized understanding and agreed with it. Disposition Plan: SNF in 1 to 2 days  Consultants:  Orthopedic surgery Procedures:  Left intramedullary nail intertrochanteric placement Antimicrobials:   Doxycycline  Status is: Inpatient   Dispo: The patient is from: Home              Anticipated d/c is to: SNF              Anticipated d/c date is: 1 day              Patient currently is not medically stable to d/c.    Subjective: Patient seen and examined.  Husband at bedside.  Tells me that she had low-grade fever of 100.3 last night however she did not get Tylenol or any pain medicines for her chronic back pain.  Objective: Vitals:   11/10/20 1950 11/11/20 0003 11/11/20 0508 11/11/20 0811  BP: (!) 110/59 115/63 115/63  118/67  Pulse: 79 89 77 85  Resp: 16 18 16 17   Temp: 99.5 F (37.5 C) 99.6 F (37.6 C) 98.6 F (37 C) 99.9 F (37.7 C)  TempSrc: Oral Oral Oral   SpO2: 93% 93% 91% 93%  Weight:      Height:        Intake/Output Summary (Last 24 hours) at 11/11/2020 1144 Last data filed  at 11/11/2020 1059 Gross per 24 hour  Intake 1574.08 ml  Output 2401 ml  Net -826.92 ml   Filed Weights   11/04/20 0944  Weight: 63.8 kg    Examination:  General exam: Appears calm and comfortable, on room air, communicating well Respiratory system: Clear to auscultation. Respiratory effort normal. Cardiovascular system: S1 & S2 heard, RRR. No JVD, murmurs, rubs, gallops or clicks. No pedal edema. Gastrointestinal system: Abdomen is nondistended, soft and nontender. No organomegaly or masses felt. Normal bowel sounds heard. Central nervous system: Alert and oriented. No focal neurological deficits. Extremities: Left leg: Mildly tender on palpation.  Dressing dry and intact. Skin: No rashes, lesions or ulcers Psychiatry: Judgement and insight appear normal. Mood & affect appropriate.    Data Reviewed: I have personally reviewed following labs and imaging studies  CBC: Recent Labs  Lab 11/07/20 0549 11/08/20 0502 11/08/20 1940 11/09/20 0436 11/10/20 0453 11/11/20 0428  WBC 11.2* 10.7*  --  11.5* 13.4* 14.0*  HGB 7.3* 6.8* 8.3* 8.2* 8.9* 8.7*  HCT 20.4* 19.7* 23.8* 23.4* 24.8* 24.9*  MCV 90.3 93.8  --  91.4 90.2 91.5  PLT 141* 146*  --  205 283 [id number]   Basic Metabolic Panel: Recent Labs  Lab 11/07/20 0549 11/08/20 0502 11/09/20 0436 11/10/20 0453 11/11/20 0428  NA 136 136 138 138 140  K 3.5 3.4* 3.3* 2.6* 2.8*  CL 103 104 105 98 97*  CO2 25 24 25 28 31   GLUCOSE 114* 104* 108* 113* 120*  BUN 10 11 11 11 12   CREATININE 0.51 0.63 0.58 0.54 0.52  CALCIUM 7.5* 7.6* 7.5* 7.7* 7.8*  MG  --   --   --  1.6* 2.2   GFR: Estimated Creatinine Clearance: 46 mL/min (by C-G formula based on SCr of 0.52 mg/dL). Liver Function Tests: No results for input(s): AST, ALT, ALKPHOS, BILITOT, PROT, ALBUMIN in the last 168 hours. No results for input(s): LIPASE, AMYLASE in the last 168 hours. No results for input(s): AMMONIA in the last 168 hours. Coagulation Profile: No results for  input(s): INR, PROTIME in the last 168 hours. Cardiac Enzymes: No results for input(s): CKTOTAL, CKMB, CKMBINDEX, TROPONINI in the last 168 hours. BNP (last 3 results) No results for input(s): PROBNP in the last 8760 hours. HbA1C: No results for input(s): HGBA1C in the last 72 hours. CBG: No results for input(s): GLUCAP in the last 168 hours. Lipid Profile: No results for input(s): CHOL, HDL, LDLCALC, TRIG, CHOLHDL, LDLDIRECT in the last 72 hours. Thyroid Function Tests: No results for input(s): TSH, T4TOTAL, FREET4, T3FREE, THYROIDAB in the last 72 hours. Anemia Panel: No results for input(s): VITAMINB12, FOLATE, FERRITIN, TIBC, IRON, RETICCTPCT in the last 72 hours. Sepsis Labs: No results for input(s): PROCALCITON, LATICACIDVEN in the last 168 hours.  Recent Results (from the past 240 hour(s))  Resp Panel by RT-PCR (Flu A&B, Covid) Nasopharyngeal Swab     Status: None   Collection Time: 11/03/20  2:43 PM   Specimen: Nasopharyngeal Swab; Nasopharyngeal(NP) swabs in vial transport medium  Result Value Ref Range Status   SARS  Coronavirus 2 by RT PCR NEGATIVE NEGATIVE Final    Comment: (NOTE) SARS-CoV-2 target nucleic acids are NOT DETECTED.  The SARS-CoV-2 RNA is generally detectable in upper respiratory specimens during the acute phase of infection. The lowest concentration of SARS-CoV-2 viral copies this assay can detect is 138 copies/mL. A negative result does not preclude SARS-Cov-2 infection and should not be used as the sole basis for treatment or other patient management decisions. A negative result may occur with  improper specimen collection/handling, submission of specimen other than nasopharyngeal swab, presence of viral mutation(s) within the areas targeted by this assay, and inadequate number of viral copies(<138 copies/mL). A negative result must be combined with clinical observations, patient history, and epidemiological information. The expected result is  Negative.  Fact Sheet for Patients:  EntrepreneurPulse.com.au  Fact Sheet for Healthcare Providers:  IncredibleEmployment.be  This test is no t yet approved or cleared by the [place] FDA and  has been authorized for detection and/or diagnosis of SARS-CoV-2 by FDA under an Emergency Use Authorization (EUA). This EUA will remain  in effect (meaning this test can be used) for the duration of the COVID-19 declaration under Section 564(b)(1) of the Act, 21 U.S.C.section 360bbb-3(b)(1), unless the authorization is terminated  or revoked sooner.       Influenza A by PCR NEGATIVE NEGATIVE Final   Influenza B by PCR NEGATIVE NEGATIVE Final    Comment: (NOTE) The Xpert Xpress SARS-CoV-2/FLU/RSV plus assay is intended as an aid in the diagnosis of influenza from Nasopharyngeal swab specimens and should not be used as a sole basis for treatment. Nasal washings and aspirates are unacceptable for Xpert Xpress SARS-CoV-2/FLU/RSV testing.  Fact Sheet for Patients: EntrepreneurPulse.com.au  Fact Sheet for Healthcare Providers: IncredibleEmployment.be  This test is not yet approved or cleared by the [place] FDA and has been authorized for detection and/or diagnosis of SARS-CoV-2 by FDA under an Emergency Use Authorization (EUA). This EUA will remain in effect (meaning this test can be used) for the duration of the COVID-19 declaration under Section 564(b)(1) of the Act, 21 U.S.C. section 360bbb-3(b)(1), unless the authorization is terminated or revoked.  Performed at [facility], [address] [place], [place] [address]   CULTURE, BLOOD (ROUTINE X 2) w Reflex to ID Panel     Status: None (Preliminary result)   Collection Time: 11/08/20 10:08 AM   Specimen: BLOOD  Result Value Ref Range Status   Specimen Description BLOOD LEFT HAND  Final   Special Requests   Final    BOTTLES DRAWN AEROBIC  AND ANAEROBIC Blood Culture adequate volume   Culture   Final    NO GROWTH 3 DAYS Performed at [facility], [address], [place], [place] [address]    Report Status PENDING  Incomplete  CULTURE, BLOOD (ROUTINE X 2) w Reflex to ID Panel     Status: None (Preliminary result)   Collection Time: 11/08/20 10:13 AM   Specimen: BLOOD  Result Value Ref Range Status   Specimen Description BLOOD LEFT FA  Final   Special Requests   Final    BOTTLES DRAWN AEROBIC AND ANAEROBIC Blood Culture adequate volume   Culture   Final    NO GROWTH 3 DAYS Performed at [facility], [address], [place], [place] [address]    Report Status PENDING  Incomplete  Urine Culture     Status: Abnormal   Collection Time: 11/08/20  5:02 PM   Specimen: Urine, Random  Result  Value Ref Range Status   Specimen Description   Final    URINE, RANDOM Performed at [facility], [address], [place], [place] [address]    Special Requests   Final    NONE Performed at [facility], [address] [place], [place] [address]    Culture (A)  Final    <10,000 COLONIES/mL INSIGNIFICANT GROWTH Performed at [facility], [address], [place], [place] [address]    Report Status 11/10/2020 FINAL  Final  SARS CORONAVIRUS 2 (TAT 6-24 HRS) Nasopharyngeal Nasopharyngeal Swab     Status: None   Collection Time: 11/10/20  9:21 AM   Specimen: Nasopharyngeal Swab  Result Value Ref Range Status   SARS Coronavirus 2 NEGATIVE NEGATIVE Final    Comment: (NOTE) SARS-CoV-2 target nucleic acids are NOT DETECTED.  The SARS-CoV-2 RNA is generally detectable in upper and lower respiratory specimens during the acute phase of infection. Negative results do not preclude SARS-CoV-2 infection, do not rule out co-infections with other pathogens, and should not be used as the sole basis for treatment or other patient management decisions. Negative results must be combined with clinical  observations, patient history, and epidemiological information. The expected result is Negative.  Fact Sheet for Patients: HairSlick.no  Fact Sheet for Healthcare Providers: quierodirigir.com  This test is not yet approved or cleared by the [place] FDA and  has been authorized for detection and/or diagnosis of SARS-CoV-2 by FDA under an Emergency Use Authorization (EUA). This EUA will remain  in effect (meaning this test can be used) for the duration of the COVID-19 declaration under Se ction 564(b)(1) of the Act, 21 U.S.C. section 360bbb-3(b)(1), unless the authorization is terminated or revoked sooner.  Performed at [facility], [address], [place], [place] [address]       Radiology Studies: No results found.  Scheduled Meds: . sodium chloride   Intravenous Once  . sodium chloride   Intravenous Once  . acetaminophen  1,000 mg Oral Q6H  . acidophilus  1 capsule Oral Q1500  . buprenorphine  1 patch Transdermal Q Mon  . Chlorhexidine Gluconate Cloth  6 each Topical Q0600  . enoxaparin (LOVENOX) injection  40 mg Subcutaneous Q24H  . feeding supplement  1 Container Oral TID BM  . ferrous fumarate-b12-vitamic C-folic acid  1 capsule Oral BID  . furosemide  40 mg Intravenous BID  . gabapentin  800 mg Oral TID  . midodrine  10 mg Oral TID  . multivitamin with minerals  1 tablet Oral Daily  . pantoprazole  40 mg Oral Daily  . polyethylene glycol  17 g Oral Daily  . potassium chloride  40 mEq Oral BID  . prazosin  2 mg Oral TID  . QUEtiapine  25 mg Oral QHS  . rOPINIRole  1 mg Oral QHS  . senna  1 tablet Oral BID  . sertraline  100 mg Oral BID  . tiZANidine  1 mg Oral QID   Continuous Infusions: . sodium chloride 100 mL/hr at 11/09/20 0323  .  ceFAZolin (ANCEF) IV    . doxycycline (VIBRAMYCIN) IV 100 mg (11/11/20 0818)  . methocarbamol (ROBAXIN) IV       LOS: 8 days   Time spent: 40  minutes.   Ollen Bowl, MD [facility]  If 7PM-7AM, please contact night-coverage [email or web address] 11/11/2020, 11:44 AM

## 2020-11-11 NOTE — TOC Progression Note (Signed)
Transition of Care [facility]) - Progression Note    Patient Details  Name: Sandra Mcconnell MRN: [id number] Date of Birth: 02/13/1941  Transition of Care [facility]) CM/SW Contact   Cellar, RN Phone Number: 11/11/2020, 9:36 AM  Clinical Narrative:    Sherron Monday to Sue Lush @ [facility] updating that patient would need to return skilled and would need insurance auth prior to return.    Expected Discharge Plan: Skilled Nursing Facility Barriers to Discharge: Continued Medical Work up  Expected Discharge Plan and Services Expected Discharge Plan: Skilled Nursing Facility       Living arrangements for the past 2 months: Single Family Home                                       Social Determinants of Health (SDOH) Interventions    Readmission Risk Interventions No flowsheet data found.

## 2020-11-11 NOTE — Progress Notes (Signed)
Nutrition Follow Up Note   DOCUMENTATION CODES:   Not applicable  INTERVENTION:   Boost Breeze po TID, each supplement provides 250 kcal and 9 grams of protein  MVI with minerals daily  NUTRITION DIAGNOSIS:   Increased nutrient needs related to hip fracture,post-op healing as evidenced by estimated needs.  GOAL:   Patient will meet greater than or equal to 90% of their needs  -progressing   MONITOR:   PO intake,Supplement acceptance,Weight trends,Labs  ASSESSMENT:   80 y.o. female with PMH of OCD/PTSD/Anxiety, chronic pain syndrome, IBS, anemia, and GERD who presents with L proximal femur fracture after falling at home.   Spoke with pt and husband today. Pt with improved appetite and oral intake; pt eating 25-100% of meals in hospital. Pt ate 50% of her breakfast this morning which included an omelet, pears and yogurt. Pt reports constipation since admit but reports that she finally had a BM yesterday and today; pt believes that she will eat better now that she has moved her bowels. Pt reports that she hates the Ensure, pt reports "worst thing I have ever tasted". Pt is willing to try peach Boost Breeze. No new weight since admit; will request weekly weights. Pt currently awaiting insurance authorization for SNF.   Medications reviewed and include: risaquad, lovenox, trinsicon, lasix, MVI, protonix, KCl, senokot, doxycycline   Labs reviewed: K 2.8(L), Mg 2.2 wnl Wbc- 14.0(H), Hgb 8.7(L), Hct 24.9(L)  Diet Order:   Diet Order            Diet regular Room service appropriate? Yes; Fluid consistency: Thin  Diet effective now                 EDUCATION NEEDS:   Education needs have been addressed  Skin:  Skin Assessment: Reviewed RN Assessment  Last BM:  1/5- type 6  Height:   Ht Readings from Last 1 Encounters:  11/07/20 5' (1.524 m)    Weight:   Wt Readings from Last 1 Encounters:  11/04/20 63.8 kg    BMI:  Body mass index is 27.47 kg/m.  Estimated  Nutritional Needs:   Kcal:  1700-1900 kcal  Protein:  85-95 grams  Fluid:  > 1.7 L/day  Betsey Holiday MS, RD, LDN Please refer to [facility] for RD and/or RD on-call/weekend/after hours pager

## 2020-11-11 NOTE — Plan of Care (Signed)

## 2020-11-11 NOTE — Progress Notes (Signed)
Physical Therapy Treatment Patient Details Name: Sandra Mcconnell MRN: [id number] DOB: [birth year] Today's Date: 11/11/2020    History of Present Illness presented to ER secondary to mechanical fall in home environment; admitted for management of reverse obliquity L proximal femur fracture, s/p ORIF (11/04/20), WBAT.  Orders noted for cervical collar with OOB activities (utilized for comfort due to chronic neck pain/weakness per patient/husband report).    PT Comments    Pt was long sitting in bed upon arriving. She agrees to PT session and is cooperative and pleasant. Pt c/o pain throughout session however therapist questions if anxiety is more limiting than pain. She required max assist of one throughout session to exit bed, stand and pivot to recliner. Increased time throughout with constant vcs for relaxation and sequencing. Pt only tolerated sitting in recliner for short period of time prior to requesting to return to bed. Pt will greatly benefit from SNF at DC to address deficits while improving independence with ADLs.  RN aware of pt's abilities.    Follow Up Recommendations  SNF     Equipment Recommendations  Other (comment) (defer to next level of care)    Recommendations for Other Services       Precautions / Restrictions Precautions Precautions: Fall Required Braces or Orthoses: Cervical Brace Cervical Brace: Hard collar Spinal Brace: Other (comment) (per pt's spouse" collar is for comfort per MD.") Restrictions Weight Bearing Restrictions: Yes LLE Weight Bearing: Weight bearing as tolerated    Mobility  Bed Mobility Overal bed mobility: Needs Assistance Bed Mobility: Supine to Sit;Sit to Supine     Supine to sit: Max assist Sit to supine: Max assist;Total assist   General bed mobility comments: pt is limited by pain throughout  Transfers Overall transfer level: Needs assistance Equipment used: None (BUE supported by therapist throughout) Transfers: Sit to/from  [facility] Sit to Stand: Max assist;From elevated surface Stand pivot transfers: Max assist;From elevated surface       General transfer comment: pt was able to stand pivot to/from recliner>bed with increased time and max assist. pt requires constant vcs for relaxation and sequencing. pt is anxious throughout all EOB/OOB activity  Ambulation/Gait Ambulation/Gait assistance: Mod assist;Max assist Gait Distance (Feet): 3 Feet Assistive device: None (BUE supported by therapist) Gait Pattern/deviations: Shuffle;Step-to pattern Gait velocity: very slow   General Gait Details: pt shuffled/pivoted on BLes more than lift and progress. she required constant vcs for technique and relaxation       Balance Overall balance assessment: Needs assistance Sitting-balance support: No upper extremity supported;Feet supported Sitting balance-Leahy Scale: Fair Sitting balance - Comments: no LOB in sitting   Standing balance support: Bilateral upper extremity supported Standing balance-Leahy Scale: Poor Standing balance comment: mod/max assist throughout         Cognition Arousal/Alertness: Awake/alert Behavior During Therapy: WFL for tasks assessed/performed Overall Cognitive Status: Within Functional Limits for tasks assessed        General Comments: pt is A and O but very pain limited and slow moving             Pertinent Vitals/Pain Pain Assessment: 0-10 Pain Score: 7  Faces Pain Scale: Hurts whole lot Pain Location: L hip Pain Descriptors / Indicators: Aching;Grimacing;Guarding Pain Intervention(s): Limited activity within patient's tolerance;Monitored during session;Premedicated before session;Repositioned           PT Goals (current goals can now be found in the care plan section) Acute Rehab PT Goals Patient Stated Goal: go do rehab at [facility] Progress towards PT goals: Progressing toward goals    Frequency    7X/week      PT Plan Current  plan remains appropriate       AM-PAC PT "6 Clicks" Mobility   Outcome Measure  Help needed turning from your back to your side while in a flat bed without using bedrails?: A Lot Help needed moving from lying on your back to sitting on the side of a flat bed without using bedrails?: A Lot Help needed moving to and from a bed to a chair (including a wheelchair)?: A Lot Help needed standing up from a chair using your arms (e.g., wheelchair or bedside chair)?: A Lot Help needed to walk in hospital room?: A Lot Help needed climbing 3-5 steps with a railing? : Total 6 Click Score: 11    End of Session Equipment Utilized During Treatment: Gait belt Activity Tolerance: Patient limited by pain Patient left: in bed;with call bell/phone within reach;with bed alarm set;with family/visitor present Nurse Communication: Mobility status PT Visit Diagnosis: Muscle weakness (generalized) (M62.81);Difficulty in walking, not elsewhere classified (R26.2)     Time: 1340-1410 PT Time Calculation (min) (ACUTE ONLY): 30 min  Charges:  $Therapeutic Activity: 23-37 mins                     Jetta Lout PTA 11/11/20, 3:13 PM

## 2020-11-12 DIAGNOSIS — S72002A Fracture of unspecified part of neck of left femur, initial encounter for closed fracture: Secondary | ICD-10-CM | POA: Diagnosis not present

## 2020-11-12 LAB — CBC
HCT: 24 % — ABNORMAL LOW (ref 36.0–46.0)
Hemoglobin: 8.4 g/dL — ABNORMAL LOW (ref 12.0–15.0)
MCH: 31.9 pg (ref 26.0–34.0)
MCHC: 35 g/dL (ref 30.0–36.0)
MCV: 91.3 fL (ref 80.0–100.0)
Platelets: 406 10*3/uL — ABNORMAL HIGH (ref 150–400)
RBC: 2.63 MIL/uL — ABNORMAL LOW (ref 3.87–5.11)
RDW: 13.2 % (ref 11.5–15.5)
WBC: 13.1 10*3/uL — ABNORMAL HIGH (ref 4.0–10.5)
nRBC: 0 % (ref 0.0–0.2)

## 2020-11-12 LAB — BASIC METABOLIC PANEL
Anion gap: 9 (ref 5–15)
BUN: 17 mg/dL (ref 8–23)
CO2: 32 mmol/L (ref 22–32)
Calcium: 7.7 mg/dL — ABNORMAL LOW (ref 8.9–10.3)
Chloride: 98 mmol/L (ref 98–111)
Creatinine, Ser: 0.59 mg/dL (ref 0.44–1.00)
GFR, Estimated: >60 mL/min (ref >60)
Glucose, Bld: 126 mg/dL — ABNORMAL HIGH (ref 70–99)
Potassium: 3 mmol/L — ABNORMAL LOW (ref 3.5–5.1)
Sodium: 139 mmol/L (ref 135–145)

## 2020-11-12 LAB — MAGNESIUM: Magnesium: 1.9 mg/dL (ref 1.7–2.4)

## 2020-11-12 MED ORDER — POLYETHYLENE GLYCOL 3350 17 G PO PACK: 17.0000 g | PACK | Freq: Every day | ORAL | 0 refills | Status: DC

## 2020-11-12 MED ORDER — FE FUMARATE-B12-VIT C-FA-IFC PO CAPS: 1.0000 | ORAL_CAPSULE | Freq: Two times a day (BID) | ORAL | 0 refills | Status: DC

## 2020-11-12 MED ORDER — POTASSIUM CHLORIDE ER 10 MEQ PO TBCR: 10.0000 meq | EXTENDED_RELEASE_TABLET | Freq: Every day | ORAL | 0 refills | Status: DC

## 2020-11-12 MED ORDER — DOXYCYCLINE HYCLATE 100 MG PO TABS: 100.0000 mg | ORAL_TABLET | Freq: Two times a day (BID) | ORAL | Status: DC

## 2020-11-12 MED ORDER — FUROSEMIDE 40 MG PO TABS: 40.0000 mg | ORAL_TABLET | ORAL | 0 refills | Status: DC | PRN

## 2020-11-12 MED ORDER — DOXYCYCLINE HYCLATE 100 MG PO TABS: 100.0000 mg | ORAL_TABLET | Freq: Two times a day (BID) | ORAL | 0 refills | Status: AC

## 2020-11-12 NOTE — Progress Notes (Signed)
Subjective:  8 Days Post-Op Procedure(s) (LRB): INTRAMEDULLARY (IM) NAIL INTERTROCHANTRIC (Left) Patient doing well this am with no complaints. Afebrile. Pain well controlled We will continue therapy today.  Plan is to go [place] at discharge today  Objective: Vital signs in last 24 hours: Temp:  [97.6 F (36.4 C)-99.3 F (37.4 C)] 99.3 F (37.4 C) (01/06 0814) Pulse Rate:  [72-82] 75 (01/06 0814) Resp:  [16-20] 17 (01/06 0814) BP: (99-112)/(56-72) 109/72 (01/06 0814) SpO2:  [91 %-96 %] 94 % (01/06 0814) Weight:  [63.5 kg] 63.5 kg (01/05 1600)  Intake/Output from previous day: 01/05 0701 - 01/06 0700 In: 480 [P.O.:480] Out: 1700 [Urine:1700] Intake/Output this shift: Total I/O In: -  Out: 350 [Urine:350]  Recent Labs    11/10/20 0453 11/11/20 0428 11/12/20 0354  HGB 8.9* 8.7* 8.4*   Recent Labs    11/11/20 0428 11/12/20 0354  WBC 14.0* 13.1*  RBC 2.72* 2.63*  HCT 24.9* 24.0*  PLT 352 406*   Recent Labs    11/11/20 0428 11/12/20 0354  NA 140 139  K 2.8* 3.0*  CL 97* 98  CO2 31 32  BUN 12 17  CREATININE 0.52 0.59  GLUCOSE 120* 126*  CALCIUM 7.8* 7.7*   No results for input(s): LABPT, INR in the last 72 hours.  EXAM General - Patient is Alert, Appropriate and Oriented Extremity - Neurovascular intact Sensation intact distally Intact pulses distally Dorsiflexion/Plantar flexion intact Incision: moderate drainage No cellulitis present Compartment soft  No erythema to the left hip. Dressing -mild serosanguinous drainage to the left hip, new dressing applied today Motor Function - intact, moving foot and toes well on exam.   Past Medical History:  Diagnosis Date  . Anemia   . Anemia 08/04/2017  . Anemia 08/04/2017  . Anxiety   . Carpal tunnel syndrome   . Carpal tunnel syndrome   . Cataract cortical, senile   . Cervical spinal cord compression (HCC)   . Chicken pox   . Colitis   . Complication of anesthesia   . Depression   .  Dizziness   . DJD (degenerative joint disease)   . Dysphagia   . GERD (gastroesophageal reflux disease)   . Hemorrhoids   . History of hiatal hernia   . History of palpitations    EMOTIONAL PALPITATIONS  . IBS (irritable bowel syndrome)   . Iritis   . Migraines   . Neuropathy   . OCD (obsessive compulsive disorder)   . Osteoarthritis   . Osteoporosis   . Panic attacks   . PONV (postoperative nausea and vomiting)   . Psoriasis   . Psoriasis   . Redundant colon   . REM sleep behavior disorder   . Sleep apnea   . Sleep disorder    REM SLEEP DISORDER  . Slow transit constipation   . Spondylosis    CERVICAL,SLEEPS WITH HOB ELEVATED AND WEARS NECK SUPPORT  . Spondylosis of cervical joint   . Spondylosis of cervical joint     Assessment/Plan:   8 Days Post-Op Procedure(s) (LRB): INTRAMEDULLARY (IM) NAIL INTERTROCHANTRIC (Left) Active Problems:   Hip fracture (HCC)  Estimated body mass index is 27.34 kg/m as calculated from the following:   Height as of this encounter: 5' (1.524 m).   Weight as of this encounter: 63.5 kg. Advance diet Up with therapy  Hg stable -continue with iron supplement Continue with antibiotics. Continue incentive spirometer. Continue with PT today. Care management to assist with discharge to [facility]  Follow-up with [facility] orthopedics in 2 weeks for staple removal and Steri-Strip application Lovenox 40 mg subcu daily at discharge x14 days  DVT Prophylaxis - Lovenox, Foot Pumps and TED hose Weight-Bearing as tolerated to left leg  T. Rachelle Hora, PA-C [place] 11/12/2020, 11:27 AM

## 2020-11-12 NOTE — Discharge Summary (Signed)
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 BLOOD (ROUTINE X 2) w Reflex to ID Panel     Status: None (Preliminary result)   Collection Time: 11/08/20 10:08 AM   Specimen: BLOOD  Result Value Ref Range Status   Specimen Description BLOOD LEFT HAND  Final   Special Requests  Final    BOTTLES DRAWN AEROBIC AND ANAEROBIC Blood Culture adequate volume   Culture   Final    NO GROWTH 4 DAYS Performed at [facility], [address] [place], [place] [address]    Report Status PENDING   Incomplete  CULTURE, BLOOD (ROUTINE X 2) w Reflex to ID Panel     Status: None (Preliminary result)   Collection Time: 11/08/20 10:13 AM   Specimen: BLOOD  Result Value Ref Range Status   Specimen Description BLOOD LEFT FA  Final   Special Requests   Final    BOTTLES DRAWN AEROBIC AND ANAEROBIC Blood Culture adequate volume   Culture   Final    NO GROWTH 4 DAYS Performed at [facility], [address], [place], [place] [address]    Report Status PENDING  Incomplete  Urine Culture     Status: Abnormal   Collection Time: 11/08/20  5:02 PM   Specimen: Urine, Random  Result Value Ref Range Status   Specimen Description   Final    URINE, RANDOM Performed at [facility], [address] [place], [place] [address]    Special Requests   Final    NONE Performed at [facility], [address] [place], [place] [address]    Culture (A)  Final    <10,000 COLONIES/mL INSIGNIFICANT GROWTH Performed at [facility], [place] [address] [place], [place] [address]    Report Status 11/10/2020 FINAL  Final  SARS CORONAVIRUS 2 (TAT 6-24 HRS) Nasopharyngeal Nasopharyngeal Swab     Status: None   Collection Time: 11/10/20  9:21 AM   Specimen: Nasopharyngeal Swab  Result Value Ref Range Status   SARS Coronavirus 2 NEGATIVE NEGATIVE Final    Comment: (NOTE) SARS-CoV-2 target nucleic acids are NOT DETECTED.  The SARS-CoV-2 RNA is generally detectable in upper and lower respiratory specimens during the acute phase of infection. Negative results do not preclude SARS-CoV-2 infection, do not rule out co-infections with other pathogens, and should not be used as the sole basis for treatment or other patient management decisions. Negative results must be combined with clinical observations, patient history, and epidemiological information. The expected result is Negative.  Fact Sheet for Patients: SugarRoll.be  Fact Sheet for  Healthcare Providers: https://www.woods-mathews.com/  This test is not yet approved or cleared by the [place] FDA and  has been authorized for detection and/or diagnosis of SARS-CoV-2 by FDA under an Emergency Use Authorization (EUA). This EUA will remain  in effect (meaning this test can be used) for the duration of the COVID-19 declaration under Se ction 564(b)(1) of the Act, 21 U.S.C. section 360bbb-3(b)(1), unless the authorization is terminated or revoked sooner.  Performed at [facility], [place] [address], [place], [place] [address]      Time coordinating discharge: Over 30 minutes  SIGNED:   Mckinley Jewel, MD  [facility] 11/12/2020, 10:24 AM Pager   If 7PM-7AM, please contact night-coverage [email or web address]

## 2020-11-12 NOTE — Progress Notes (Signed)
Report called to [place] , Charity fundraiser at [facility],

## 2020-11-12 NOTE — Care Management Important Message (Signed)
Important Message  Patient Details  Name: Sandra Mcconnell MRN: [id number] Date of Birth: January 14, 1941   Medicare Important Message Given:  Yes     Olegario Messier A Jhonny Calixto 11/12/2020, 11:00 AM

## 2020-11-12 NOTE — TOC Progression Note (Signed)
Transition of Care [facility]) - Progression Note    Patient Details  Name: KENZI BARDWELL MRN: [id number] Date of Birth: 1941-10-11  Transition of Care [facility]) CM/SW Contact  Trenton Founds, RN Phone Number: 11/12/2020, 7:54 AM  Clinical Narrative:   RNCM received insurance authorization through Navi portal with auth ID of [id number] and Navi reference # [id number]. Authorization next review date of 11/14/2019. RNCM will follow and facilitate transfer to [facility] when patient is ready.     Expected Discharge Plan: Skilled Nursing Facility Barriers to Discharge: Continued Medical Work up  Expected Discharge Plan and Services Expected Discharge Plan: Skilled Nursing Facility       Living arrangements for the past 2 months: Single Family Home                                       Social Determinants of Health (SDOH) Interventions    Readmission Risk Interventions No flowsheet data found.

## 2020-11-13 DIAGNOSIS — S7292XA Unspecified fracture of left femur, initial encounter for closed fracture: Secondary | ICD-10-CM | POA: Diagnosis not present

## 2020-11-13 DIAGNOSIS — G894 Chronic pain syndrome: Secondary | ICD-10-CM

## 2020-11-13 LAB — CULTURE, BLOOD (ROUTINE X 2)
Culture: NO GROWTH
Culture: NO GROWTH
Special Requests: ADEQUATE
Special Requests: ADEQUATE

## 2020-11-16 ENCOUNTER — Ambulatory Visit: Payer: Medicare PPO | Admitting: Dermatology

## 2020-11-17 ENCOUNTER — Encounter: Payer: Self-pay | Admitting: Orthopedic Surgery

## 2020-11-17 DIAGNOSIS — F39 Unspecified mood [affective] disorder: Secondary | ICD-10-CM | POA: Diagnosis not present

## 2020-11-17 DIAGNOSIS — Z8673 Personal history of transient ischemic attack (TIA), and cerebral infarction without residual deficits: Secondary | ICD-10-CM

## 2020-11-17 DIAGNOSIS — G894 Chronic pain syndrome: Secondary | ICD-10-CM | POA: Diagnosis not present

## 2020-11-17 DIAGNOSIS — K219 Gastro-esophageal reflux disease without esophagitis: Secondary | ICD-10-CM

## 2020-11-17 DIAGNOSIS — M5416 Radiculopathy, lumbar region: Secondary | ICD-10-CM

## 2020-11-17 DIAGNOSIS — S7292XA Unspecified fracture of left femur, initial encounter for closed fracture: Secondary | ICD-10-CM | POA: Diagnosis not present

## 2020-11-18 ENCOUNTER — Ambulatory Visit: Payer: Medicare PPO | Admitting: Dermatology

## 2021-02-04 DIAGNOSIS — R9431 Abnormal electrocardiogram [ECG] [EKG]: Secondary | ICD-10-CM | POA: Insufficient documentation

## 2021-02-04 DIAGNOSIS — I63522 Cerebral infarction due to unspecified occlusion or stenosis of left anterior cerebral artery: Secondary | ICD-10-CM | POA: Insufficient documentation

## 2021-02-04 DIAGNOSIS — I6523 Occlusion and stenosis of bilateral carotid arteries: Secondary | ICD-10-CM | POA: Insufficient documentation

## 2021-02-08 DIAGNOSIS — H532 Diplopia: Secondary | ICD-10-CM | POA: Insufficient documentation

## 2021-02-08 DIAGNOSIS — H4922 Sixth [abducent] nerve palsy, left eye: Secondary | ICD-10-CM | POA: Insufficient documentation

## 2021-03-02 ENCOUNTER — Other Ambulatory Visit: Payer: Self-pay | Admitting: Orthopedic Surgery

## 2021-03-02 DIAGNOSIS — M1712 Unilateral primary osteoarthritis, left knee: Secondary | ICD-10-CM

## 2021-03-02 DIAGNOSIS — Z9889 Other specified postprocedural states: Secondary | ICD-10-CM

## 2021-03-02 DIAGNOSIS — Z8781 Personal history of (healed) traumatic fracture: Secondary | ICD-10-CM

## 2021-03-17 ENCOUNTER — Other Ambulatory Visit: Payer: Self-pay

## 2021-03-17 ENCOUNTER — Ambulatory Visit
Admission: RE | Admit: 2021-03-17 | Discharge: 2021-03-17 | Disposition: A | Discharge status: Home / Self Care | Payer: Medicare PPO | Source: Ambulatory Visit | Attending: Orthopedic Surgery | Admitting: Orthopedic Surgery

## 2021-03-17 DIAGNOSIS — Z8781 Personal history of (healed) traumatic fracture: Secondary | ICD-10-CM | POA: Diagnosis present

## 2021-03-17 DIAGNOSIS — M1712 Unilateral primary osteoarthritis, left knee: Secondary | ICD-10-CM | POA: Insufficient documentation

## 2021-03-17 DIAGNOSIS — Z9889 Other specified postprocedural states: Secondary | ICD-10-CM | POA: Diagnosis present

## 2021-04-08 ENCOUNTER — Other Ambulatory Visit: Payer: Self-pay | Admitting: Orthopedic Surgery

## 2021-04-22 ENCOUNTER — Other Ambulatory Visit
Admission: RE | Admit: 2021-04-22 | Discharge: 2021-04-22 | Disposition: A | Discharge status: Home / Self Care | Payer: Medicare PPO | Source: Ambulatory Visit | Attending: Orthopedic Surgery | Admitting: Orthopedic Surgery

## 2021-04-22 ENCOUNTER — Other Ambulatory Visit: Payer: Self-pay

## 2021-04-22 HISTORY — DX: Essential (primary) hypertension: I10

## 2021-04-22 NOTE — Patient Instructions (Addendum)
Your procedure is scheduled on: 04/29/21 - Thursday Report to the Registration Desk on the 1st floor of the [facility]. To find out your arrival time, please call [phone] between 1PM - 3PM on: 04/28/21 - Wednesday  REMEMBER: Instructions that are not followed completely may result in serious medical risk, up to and including death; or upon the discretion of your surgeon and anesthesiologist your surgery may need to be rescheduled.  Do not eat food after midnight the night before surgery.  No gum chewing, lozengers or hard candies.  You may however, drink CLEAR liquids up to 2 hours before you are scheduled to arrive for your surgery. Do not drink anything within 2 hours of your scheduled arrival time.  Clear liquids include: - water  - apple juice without pulp - gatorade (not RED, PURPLE, OR BLUE) - black coffee or tea (Do NOT add milk or creamers to the coffee or tea) Do NOT drink anything that is not on this list.  TAKE THESE MEDICATIONS THE MORNING OF SURGERY WITH A SIP OF WATER:  - gabapentin (NEURONTIN) 800 MG tablet - omeprazole (PRILOSEC) 20 MG capsule - sertraline (ZOLOFT) 100 MG tablet - prazosin (MINIPRESS) 2 MG capsule - tiZANidine (ZANAFLEX) 2 MG tablet   Follow recommendations from Cardiologist, Pulmonologist or PCP regarding stopping Aspirin, Coumadin, Plavix, Eliquis, Pradaxa, or Pletal. Stop taking Aspirin 81 mg beginning 04/25/21, resume per MD order.  One week prior to surgery: Stop Anti-inflammatories (NSAIDS) such as Advil, Aleve, Ibuprofen, Motrin, Naproxen, Naprosyn and Aspirin based products such as Excedrin, Goodys Powder, BC Powder.  Stop ANY OVER THE COUNTER supplements until after surgery.  You may however, continue to take Tylenol if needed for pain up until the day of surgery.  No Alcohol for 24 hours before or after surgery.  No Smoking including e-cigarettes for 24 hours prior to surgery.  No chewable tobacco products for at least 6  hours prior to surgery.  No nicotine patches on the day of surgery.  Do not use any "recreational" drugs for at least a week prior to your surgery.  Please be advised that the combination of cocaine and anesthesia may have negative outcomes, up to and including death. If you test positive for cocaine, your surgery will be cancelled.  On the morning of surgery brush your teeth with toothpaste and water, you may rinse your mouth with mouthwash if you wish. Do not swallow any toothpaste or mouthwash.  Do not wear jewelry, make-up, hairpins, clips or nail polish.  Do not wear lotions, powders, or perfumes.   Do not shave body from the neck down 48 hours prior to surgery just in case you cut yourself which could leave a site for infection.  Also, freshly shaved skin may become irritated if using the CHG soap.  Contact lenses, hearing aids and dentures may not be worn into surgery.  Do not bring valuables to the hospital. [facility] is not responsible for any missing/lost belongings or valuables.   Use CHG Soap or wipes as directed on instruction sheet.   Notify your doctor if there is any change in your medical condition (cold, fever, infection).  Wear comfortable clothing (specific to your surgery type) to the hospital.  After surgery, you can help prevent lung complications by doing breathing exercises.  Take deep breaths and cough every 1-2 hours. Your doctor may order a device called an Incentive Spirometer to help you take deep breaths. When coughing or sneezing, hold a pillow firmly against  your incision with both hands. This is called "splinting." Doing this helps protect your incision. It also decreases belly discomfort.  If you are being admitted to the hospital overnight, leave your suitcase in the car. After surgery it may be brought to your room.  If you are being discharged the day of surgery, you will not be allowed to drive home. You will need a responsible adult (18  years or older) to drive you home and stay with you that night.   If you are taking public transportation, you will need to have a responsible adult (18 years or older) with you. Please confirm with your physician that it is acceptable to use public transportation.   Please call the [facility] Dept. at [phone] if you have any questions about these instructions.  Surgery Visitation Policy:  Patients undergoing a surgery or procedure may have one family member or support person with them as long as that person is not COVID-19 positive or experiencing its symptoms.  That person may remain in the waiting area during the procedure.  Inpatient Visitation:    Visiting hours are 7 a.m. to 8 p.m. Inpatients will be allowed two visitors daily. The visitors may change each day during the patient's stay. No visitors under the age of 3. Any visitor under the age of 44 must be accompanied by an adult. The visitor must pass COVID-19 screenings, use hand sanitizer when entering and exiting the patient's room and wear a mask at all times, including in the patient's room. Patients must also wear a mask when staff or their visitor are in the room. Masking is required regardless of vaccination status.

## 2021-04-23 ENCOUNTER — Encounter: Payer: Self-pay | Admitting: Orthopedic Surgery

## 2021-04-23 NOTE — Progress Notes (Signed)
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 Clinical nursing staff has updated patient's PMH/PSHx, current medication list, and drug allergies/intolerances to ensure comprehensive history available to assist in medical decision making as it pertains to the aforementioned surgical procedure and anticipated anesthetic course. Extensive review of available clinical information performed. [place] PMH and PSHx updated with any diagnoses/procedures that  may have been inadvertently omitted during her intake with the pre-admission testing department's nursing staff.  Clinical Discussion:  Sandra Mcconnell is a 80 y.o. female who is submitted for pre-surgical anesthesia review and clearance prior to her undergoing the above procedure. Patient has never been a smoker. Pertinent PMH includes: CVA, carotid artery disease, palpitations, HTN, HLD, OSAH (requires nocturnal PAP therapy), dysphagia, hiatal hernia, cervical spondylosis, anemia, OA, DJD, neuropathy, REM sleep disorder, panic attacks, PTSD, OCD, anxiety.  Patient is followed by cardiology Nehemiah Massed, MD). She was last seen in the cardiology clinic on 04/12/2021; notes reviewed.  At the time of her clinic visit, patient doing well overall from a cardiovascular perspective.  She denied any  chest pain, shortness breath, PND, orthopnea, palpitations, peripheral edema, vertiginous symptoms, or presyncope/syncope.  Patient with a PMH significant for CVA back in 08/2000.  Aside from dysphagia, there are no other deficits noted.  Last carotid Doppler study performed in 08/2020 revealed minor carotid atherosclerosis with no hemodynamically significant ICA stenosis; degree of narrowing <50% bilaterally. TTE performed on 03/15/2021 revealed globally normal systolic function (LVEF 55 to 60%) with trivial to mild valvular insufficiency (see full interpretation of cardiovascular testing below).  Patient on GDMT for her HTN diagnosis. Blood pressure well controlled at 120/70 on currently prescribed diuretic and beta-blocker therapies.  Patient is not currently on any type of lipid-lowering therapies.  Patient with an OSAH diagnosis; compliant with nocturnal PAP therapy. Functional capacity, as defined by DASI, is documented as being </= 4 METS.  No changes were made to her medication regimen.  Patient to follow-up with outpatient cardiology in 4 months or sooner if needed.  Patient is scheduled for an elective orthopedic procedure to remove hardware from the distal LEFT femur on 04/29/2021 with Dr. Hessie Knows, MD.  Given patient's past medical history significant for cardiovascular diagnoses, presurgical cardiac clearance was sought by the performing surgeon's office and PAT team. "The patient is at the lowest risk possible for perioperative cardiovascular complications with the planned procedure.  The overall risk herprocedure is low (<1%).  Currently has no evidence active and/or significant angina and/or congestive heart failure. Patient may proceed to surgery without restriction or need for further cardiovascular testing and an overall LOW risk". This patient is on daily antiplatelet therapy. She has been instructed on recommendations for holding her daily low-dose ASA for 4 days prior to her procedure  with plans to restart as soon as postoperative  bleeding risk felt to be minimized by her attending surgeon. The patient has been instructed that her last dose of her anticoagulant will be on 04/24/2021.  Patient reports previous perioperative complications with anesthesia in the past. Patient has a PMH (+) for PONV.  Order placed by PAT for patient to receive a prophylactic preoperative dose of aprepitant. In review of the available records, it is noted that patient underwent a general anesthetic course here (ASA III) in 10/2020 without documented complications.   Vitals with BMI 11/12/2020 11/12/2020 11/12/2020  Height - - -  Weight - - -  BMI - - -  Systolic [phone]  Diastolic 60 72 56  Pulse 79 75 78    Providers/Specialists:   NOTE: Primary physician provider listed below. Patient may have been seen by APP or partner within same practice.   PROVIDER ROLE / SPECIALTY LAST Fabio Bering, MD  Orthopedics (Surgeon) 04/02/2021  Juluis Pitch, MD  Primary Care Provider 02/15/2021  Serafina Royals, MD  Cardiology 04/12/2021   Allergies:  Aripiprazole, Ciprofloxacin, Mirtazapine, and Penicillins  Current Home Medications:   No current facility-administered medications for this encounter.    acetaminophen (TYLENOL) 650 MG CR tablet   acidophilus (RISAQUAD) CAPS capsule   aspirin 81 MG EC tablet   atropine 1 % ophthalmic solution   buprenorphine (BUTRANS) 7.5 MCG/HR   Calcium Carbonate-Vit D-Min (CALCIUM 600+D3 PLUS MINERALS) 600-800 MG-UNIT TABS   diclofenac Sodium (VOLTAREN) 1 % GEL   gabapentin (NEURONTIN) 800 MG tablet   Ginger, Zingiber officinalis, (GINGER ROOT) 550 MG CAPS   Glucosamine Sulfate 500 MG CAPS   lidocaine (XYLOCAINE) 5 % ointment   melatonin 5 MG TABS   Multiple Vitamins tablet   omeprazole (PRILOSEC) 20 MG capsule   ondansetron (ZOFRAN) 4 MG tablet   oxyCODONE (OXY IR/ROXICODONE) 5 MG immediate release tablet   prazosin (MINIPRESS) 2 MG capsule    QUEtiapine (SEROQUEL) 25 MG tablet   rOPINIRole (REQUIP) 1 MG tablet   sertraline (ZOLOFT) 100 MG tablet   Simethicone 180 MG CAPS   tiZANidine (ZANAFLEX) 2 MG tablet   enoxaparin (LOVENOX) 40 MG/0.4ML injection   ferrous [id number] C-folic acid (TRINSICON / FOLTRIN) capsule   furosemide (LASIX) 40 MG tablet   ketorolac (TORADOL) 10 MG tablet   polyethylene glycol (MIRALAX / GLYCOLAX) 17 g packet   raloxifene (EVISTA) 60 MG tablet   History:   Past Medical History:  Diagnosis Date   Anemia 08/04/2017   Anxiety    Carotid artery disease (HCC)    Carpal tunnel syndrome    Cataract cortical, senile    Cervical spinal cord compression (HCC)    Chicken pox    Colitis    Complication of anesthesia    Depression    Dizziness    DJD (degenerative joint disease)    Dysphagia    GERD (gastroesophageal reflux disease)    Hallux rigidus    Hemorrhoids    History of hiatal hernia    History of palpitations    EMOTIONAL PALPITATIONS   HLD (hyperlipidemia)    Hypertension    IBS (irritable bowel syndrome)    Iritis    Lactose intolerance    Migraines    Neuropathy    OCD (obsessive compulsive disorder)    OSA on CPAP    Osteoarthritis    Osteoporosis    Panic attacks    PONV (postoperative nausea and vomiting)    Psoriasis    PTSD (post-traumatic stress disorder)  Redundant colon    REM sleep behavior disorder    Rosacea    Slow transit constipation    Spondylosis    CERVICAL,SLEEPS WITH HOB ELEVATED AND WEARS NECK SUPPORT   Stroke [facility]) 08/2000   Past Surgical History:  Procedure Laterality Date   ABDOMINAL HYSTERECTOMY     BACK SURGERY     CERVICAL FUSION 2003/DECOMPRESSION 2014 LUMBAR LAM 2009   CATARACT EXTRACTION W/PHACO Right 12/08/2015   Procedure: CATARACT EXTRACTION PHACO AND INTRAOCULAR LENS PLACEMENT (IOC);  Surgeon: Birder Robson, MD;  Location: [facility] ORS;  Service: Ophthalmology;  Laterality: Right;  [place] 01:09 AP% 22.6 CDE 15.81 fluid pack  lot # [id number]   COLONOSCOPY     COLONOSCOPY WITH PROPOFOL N/A 11/15/2017   Procedure: COLONOSCOPY WITH PROPOFOL;  Surgeon: Manya Silvas, MD;  Location: [facility] ENDOSCOPY;  Service: Endoscopy;  Laterality: N/A;   ESOPHAGOGASTRODUODENOSCOPY     ESOPHAGOGASTRODUODENOSCOPY N/A 11/15/2017   Procedure: ESOPHAGOGASTRODUODENOSCOPY (EGD);  Surgeon: Manya Silvas, MD;  Location: [facility] ENDOSCOPY;  Service: Endoscopy;  Laterality: N/A;   EYE SURGERY     FOOT FUSION Bilateral    HAND SURGERY     INTRAMEDULLARY (IM) NAIL INTERTROCHANTERIC Left 11/04/2020   Procedure: INTRAMEDULLARY (IM) NAIL INTERTROCHANTRIC;  Surgeon: Hessie Knows, MD;  Location: [facility] ORS;  Service: Orthopedics;  Laterality: Left;   SPINE SURGERY     TONSILLECTOMY     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   Ref Range & Units 3 wk ago  WBC (White Blood Cell Count) 3.2 - 9.8 x10^9/L 7.3   Hemoglobin 12.0 - 15.5 g/dL 10.1 Low    Hematocrit 35.0 - 45.0 % 29.0 Low    Platelets 150 - 450 x10^9/L 177   MCV (Mean Corpuscular Volume) 80 - 98 fL 93   MCH (Mean Corpuscular Hemoglobin) 26.5 - 34.0 pg 32.4   MCHC (Mean Corpuscular Hemoglobin Concentration) 31.4 - 36.0 % 34.8   RBC (Red Blood Cell Count) 3.77 - 5.16 x10^12/L 3.12 Low    RDW-CV (Red Cell Distribution Width) 11.5 - 14.5 % 13.0   NRBC (Nucleated Red Blood Cell Count) 0 x10^9/L 0.00   NRBC % (Nucleated Red Blood Cell %) % 0.0   MPV (Mean Platelet Volume) 7.2 - 11.7 fL 11.4   Resulting Agency  [facility]  Specimen Collected: 03/26/21 20:36 Last Resulted: 03/26/21 20:48  Received  From: [place]  Result Received: 04/08/21 17:10    Ref Range & Units 3 wk ago Comments  Sodium 135 - 145 mmol/L 138    Potassium 3.5 - 5.0 mmol/L 4.5    Chloride 98 - 108 mmol/L 103    Carbon Dioxide (CO2) 21 - 30 mmol/L 27    Urea Nitrogen (BUN) 7 - 20 mg/dL 20    Creatinine 0.4 - 1.0 mg/dL 0.8    Glucose 70 - 140 mg/dL 104  Interpretive Data:  Above is the NONFASTING reference range.   Below are the FASTING reference ranges:  NORMAL:  70-99 mg/dL  PREDIABETES: 100-125 mg/dL  DIABETES:    > 125 mg/dL   Calcium 8.7 - 10.2 mg/dL 9.1    AST (Aspartate Aminotransferase) 15 - 41 U/L 23    ALT (Alanine Aminotransferase) 14 - 54 U/L 17    Bilirubin, Total 0.4 - 1.5 mg/dL 0.4    Alk Phos (Alkaline Phosphatase) 24 - 110 U/L 85    Albumin 3.5 - 4.8 g/dL 3.8    Protein, Total 6.2 - 8.1 g/dL 6.1 Low     Anion Gap 3 - 12 mmol/L 8    BUN/CREA Ratio 6 - 27 25    Glomerular Filtration Rate (eGFR)  mL/min/1.73sq m 74  CKD-EPI (2021) does not include patient's race in the calculation of eGFR. Monitoring changes of plasma creatinine and eGFR over time is useful for monitoring kidney function.  This change was made on 01/05/2021.   Resulting Agency  [facility]   Specimen Collected: 03/26/21 20:36 Last Resulted: 03/26/21 21:10  Received From: [place]  Result Received: 04/08/21 17:10    ECG: Date: 03/26/2021 Rate: 66 bpm Rhythm: normal sinus Axis (leads I and aVF): Normal Intervals: PR 166 ms. QRS 82 ms. QTc 404 ms. ST segment and T wave changes: No evidence of acute ST segment elevation or depression Comparison: Similar to previous tracing obtained on 02/04/2021 NOTE: Tracing obtained at [facility]; unable for review. Above based on cardiologist's interpretation.    IMAGING / PROCEDURES: TRANSTHORACIC ECHOCARDIOGRAM performed in 03/15/2021 LVEF 55-60% Normal left ventricular systolic function Normal right ventricular  systolic function No valvular stenosis  Trivial AR and PR Mild MR and TR No evidence of pericardial effusion  Impression and Plan:  Sandra Mcconnell has been referred for pre-anesthesia review and clearance prior to her undergoing the planned anesthetic and procedural courses. Available labs, pertinent testing, and imaging results were personally reviewed by me. This patient has been appropriately cleared by cardiology with an overall LOW risk of significant perioperative cardiovascular complications.  Based on clinical review performed today (04/23/21), barring any significant acute changes in the patient's overall condition, it is anticipated that she will be able to proceed with the planned surgical intervention. Any acute changes in clinical condition may necessitate her procedure being postponed and/or cancelled. Patient will meet with anesthesia team (MD and/or CRNA) on the day of her procedure for preoperative evaluation/assessment. Questions regarding anesthetic course will be fielded at that time.   Pre-surgical instructions were reviewed with the patient during her PAT appointment and questions were fielded by PAT clinical staff. Patient was advised that if any questions or concerns arise prior to her procedure then she should return a call to PAT and/or her surgeon's office to discuss.  Honor Loh, MSN, APRN, FNP-C, CEN [facility]  Peri-operative Services Nurse Practitioner Phone: [phone] Fax: [phone] 04/23/21 8:45 AM  NOTE: This note has been prepared using Dragon dictation software. Despite my best ability to proofread, there is always the potential that unintentional transcriptional errors may still occur from this process.

## 2021-04-28 MED ORDER — LACTATED RINGERS IV SOLN: INTRAVENOUS | Status: DC

## 2021-04-28 MED ORDER — CEFAZOLIN SODIUM-DEXTROSE 2-4 GM/100ML-% IV SOLN
2.0000 g | INTRAVENOUS | Status: AC
  Administered 2021-04-29: 2 g via INTRAVENOUS

## 2021-04-28 MED ORDER — APREPITANT 40 MG PO CAPS
40.0000 mg | ORAL_CAPSULE | Freq: Once | ORAL | Status: AC
  Administered 2021-04-29: 40 mg via ORAL

## 2021-04-28 MED ORDER — CHLORHEXIDINE GLUCONATE 0.12 % MT SOLN: 15.0000 mL | Freq: Once | OROMUCOSAL | Status: AC

## 2021-04-28 MED ORDER — ORAL CARE MOUTH RINSE: 15.0000 mL | Freq: Once | OROMUCOSAL | Status: AC

## 2021-04-29 ENCOUNTER — Ambulatory Visit: Payer: Medicare PPO | Admitting: Urgent Care

## 2021-04-29 ENCOUNTER — Ambulatory Visit
Admission: RE | Admit: 2021-04-29 | Discharge: 2021-04-29 | Disposition: A | Discharge status: Home / Self Care | Payer: Medicare PPO | Attending: Orthopedic Surgery | Admitting: Orthopedic Surgery

## 2021-04-29 ENCOUNTER — Encounter
Admission: RE | Disposition: A | Discharge status: Home / Self Care | Payer: Self-pay | Source: Home / Self Care | Attending: Orthopedic Surgery

## 2021-04-29 ENCOUNTER — Encounter: Payer: Self-pay | Admitting: Orthopedic Surgery

## 2021-04-29 ENCOUNTER — Ambulatory Visit: Payer: Medicare PPO

## 2021-04-29 ENCOUNTER — Other Ambulatory Visit: Payer: Self-pay

## 2021-04-29 DIAGNOSIS — Z7982 Long term (current) use of aspirin: Secondary | ICD-10-CM | POA: Insufficient documentation

## 2021-04-29 DIAGNOSIS — I69991 Dysphagia following unspecified cerebrovascular disease: Secondary | ICD-10-CM | POA: Diagnosis not present

## 2021-04-29 DIAGNOSIS — M1990 Unspecified osteoarthritis, unspecified site: Secondary | ICD-10-CM | POA: Insufficient documentation

## 2021-04-29 DIAGNOSIS — F429 Obsessive-compulsive disorder, unspecified: Secondary | ICD-10-CM | POA: Insufficient documentation

## 2021-04-29 DIAGNOSIS — Z79899 Other long term (current) drug therapy: Secondary | ICD-10-CM | POA: Insufficient documentation

## 2021-04-29 DIAGNOSIS — Z881 Allergy status to other antibiotic agents status: Secondary | ICD-10-CM | POA: Insufficient documentation

## 2021-04-29 DIAGNOSIS — Z791 Long term (current) use of non-steroidal anti-inflammatories (NSAID): Secondary | ICD-10-CM | POA: Diagnosis not present

## 2021-04-29 DIAGNOSIS — G4733 Obstructive sleep apnea (adult) (pediatric): Secondary | ICD-10-CM | POA: Diagnosis not present

## 2021-04-29 DIAGNOSIS — Z79810 Long term (current) use of selective estrogen receptor modulators (SERMs): Secondary | ICD-10-CM | POA: Diagnosis not present

## 2021-04-29 DIAGNOSIS — Z888 Allergy status to other drugs, medicaments and biological substances status: Secondary | ICD-10-CM | POA: Diagnosis not present

## 2021-04-29 DIAGNOSIS — F4310 Post-traumatic stress disorder, unspecified: Secondary | ICD-10-CM | POA: Diagnosis not present

## 2021-04-29 DIAGNOSIS — Y831 Surgical operation with implant of artificial internal device as the cause of abnormal reaction of the patient, or of later complication, without mention of misadventure at the time of the procedure: Secondary | ICD-10-CM | POA: Insufficient documentation

## 2021-04-29 DIAGNOSIS — E785 Hyperlipidemia, unspecified: Secondary | ICD-10-CM | POA: Insufficient documentation

## 2021-04-29 DIAGNOSIS — T8484XA Pain due to internal orthopedic prosthetic devices, implants and grafts, initial encounter: Secondary | ICD-10-CM | POA: Diagnosis present

## 2021-04-29 DIAGNOSIS — Z880 Allergy status to penicillin: Secondary | ICD-10-CM | POA: Insufficient documentation

## 2021-04-29 DIAGNOSIS — Z419 Encounter for procedure for purposes other than remedying health state, unspecified: Secondary | ICD-10-CM

## 2021-04-29 DIAGNOSIS — R1310 Dysphagia, unspecified: Secondary | ICD-10-CM | POA: Insufficient documentation

## 2021-04-29 DIAGNOSIS — I10 Essential (primary) hypertension: Secondary | ICD-10-CM | POA: Diagnosis not present

## 2021-04-29 HISTORY — DX: Obstructive sleep apnea (adult) (pediatric): G47.33

## 2021-04-29 HISTORY — DX: Disorder of arteries and arterioles, unspecified: I77.9

## 2021-04-29 HISTORY — DX: Rosacea, unspecified: L71.9

## 2021-04-29 HISTORY — DX: Lactose intolerance, unspecified: E73.9

## 2021-04-29 HISTORY — DX: Hyperlipidemia, unspecified: E78.5

## 2021-04-29 HISTORY — DX: Hallux rigidus, unspecified foot: M20.20

## 2021-04-29 HISTORY — DX: Post-traumatic stress disorder, unspecified: F43.10

## 2021-04-29 HISTORY — PX: HARDWARE REMOVAL: SHX979

## 2021-04-29 SURGERY — REMOVAL, HARDWARE
Anesthesia: General | Laterality: Left

## 2021-04-29 MED ORDER — ONDANSETRON HCL 4 MG PO TABS: 4.0000 mg | ORAL_TABLET | Freq: Four times a day (QID) | ORAL | Status: DC | PRN

## 2021-04-29 MED ORDER — NEOMYCIN-POLYMYXIN B GU 40-200000 IR SOLN
Status: AC
  Filled 2021-04-29: qty 2

## 2021-04-29 MED ORDER — LIDOCAINE HCL (PF) 1 % IJ SOLN
INTRAMUSCULAR | Status: DC | PRN
  Administered 2021-04-29: 10 mL

## 2021-04-29 MED ORDER — APREPITANT 40 MG PO CAPS
ORAL_CAPSULE | ORAL | Status: AC
  Filled 2021-04-29: qty 1

## 2021-04-29 MED ORDER — CHLORHEXIDINE GLUCONATE 0.12 % MT SOLN
OROMUCOSAL | Status: AC
  Administered 2021-04-29: 15 mL via OROMUCOSAL
  Filled 2021-04-29: qty 15

## 2021-04-29 MED ORDER — METOCLOPRAMIDE HCL 5 MG/ML IJ SOLN: 5.0000 mg | Freq: Three times a day (TID) | INTRAMUSCULAR | Status: DC | PRN

## 2021-04-29 MED ORDER — PROPOFOL 500 MG/50ML IV EMUL
INTRAVENOUS | Status: DC | PRN
  Administered 2021-04-29: 50 ug/kg/min via INTRAVENOUS
  Administered 2021-04-29 (×2): 20 mg via INTRAVENOUS

## 2021-04-29 MED ORDER — FENTANYL CITRATE (PF) 100 MCG/2ML IJ SOLN
25.0000 ug | INTRAMUSCULAR | Status: DC | PRN
  Administered 2021-04-29 (×3): 25 ug via INTRAVENOUS

## 2021-04-29 MED ORDER — CEFAZOLIN SODIUM-DEXTROSE 2-4 GM/100ML-% IV SOLN
INTRAVENOUS | Status: AC
  Filled 2021-04-29: qty 100

## 2021-04-29 MED ORDER — ONDANSETRON HCL 4 MG/2ML IJ SOLN: 4.0000 mg | Freq: Once | INTRAMUSCULAR | Status: DC | PRN

## 2021-04-29 MED ORDER — FENTANYL CITRATE (PF) 100 MCG/2ML IJ SOLN
INTRAMUSCULAR | Status: AC
  Filled 2021-04-29: qty 2

## 2021-04-29 MED ORDER — SODIUM CHLORIDE 0.9 % IV SOLN: INTRAVENOUS | Status: DC

## 2021-04-29 MED ORDER — BUPIVACAINE HCL 0.5 % IJ SOLN
INTRAMUSCULAR | Status: DC | PRN
  Administered 2021-04-29: 10 mL

## 2021-04-29 MED ORDER — METOCLOPRAMIDE HCL 10 MG PO TABS: 5.0000 mg | ORAL_TABLET | Freq: Three times a day (TID) | ORAL | Status: DC | PRN

## 2021-04-29 MED ORDER — FENTANYL CITRATE (PF) 100 MCG/2ML IJ SOLN
INTRAMUSCULAR | Status: AC
  Administered 2021-04-29: 25 ug via INTRAVENOUS
  Filled 2021-04-29: qty 2

## 2021-04-29 MED ORDER — ONDANSETRON HCL 4 MG/2ML IJ SOLN: 4.0000 mg | Freq: Four times a day (QID) | INTRAMUSCULAR | Status: DC | PRN

## 2021-04-29 MED ORDER — ONDANSETRON HCL 4 MG/2ML IJ SOLN
INTRAMUSCULAR | Status: DC | PRN
  Administered 2021-04-29: 4 mg via INTRAVENOUS

## 2021-04-29 MED ORDER — FENTANYL CITRATE (PF) 100 MCG/2ML IJ SOLN
INTRAMUSCULAR | Status: DC | PRN
  Administered 2021-04-29: 25 ug via INTRAVENOUS

## 2021-04-29 MED ORDER — NEOMYCIN-POLYMYXIN B GU 40-200000 IR SOLN
Status: DC | PRN
  Administered 2021-04-29: 2 mL

## 2021-04-29 SURGICAL SUPPLY — 34 items
APL PRP STRL LF DISP 70% ISPRP (MISCELLANEOUS) ×1
CANISTER SUCT 1200ML W/VALVE (MISCELLANEOUS) ×2 IMPLANT
CHLORAPREP W/TINT 26 (MISCELLANEOUS) ×2 IMPLANT
COVER WAND RF STERILE (DRAPES) ×2 IMPLANT
DRAPE C-ARM XRAY 36X54 (DRAPES) ×2 IMPLANT
DRAPE C-ARMOR (DRAPES) ×2 IMPLANT
ELECT REM PT RETURN 9FT ADLT (ELECTROSURGICAL) ×2
ELECTRODE REM PT RTRN 9FT ADLT (ELECTROSURGICAL) ×1 IMPLANT
GAUZE SPONGE 4X4 12PLY STRL (GAUZE/BANDAGES/DRESSINGS) ×2 IMPLANT
GAUZE XEROFORM 1X8 LF (GAUZE/BANDAGES/DRESSINGS) ×2 IMPLANT
GLOVE SURG SYN 9.0  PF PI (GLOVE) ×1
GLOVE SURG SYN 9.0 PF PI (GLOVE) ×1 IMPLANT
GLOVE SURG UNDER POLY LF SZ9 (GLOVE) ×2 IMPLANT
GOWN SRG 2XL LVL 4 RGLN SLV (GOWNS) ×1 IMPLANT
GOWN STRL NON-REIN 2XL LVL4 (GOWNS) ×2
GOWN STRL REUS W/ TWL LRG LVL3 (GOWN DISPOSABLE) ×1 IMPLANT
GOWN STRL REUS W/TWL LRG LVL3 (GOWN DISPOSABLE) ×2
HEMOVAC 400ML (MISCELLANEOUS) ×2
KIT DRAIN HEMOVAC JP 7FR 400ML (MISCELLANEOUS) ×1 IMPLANT
KIT TURNOVER KIT A (KITS) ×2 IMPLANT
MANIFOLD NEPTUNE II (INSTRUMENTS) ×2 IMPLANT
MAT ABSORB  FLUID 56X50 GRAY (MISCELLANEOUS) ×1
MAT ABSORB FLUID 56X50 GRAY (MISCELLANEOUS) ×1 IMPLANT
NEEDLE FILTER BLUNT 18X 1/2SAF (NEEDLE) ×1
NEEDLE FILTER BLUNT 18X1 1/2 (NEEDLE) ×1 IMPLANT
NS IRRIG 500ML POUR BTL (IV SOLUTION) ×2 IMPLANT
PACK HIP PROSTHESIS (MISCELLANEOUS) ×2 IMPLANT
SCALPEL PROTECTED #10 DISP (BLADE) ×4 IMPLANT
STAPLER SKIN PROX 35W (STAPLE) ×2 IMPLANT
SUT VIC AB 0 CT1 36 (SUTURE) ×4 IMPLANT
SUT VIC AB 2-0 CT1 27 (SUTURE) ×4
SUT VIC AB 2-0 CT1 TAPERPNT 27 (SUTURE) ×2 IMPLANT
SYR 5ML LL (SYRINGE) ×2 IMPLANT
TAPE MICROFOAM 4IN (TAPE) ×2 IMPLANT

## 2021-04-29 NOTE — Anesthesia Preprocedure Evaluation (Addendum)
Anesthesia Evaluation  Patient identified by MRN, date of birth, ID band Patient awake    Reviewed: Allergy & Precautions, NPO status , Patient's Chart, lab work & pertinent test results  History of Anesthesia Complications (+) PONV and history of anesthetic complications  Airway Mallampati: II       Dental   Pulmonary sleep apnea (only when lying flat) , neg COPD,           Cardiovascular hypertension, Pt. on medications (-) Past MI and (-) CHF (-) dysrhythmias (-) Valvular Problems/Murmurs     Neuro/Psych neg Seizures Anxiety Depression CVA ("eye strokes"), No Residual Symptoms    GI/Hepatic Neg liver ROS, hiatal hernia, GERD  Medicated and Controlled,  Endo/Other  neg diabetes  Renal/GU negative Renal ROS     Musculoskeletal   Abdominal   Peds  Hematology  (+) anemia ,   Anesthesia Other Findings   Reproductive/Obstetrics                            Anesthesia Physical Anesthesia Plan  ASA: 3  Anesthesia Plan: General   Post-op Pain Management:    Induction: Intravenous  PONV Risk Score and Plan: Propofol infusion and TIVA  Airway Management Planned: Nasal Cannula  Additional Equipment:   Intra-op Plan:   Post-operative Plan:   Informed Consent: I have reviewed the patients History and Physical, chart, labs and discussed the procedure including the risks, benefits and alternatives for the proposed anesthesia with the patient or authorized representative who has indicated his/her understanding and acceptance.       Plan Discussed with:   Anesthesia Plan Comments:         Anesthesia Quick Evaluation

## 2021-04-29 NOTE — Anesthesia Postprocedure Evaluation (Signed)
Anesthesia Post Note  Patient: Sandra Mcconnell  Procedure(s) Performed: HARDWARE REMOVAL, Left screw removal, distal femur (Left)  Patient location during evaluation: PACU Anesthesia Type: General Level of consciousness: awake and alert Pain management: pain level controlled Vital Signs Assessment: post-procedure vital signs reviewed and stable Respiratory status: spontaneous breathing and respiratory function stable Cardiovascular status: stable Anesthetic complications: no   No notable events documented.   Last Vitals:  Vitals:   04/29/21 1315 04/29/21 1331  BP: 106/60 113/60  Pulse: 65 65  Resp: 12 14  Temp: 36.6 C 36.5 C  SpO2: 97% 94%    Last Pain:  Vitals:   04/29/21 1331  TempSrc: Temporal  PainSc: 0-No pain                 [name]

## 2021-04-29 NOTE — H&P (Signed)
Chief Complaint  Patient presents with   Hip Pain  Discuss CT results    History of the Present Illness: Sandra Mcconnell is a 80 y.o. female here today.   The patient presents for evaluation of a complex left subtrochanteric fracture treated with IM rod and cerclage wire. She saw Rachelle Hora, PA-C last month, and has had breakage of the interlocking screw distally. This is causing her quite a bit of pain, and she comes in today to discuss removal.  The patient locates her pain to the left hip and right knee. The patient presents with a female companion who states since [place], [place] gave her an injection, her pain has improved. She states she has pain on the lateral aspect of her right hip as well, but it is not as severe as the left side. She notes Rachelle Hora had mentioned a bone stimulator.   The patient states she has a number of other health issues. She states she recently was seen in the emergency room due to another spontaneous hyphema. She has 3 appointments with eye specialists upcoming. The patient reports she is due to receive a heart monitor.   I have reviewed past medical, surgical, social and family history, and allergies as documented in the EMR.  Past Medical History: Past Medical History:  Diagnosis Date   Allergic state   Alopecia   Anemia   Anemia, unspecified 08/04/2017   Anxiety   Bloating 08/07/2017   Carotid artery occlusion  see test results   Carpal tunnel syndrome   Cataract cortical, senile   Cervical myelopathy (CMS-HCC)   Depression   DJD (degenerative joint disease)   Dysphagia, unspecified type 2015   GERD (gastroesophageal reflux disease)   Hallux rigidus   Hemorrhoids   History of chicken pox   History of stroke 08/2020   Iritis   Lactose intolerance 11/30/2017   Lactose intolerance 09/07/2017   Migraines   Nodule of kidney   OCD (obsessive compulsive disorder)   Osteoarthritis   Osteopenia   Osteoporosis, post-menopausal   Panic  attacks   Psoriasis   PTSD (post-traumatic stress disorder)   REM sleep behavior disorder   Rosacea 11/07/2017   Sleep apnea 2009  sleep sitting up   Slow transit constipation 08/04/2017   Past Surgical History: Past Surgical History:  Procedure Laterality Date   ANTERIOR FUSION CERVICAL SPINE  1017   APPLICATION CRANIAL TONGS CALIPER/STEREOTACTIC FRAME  DEC 2014   bilateral occipital nerve block 06/17/2015, 04/26/2016, 01/03/2017   cataracts 12-29-2009, 12-08-2015   COLONOSCOPY 06/08/2007  [facility] Adenomatous Polyps in 2005   COLONOSCOPY 01/28/2013  [place] Adenomatous Polyps: CBF 01/2018   COLONOSCOPY 11/15/2017  [facility] Adenomatous Polyps: No repeat recommended d/t age per RTE (dh)   EGD 10/07/2009, 11/26/2008  No repeat per PYO   EGD 11/15/2017  Dilated: No repeat per RTE   ENDOSCOPIC CARPAL TUNNEL RELEASE  DEC 2007   FOOT CAPSULE RELEASE W/ PERCUTANEOUS HEEL CORD LENGTHENING, TIBIAL TENDON TRANSFER   FOOT SURGERY Left  MAY 2004   FOOT SURGERY Right  NOVEMBER 2004   IRITIS   MIGRAINE   nerve blocks   osteophyte removal   [place]   Past Family History: Family History  Problem Relation Age of Onset   Myocardial Infarction (Heart attack) Mother   High blood pressure (Hypertension) Mother   Stroke Mother   Breast cancer Mother   High blood  pressure (Hypertension) Father   Mental illness Father   Coronary Artery Disease (Blocked arteries around heart) Father   High blood pressure (Hypertension) Sister   Glaucoma Neg Hx   Macular degeneration Neg Hx   Medications: Current Outpatient Medications Ordered in Epic  Medication Sig Dispense Refill   *simethicone oral 180 mg. takes twice daily   acetaminophen (TYLENOL) 650 MG ER tablet Take 650 mg by mouth 3 (three) times a day.    aspirin 81 MG EC tablet Take 81 mg by mouth once daily   atropine 1 % ophthalmic solution Place 1 drop into the  left eye 2 (two) times daily 2 mL 11   brimonidine (ALPHAGAN) 0.2 % ophthalmic solution Apply to eye   buprenorphine 7.5 mcg/hour PTWK Place 7.5 mcg onto the skin every 7 (seven) days   calcium carbonate-vitamin D3 (CALTRATE 600+D) 600 mg(1,500mg ) -200 unit tablet Take 1 tablet by mouth 2 (two) times daily with meals.   diclofenac (VOLTAREN) 1 % topical gel Apply topically once daily as needed   dorzolamide (TRUSOPT) 2 % ophthalmic solution Place into both eyes as directed   gabapentin (NEURONTIN) 800 MG tablet TAKE 1 TABLET (800 MG TOTAL) BY MOUTH THREE (3) TIMES A DAY. 2   glucosamine sulfate 500 mg Tab Take by mouth 3 (three) times a day   ketorolac (TORADOL) 10 mg tablet TAKE ONE TABLET BY MOUTH AS DIRECTED FORPAIN ONCE A DAY AS NEEDED FORHEADACHE. NO MORE THAN 5 TABS/MONTH 5 tablet 1   L. acidophilus-L. rhamnosus (PROBIOTIC) 15 billion cell Cap Take by mouth   lidocaine 5 % ointment Apply 5 Application topically once daily as needed   MELATONIN ORAL Take 15 mg by mouth nightly   meloxicam (MOBIC) 7.5 MG tablet Take 1 tablet by mouth once daily   multivitamin with [id number] C-Folic Acid (FOLTRIN) 638-4.6 mg capsule Take 1 capsule by mouth once daily   omeprazole (PRILOSEC) 20 MG DR capsule TAKE 1 CAPSULE BY MOUTH EVERY DAY 90 capsule 1   ondansetron (ZOFRAN) 4 MG tablet Take 1 tablet (4 mg total) by mouth 3 (three) times daily as needed for Nausea 20 tablet 1   prazosin (MINIPRESS) 2 MG capsule Take 1 capsule (2 mg total) by mouth 3 (three) times daily 270 capsule 3   prednisoLONE acetate (PRED FORTE) 1 % ophthalmic suspension Place 1 drop into the left eye 4 (four) times daily for 10 days 5 mL 0   QUEtiapine (SEROQUEL) 25 MG tablet TAKE 1 TABLET BY MOUTH NIGHTLY. 90 tablet 3   raloxifene (EVISTA) 60 mg tablet TAKE 1 TABLET BY MOUTH DAILY 90 tablet 1   rOPINIRole (REQUIP) 1 MG tablet TAKE ONE TABLET BY MOUTH EVERY DAY 90 tablet 1   sertraline (ZOLOFT) 100 MG tablet TAKE ONE TABLET BY  MOUTH TWICE DAILY 180 tablet 1   tiZANidine (ZANAFLEX) 2 MG tablet Take by mouth every 8 (eight) hours as needed   No current Epic-ordered facility-administered medications on file.   Allergies: Allergies  Allergen Reactions   Ciprofloxacin Other (See Comments)  Other Reaction: Other reaction-mouth swells   Penicillins Other (See Comments)  Other Reaction: Other reaction-mouth swells   Abilify [Aripiprazole] Diarrhea, Dizziness and Vomiting   Remeron [Mirtazapine] Diarrhea, Dizziness and Vomiting    Body mass index is 26 kg/m.  Review of Systems: A comprehensive 14 point ROS was performed, reviewed, and the pertinent orthopaedic findings are documented in the HPI.  Vitals:  04/02/21 0804  BP: 116/72  General Physical Examination:   General/Constitutional: No apparent distress: well-nourished and well developed. Eyes: Pupils equal, round with synchronous movement. Lungs: Clear to auscultation HEENT: Normal Vascular: No edema, swelling or tenderness, except as noted in detailed exam. Cardiac: Heart rate and rhythm is regular. Integumentary: No impressive skin lesions present, except as noted in detailed exam. Neuro/Psych: Normal mood and affect, oriented to person, place and time.  Musculoskeletal Examination:  On exam, pain to the left hip and knee, mild right hip tenderness lateral aspect. Unequal leg lengths.  Radiographs:  No new imaging studies were obtained today.  Assessment: ICD-10-CM  1. Painful orthopaedic hardware (CMS-HCC) T84.84XA  2. Fracture of femur, subtrochanteric, closed, left, with nonunion, subsequent encounter S72.22XK   Plan:  he patient has clinical findings of complex left subtrochanteric fracture treated with IM rod and cerclage wire.  We discussed the patient's treatment options. I explained I believe her screw is bothering her due to protrusion, can remove distal interlocking screw. Recommend bone stimulator with nonunion on CT.  We  will schedule the patient for surgery in the near future. I explained if something needs to be done with her eye or her heart, we can schedule surgery for after that is preformed.  Surgical Risks:  The nature of the condition and the proposed procedure has been reviewed in detail with the patient. Surgical versus non-surgical options and prognosis for recovery have been reviewed and the inherent risks and benefits of each have been discussed including the risks of infection, bleeding, injury to nerves/blood vessels/tendons, incomplete relief of symptoms, persisting pain and/or stiffness, loss of function, complex regional pain syndrome, failure of the procedure, as appropriate.  Attestation: I, Dawn Royse, am documenting for [employer], MD utilizing [place].    Electronically signed by Lauris Poag, MD at 04/04/2021 7:46 PM EDT  Reviewed  H+P. No changes noted.

## 2021-04-29 NOTE — Transfer of Care (Signed)
Immediate Anesthesia Transfer of Care Note  Patient: Sandra Mcconnell  Procedure(s) Performed: HARDWARE REMOVAL, Left screw removal, distal femur (Left)  Patient Location: PACU  Anesthesia Type:MAC  Level of Consciousness: awake and alert   Airway & Oxygen Therapy: Patient Spontanous Breathing and Patient connected to face mask oxygen  Post-op Assessment: Report given to RN and Post -op Vital signs reviewed and stable  Post vital signs: Reviewed and stable  Last Vitals:  Vitals Value Taken Time  BP 108/60 04/29/21 1236  Temp 37.2 C 04/29/21 1236  Pulse 63 04/29/21 1241  Resp 13 04/29/21 1241  SpO2 97 % 04/29/21 1241  Vitals shown include unvalidated device data.  Last Pain:  Vitals:   04/29/21 1017  TempSrc: Temporal  PainSc: 7       Patients Stated Pain Goal: 3 (09/81/19 1478)  Complications: No notable events documented.

## 2021-04-29 NOTE — Op Note (Signed)
04/29/2021  12:40 PM  PATIENT:  Sandra Mcconnell  80 y.o. female  PRE-OPERATIVE DIAGNOSIS:  Painful orthopaedic hardware T84.84XA Fracture of femur, subtrochanteric, closed, left, with nonunion, subsequent encounter S72.22XK Left screw removal, distal femur  POST-OPERATIVE DIAGNOSIS:  Painful orthopaedic hardware T84.84XA Fracture of femur, subtrochanteric, closed, left, with nonunion, subsequent encounter S72.22XK Left screw removal, distal femur  PROCEDURE:  Procedure(s): HARDWARE REMOVAL, Left screw removal, distal femur (Left)  SURGEON: Laurene Footman, MD  ASSISTANTS: None  ANESTHESIA:   local and MAC  EBL:  Total I/O In: 200 [I.V.:200] Out: 5 [Blood:5]  BLOOD ADMINISTERED:none  DRAINS: none   LOCAL MEDICATIONS USED:  MARCAINE    and XYLOCAINE   SPECIMEN:  No Specimen  DISPOSITION OF SPECIMEN:  N/A  COUNTS:  YES  TOURNIQUET:  * Missing tourniquet times found for documented tourniquets in log: [id number] *  IMPLANTS: None  DICTATION: .Dragon Dictation patient was brought to the operating room and after sedation was given and appropriate patient identification and timeout procedure completed 20 cc of local anesthetic was infiltrated in the area of the planned incision after prepping the skin with alcohol.  This was a 50-50 mix of 1% Xylocaine half percent Sensorcaine plain.  The leg was then prepped and draped in the usual sterile fashion and C arm was brought in with a small incision made incorporating the prior incision subcutaneous tissue was spread splitting the IT band until the head of the screw could be palpated with with tip of a hemostat.  After this was opened up the screwdriver from the affixes set was placed tightened to grasp that screw head and the screw was removed without difficulty.  The wound is irrigated and then closed with 2-0 Vicryl subcutaneously and skin staples dressings were applied with Xeroform 4 x 4 ABD and foam tape with a C arm view showing the  broken half of the screw removed from the lateral side.  Residual screw was left as it would be very difficult to get from the medial approach and was not symptomatic  PLAN OF CARE: Discharge to home after PACU  PATIENT DISPOSITION:  PACU - hemodynamically stable.

## 2021-04-29 NOTE — Discharge Instructions (Addendum)
Weightbearing as tolerated to left leg. Keep dressing clean and dry Call office if you have [place]   The drugs that you were given will stay in your system until tomorrow so for the next 24 hours you should not:  Drive an automobile Make any legal decisions Drink any alcoholic beverage   You may resume regular meals tomorrow.  Today it is better to start with liquids and gradually work up to solid foods.  You may eat anything you prefer, but it is better to start with liquids, then soup and crackers, and gradually work up to solid foods.   Please notify your doctor immediately if you have any unusual bleeding, trouble breathing, redness and pain at the surgery site, drainage, fever, or pain not relieved by medication.    Additional Instructions:        Please contact your physician with any problems or Same Day Surgery at [phone], Monday through Friday 6 am to 4 pm, or [place] at [facility] number at [phone].

## 2021-05-11 DIAGNOSIS — G453 Amaurosis fugax: Secondary | ICD-10-CM | POA: Insufficient documentation

## 2021-05-11 DIAGNOSIS — H34211 Partial retinal artery occlusion, right eye: Secondary | ICD-10-CM | POA: Insufficient documentation

## 2021-06-07 ENCOUNTER — Other Ambulatory Visit: Payer: Self-pay

## 2021-06-07 ENCOUNTER — Ambulatory Visit: Payer: Medicare PPO | Admitting: Dermatology

## 2021-06-07 DIAGNOSIS — L918 Other hypertrophic disorders of the skin: Secondary | ICD-10-CM

## 2021-06-07 DIAGNOSIS — D229 Melanocytic nevi, unspecified: Secondary | ICD-10-CM

## 2021-06-07 DIAGNOSIS — L245 Irritant contact dermatitis due to other chemical products: Secondary | ICD-10-CM

## 2021-06-07 DIAGNOSIS — D1800 Hemangioma unspecified site: Secondary | ICD-10-CM

## 2021-06-07 DIAGNOSIS — L719 Rosacea, unspecified: Secondary | ICD-10-CM | POA: Diagnosis not present

## 2021-06-07 DIAGNOSIS — Z1283 Encounter for screening for malignant neoplasm of skin: Secondary | ICD-10-CM

## 2021-06-07 DIAGNOSIS — S40262A Insect bite (nonvenomous) of left shoulder, initial encounter: Secondary | ICD-10-CM | POA: Diagnosis not present

## 2021-06-07 DIAGNOSIS — W57XXXA Bitten or stung by nonvenomous insect and other nonvenomous arthropods, initial encounter: Secondary | ICD-10-CM | POA: Diagnosis not present

## 2021-06-07 DIAGNOSIS — L853 Xerosis cutis: Secondary | ICD-10-CM

## 2021-06-07 DIAGNOSIS — L814 Other melanin hyperpigmentation: Secondary | ICD-10-CM

## 2021-06-07 DIAGNOSIS — D2272 Melanocytic nevi of left lower limb, including hip: Secondary | ICD-10-CM

## 2021-06-07 DIAGNOSIS — L821 Other seborrheic keratosis: Secondary | ICD-10-CM

## 2021-06-07 DIAGNOSIS — L578 Other skin changes due to chronic exposure to nonionizing radiation: Secondary | ICD-10-CM

## 2021-06-07 MED ORDER — METRONIDAZOLE 0.75 % EX CREA: TOPICAL_CREAM | CUTANEOUS | 3 refills | Status: AC

## 2021-06-07 NOTE — Progress Notes (Signed)
New Patient Visit  Subjective  Sandra Mcconnell is a 80 y.o. female who presents for the following: New Patient (Initial Visit) (Patient here today for tbse. She has been here in past but it has been over 3 years. Patient reports a peeling spot at chin. Patient has a reaction to pain patch on skin. Patient had to stop using patches. Patient states was told by cardiologist to wear a heart monitor patch due to some heart problems. She has concerns her skin will not tolerate patch. ).  Patient has a history of psoriasis. Not currently on medication or treatment.   Patient here for full body skin exam and skin cancer screening.   The following portions of the chart were reviewed this encounter and updated as appropriate:        Objective  Well appearing patient in no apparent distress; mood and affect are within normal limits.  A full examination was performed including scalp, head, eyes, ears, nose, lips, neck, chest, axillae, abdomen, back, buttocks, bilateral upper extremities, bilateral lower extremities, hands, feet, fingers, toes, fingernails, and toenails. All findings within normal limits unless otherwise noted below.  mid face and chin Mild erythema with crusted papule on chin, nose clear today at exam   left posterior shoulder Pink scaly papules on left posterior shoulder  left medial thigh 1 mm medium dark brown macule   Chest Clear today, patch on R chest  bilateral legs Dry skin    Assessment & Plan  Rosacea mid face and chin  Rosacea is a chronic progressive skin condition usually affecting the face of adults, causing redness and/or acne bumps. It is treatable but not curable. It sometimes affects the eyes (ocular rosacea) as well. It may respond to topical and/or systemic medication and can flare with stress, sun exposure, alcohol, exercise and some foods.  Daily application of broad spectrum spf 30+ sunscreen to face is recommended to reduce flares.  Start  metronidazole 0.75 % cream - apply topically to aa's of face 1 - 2 times daily.  3 rfs sent to [facility]     metroNIDAZOLE (METROCREAM) 0.75 % cream - mid face and chin Apply topically See admin instructions. Apply to affected areas of face 1 - 2 times daily  Bug bite without infection, initial encounter left posterior shoulder  Resolving Instructed patient she can continue using fluticasone spray qd/bid for bug bites or itchy rash   Nevus left medial thigh  Benign-appearing.  Observation.  Call clinic for new or changing lesions.  Recommend daily use of broad spectrum spf 30+ sunscreen to sun-exposed areas.    Irritant contact dermatitis due to other chemical products Chest  By history, patient has reaction to adhesive from pain patches and other adhesive patches- probable contact dermatitis (allergic vrs irritant)  Pt has method to prevent rash with pretreating skin with fluticasone nasal spray, then applying patch.  She rotates patch to different location everyday.  She will continue this regimen since it has been preventing flares.  She has had reaction in past to other patches used for heart monitoring Recommend that she avoid placing adhesive patches on one location on skin for any extended period of time  Xerosis cutis bilateral legs  Recommend mild soap and moisturizing cream 1-2 times daily.  Gentle skin care handout provided. Samples given   Lentigines - Scattered tan macules - Due to sun exposure - Benign-appering, observe - Recommend daily broad spectrum sunscreen SPF 30+ to sun-exposed areas, reapply every  2 hours as needed. - Call for any changes  Seborrheic Keratoses - Stuck-on, waxy, tan-brown papules and/or plaques  - Benign-appearing - Discussed benign etiology and prognosis. - Observe - Call for any changes  Acrochordons (Skin Tags) - Fleshy, skin-colored pedunculated papules under breast  - Benign appearing.  - Observe. - If desired,  they can be removed with an in office procedure that is not covered by insurance. - Please call the clinic if you notice any new or changing lesions.  Melanocytic Nevi - Tan-brown and/or pink-flesh-colored symmetric macules and papules - Benign appearing on exam today - Observation - Call clinic for new or changing moles - Recommend daily use of broad spectrum spf 30+ sunscreen to sun-exposed areas.   Hemangiomas - Red papules - Discussed benign nature - Observe - Call for any changes  Actinic Damage - Chronic condition, secondary to cumulative UV/sun exposure - diffuse scaly erythematous macules with underlying dyspigmentation - Recommend daily broad spectrum sunscreen SPF 30+ to sun-exposed areas, reapply every 2 hours as needed.  - Staying in the shade or wearing long sleeves, sun glasses (UVA+UVB protection) and wide brim hats (4-inch brim around the entire circumference of the hat) are also recommended for sun protection.  - Call for new or changing lesions.  Skin cancer screening performed today.  Return if symptoms worsen or fail to improve.  I, Ruthell Rummage, CMA, am acting as scribe for Brendolyn Patty, MD.  Documentation: I have reviewed the above documentation for accuracy and completeness, and I agree with the above.  Brendolyn Patty MD

## 2021-06-07 NOTE — Patient Instructions (Addendum)
Rosacea is a chronic progressive skin condition usually affecting the face of adults, causing redness and/or acne bumps. It is treatable but not curable. It sometimes affects the eyes (ocular rosacea) as well. It may respond to topical and/or systemic medication and can flare with stress, sun exposure, alcohol, exercise and some foods.  Daily application of broad spectrum spf 30+ sunscreen to face is recommended to reduce flares.   Dry Skin Care  What causes dry skin?  Dry skin is common and results from inadequate moisture in the outer skin layers. Dry skin usually results from the excessive loss of moisture from the skin surface. This occurs due to two major factors: Normally the skin's oil glands deposit a layer of oil on the skin's surface. This layer of oil prevents the loss of moisture from the skin. Exposure to soaps, cleaners, solvents, and disinfectants removes this oily film, allowing water to escape. Water loss from the skin increases when the humidity is low. During winter months we spend a lot of time indoors where the air is heated. Heated air has very low humidity. This also contributes to dry skin.  A tendency for dry skin may accompany such disorders as eczema. Also, as people age, the number of functioning oil glands decreases, and the tendency toward dry skin can be a sensation of skin tightness when emerging from the shower.  How do I manage dry skin?  Humidify your environment. This can be accomplished by using a humidifier in your bedroom at night during winter months. Bathing can actually put moisture back into your skin if done right. Take the following steps while bathing to sooth dry skin: Avoid hot water, which only dries the skin and makes itching worse. Use warm water. Avoid washcloths or extensive rubbing or scrubbing. Use mild soaps like unscented Dove, Oil of Olay, Cetaphil, Basis, or CeraVe. If you take baths rather than showers, rinse off soap residue with clean  water before getting out of tub. Once out of the shower/tub, pat dry gently with a soft towel. Leave your skin damp. While still damp, apply any medicated ointment/cream you were prescribed to the affected areas. After you apply your medicated ointment/cream, then apply your moisturizer to your whole body.This is the most important step in dry skin care. If this is omitted, your skin will continue to be dry. The choice of moisturizer is also very important. In general, lotion will not provider enough moisture to severely dry skin because it is water based. You should use an ointment or cream. Moisturizers should also be unscented. Good choices include Vaseline (plain petrolatum), Aquaphor, Cetaphil, CeraVe, Vanicream, DML Forte, Aveeno moisture, or Eucerin Cream. Bath oils can be helpful, but do not replace the application of moisturizer after the bath. In addition, they make the tub slippery causing an increased risk for falls. Therefore, we do not recommend their use.    Melanoma ABCDEs  Melanoma is the most dangerous type of skin cancer, and is the leading cause of death from skin disease.  You are more likely to develop melanoma if you: Have light-colored skin, light-colored eyes, or red or blond hair Spend a lot of time in the sun Tan regularly, either outdoors or in a tanning bed Have had blistering sunburns, especially during childhood Have a close family member who has had a melanoma Have atypical moles or large birthmarks  Early detection of melanoma is key since treatment is typically straightforward and cure rates are extremely high if we catch it  early.   The first sign of melanoma is often a change in a mole or a new dark spot.  The ABCDE system is a way of remembering the signs of melanoma.  A for asymmetry:  The two halves do not match. B for border:  The edges of the growth are irregular. C for color:  A mixture of colors are present instead of an even brown color. D for  diameter:  Melanomas are usually (but not always) greater than 39m - the size of a pencil eraser. E for evolution:  The spot keeps changing in size, shape, and color.  Please check your skin once per month between visits. You can use a small mirror in front and a large mirror behind you to keep an eye on the back side or your body.   If you see any new or changing lesions before your next follow-up, please call to schedule a visit.  Please continue daily skin protection including broad spectrum sunscreen SPF 30+ to sun-exposed areas, reapplying every 2 hours as needed when you're outdoors.    If you have any questions or concerns for your doctor, please call our main line at [phone] press option 4 to reach your doctor's medical assistant. If no one answers, please leave a voicemail as directed and we will return your call as soon as possible. Messages left after 4 pm will be answered the following business day.   You may also send [place] message via [place] We typically respond to MyChart messages within 1-2 business days.  For prescription refills, please ask your pharmacy to contact our office. Our fax number is [phone]  If you have an urgent issue when the clinic is closed that cannot wait until the next business day, you can page your doctor at the number below.    Please note that while we do our best to be available for urgent issues outside of office hours, we are not available 24/7.   If you have an urgent issue and are unable to reach [place] you may choose to seek medical care at your doctor's office, retail clinic, urgent care center, or emergency room.  If you have a medical emergency, please immediately call 911 or go to the emergency department.  Pager Numbers  - Dr. KNehemiah Massed [phone] - Dr. MLaurence Ferrari [phone] - Dr. SNicole Kindred [phone] In the event of inclement weather, please call our main line at [phone] an update on the status of any delays  or closures.  Dermatology Medication Tips: Please keep the boxes that topical medications come in in order to help keep track of the instructions about where and how to use these. Pharmacies typically print the medication instructions only on the boxes and not directly on the medication tubes.   If your medication is too expensive, please contact our office at [phone] 4 or send [place] message through [place]   We are unable to tell what your co-pay for medications will be in advance as this is different depending on your insurance coverage. However, we may be able to find a substitute medication at lower cost or fill out paperwork to get insurance to cover a needed medication.   If a prior authorization is required to get your medication covered by your insurance company, please allow u[place]1-2 business days to complete this process.  Drug prices often vary depending on where the prescription is filled and some pharmacies may offer cheaper prices.  The  website www.goodrx.com contains coupons for medications through different pharmacies. The prices here do not account for what the cost may be with help from insurance (it may be cheaper with your insurance), but the website can give you the price if you did not use any insurance.  - You can print the associated coupon and take it with your prescription to the pharmacy.  - You may also stop by our office during regular business hours and pick up a GoodRx coupon card.  - If you need your prescription sent electronically to a different pharmacy, notify our office through [facility] or by phone at [phone] option 4.

## 2021-06-10 DIAGNOSIS — R55 Syncope and collapse: Secondary | ICD-10-CM | POA: Insufficient documentation

## 2021-06-30 DIAGNOSIS — Z95818 Presence of other cardiac implants and grafts: Secondary | ICD-10-CM | POA: Insufficient documentation

## 2021-10-16 IMAGING — CT CT HIP*L* W/O CM
2 of 6 series · 14 of 46 positions shown, 16 images · non-contrast
Comparison: Fluoroscopic images 11/04/2020

CLINICAL DATA: Following [place], broke her left hip had surgery
noted screw in her hip was broken

EXAM:
CT OF THE LEFT HIP WITHOUT CONTRAST
TECHNIQUE: Multidetector CT imaging of the left hip was performed according to
the standard protocol. Multiplanar CT image reconstructions were
also generated.

[Series 4: axial st pelvis/hip 2.00 · axial · 0.43mm/px · z∈[-1646,-1334]mm · 11 of 180 slices shown, 13 images]
[im 12/180  soft-tissue]
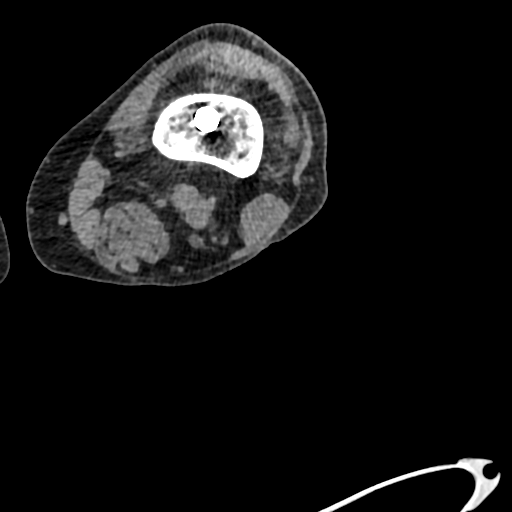
[im 12/180  bone]
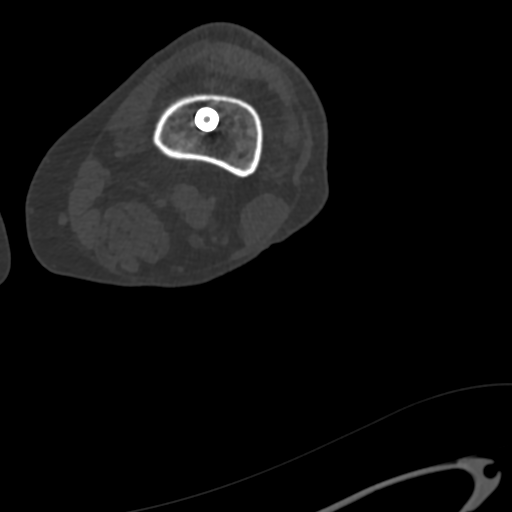
[im 24/180  soft-tissue]
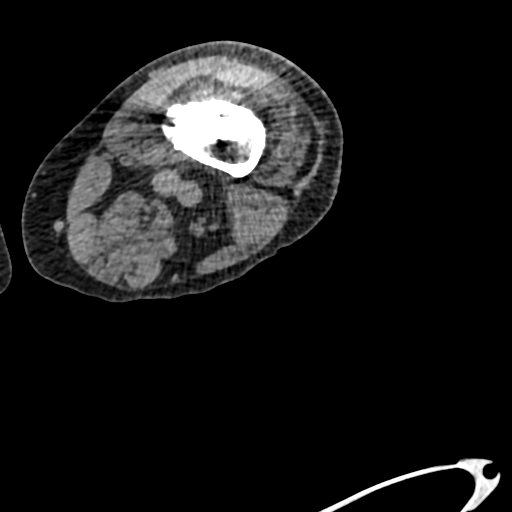
[im 48/180  soft-tissue]
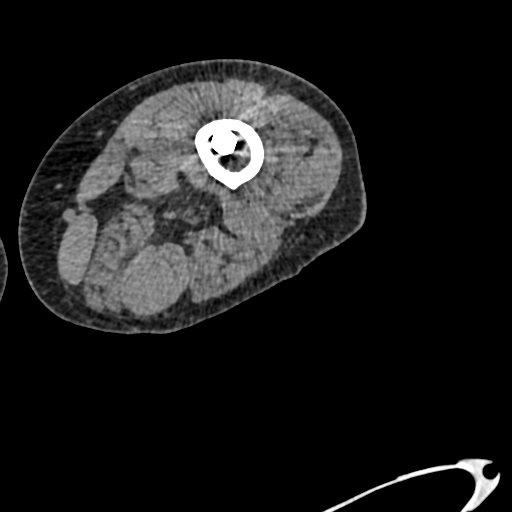
[im 60/180  soft-tissue]
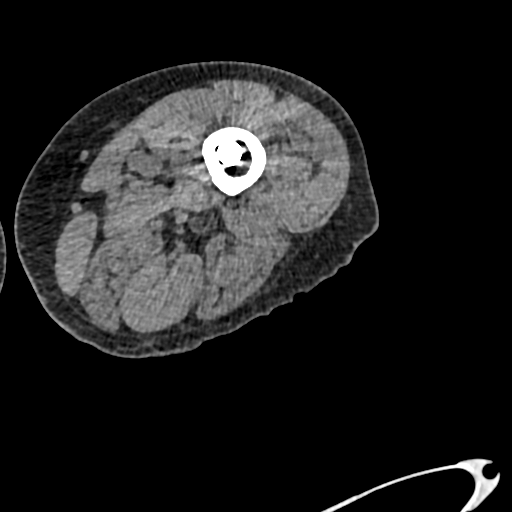
[im 72/180  soft-tissue]
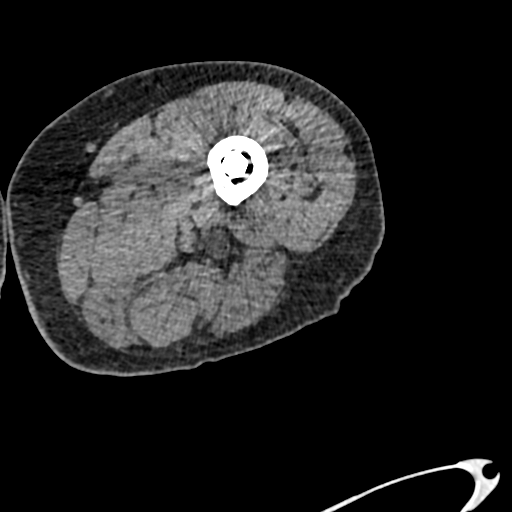
[im 96/180  soft-tissue]
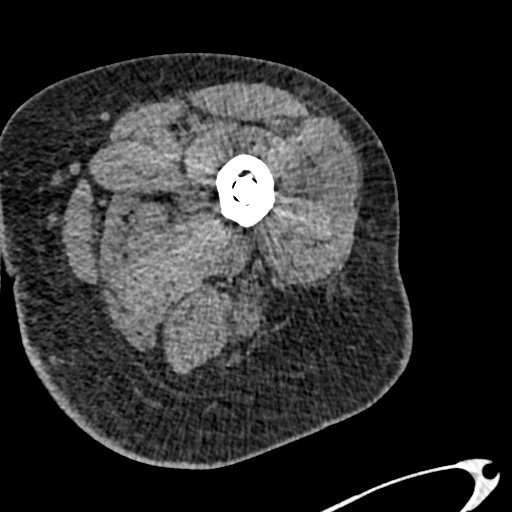
[im 108/180  soft-tissue]
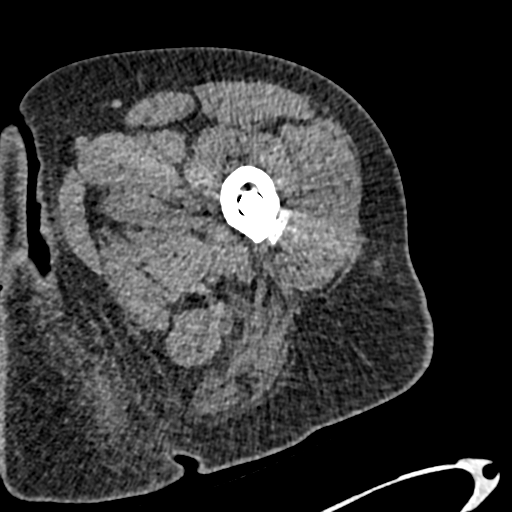
[im 120/180  soft-tissue]
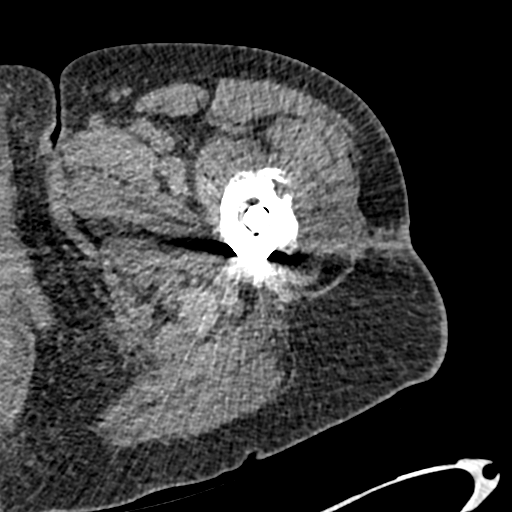
[im 132/180  soft-tissue]
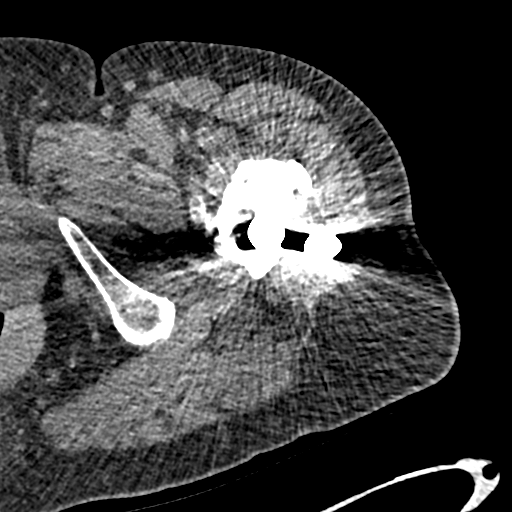
[im 132/180  bone]
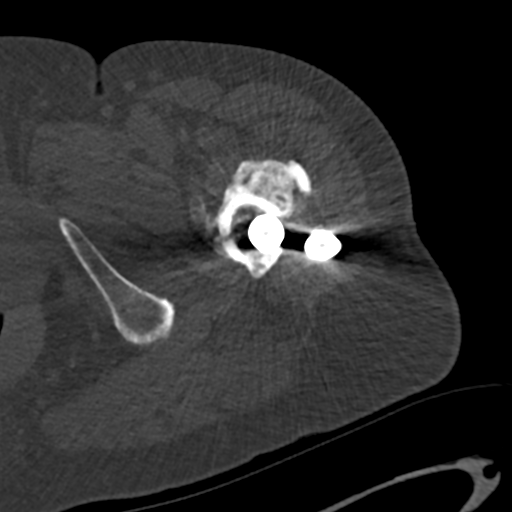
[im 156/180  soft-tissue]
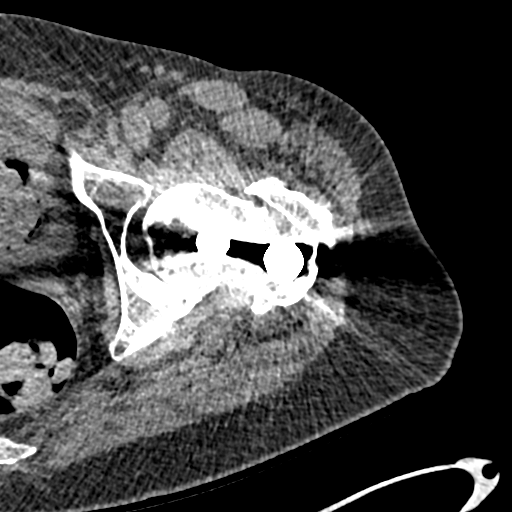
[im 168/180  soft-tissue]
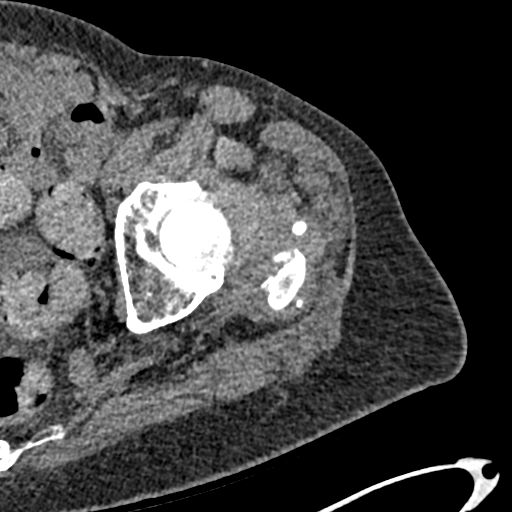

[Series 8: cor st pelvis/hip 2.00 cor · coronal · 0.40mm/px · 3 of 119 slices shown]
[im 30/119  soft-tissue]
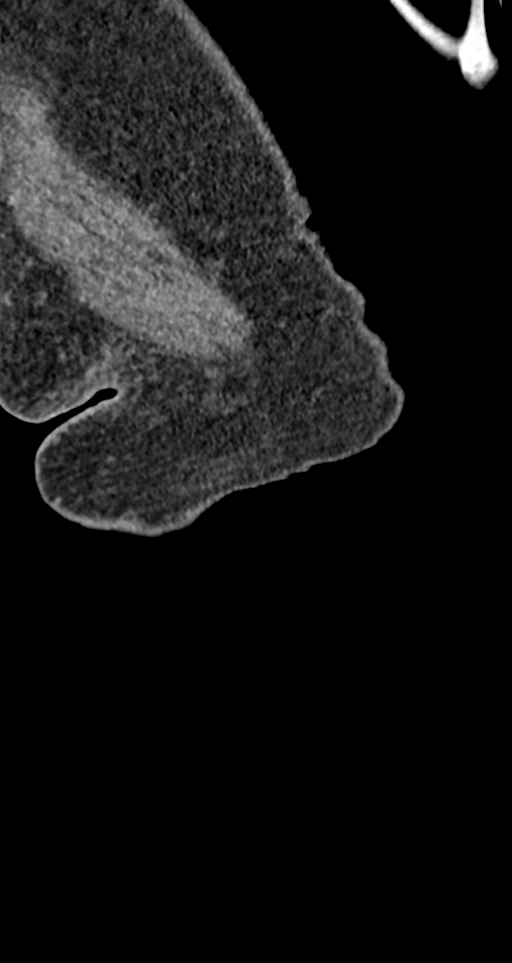
[im 60/119  soft-tissue]
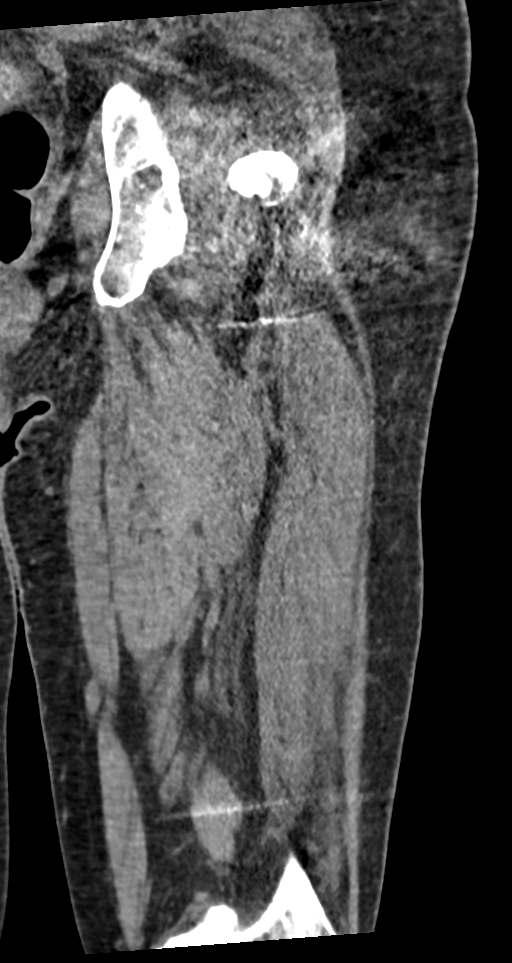
[im 89/119  soft-tissue]
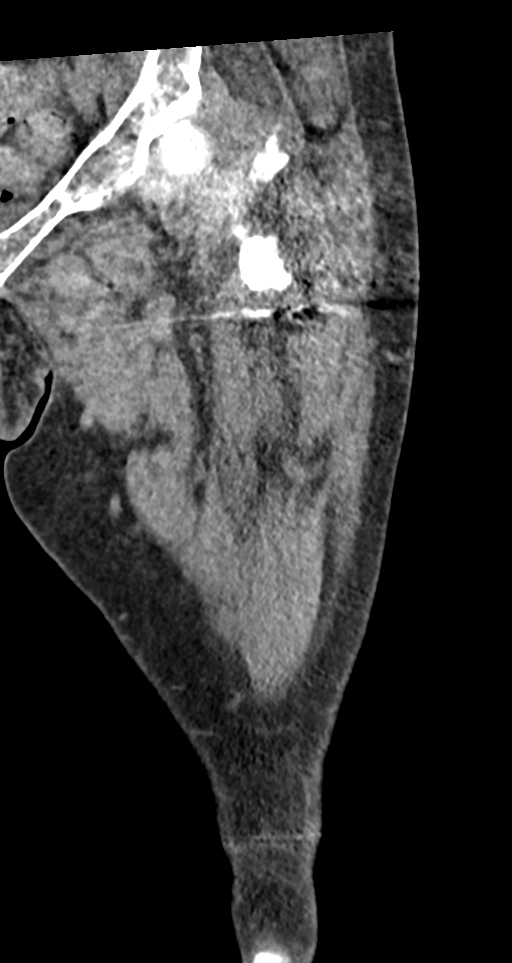

[14 of 46 positions shown; findings below may reference images not displayed]

FINDINGS: Bones/Joint/Cartilage

There is a subacute, comminuted intertrochanteric fracture with
subtrochanteric extension, with intramedullary rod/dynamic proximal
screw and cerclage wire fixation and distal interlocking screw which
appears fractured and angulated. There is callus formation
proximally with some bridging anteriorly but otherwise incompletely
healed fracture margins. There is periosteal reaction/callus
formation along the and distal femur at the distal fractured screw,
but no acute fracture of the distal femur identified. Small left hip
joint effusion.

Ligaments

Suboptimally assessed by CT.

Muscles and Tendons

And intramuscular edema along the left hip. Mild gluteus medius
atrophy.

Soft tissues

Persistent swelling along the left hip.
IMPRESSION: Comminuted intertrochanteric/subtrochanteric proximal femur fracture
with intramedullary rod and proximal cerclage wire fixation.
Fractured distal interlocking screw with adjacent periosteal change
but no evidence of acute distal femur fracture.

Small amount of bridging callus anteriorly with otherwise
incompletely healed proximal femur fracture margins.

## 2022-03-03 LAB — HM DEXA SCAN

## 2022-06-14 ENCOUNTER — Ambulatory Visit: Payer: Medicare PPO | Admitting: Dermatology

## 2022-06-14 DIAGNOSIS — L719 Rosacea, unspecified: Secondary | ICD-10-CM

## 2022-06-14 DIAGNOSIS — D229 Melanocytic nevi, unspecified: Secondary | ICD-10-CM

## 2022-06-14 DIAGNOSIS — Z1283 Encounter for screening for malignant neoplasm of skin: Secondary | ICD-10-CM

## 2022-06-14 DIAGNOSIS — D2272 Melanocytic nevi of left lower limb, including hip: Secondary | ICD-10-CM

## 2022-06-14 DIAGNOSIS — L249 Irritant contact dermatitis, unspecified cause: Secondary | ICD-10-CM

## 2022-06-14 DIAGNOSIS — L905 Scar conditions and fibrosis of skin: Secondary | ICD-10-CM

## 2022-06-14 DIAGNOSIS — L821 Other seborrheic keratosis: Secondary | ICD-10-CM

## 2022-06-14 DIAGNOSIS — D1800 Hemangioma unspecified site: Secondary | ICD-10-CM

## 2022-06-14 DIAGNOSIS — L578 Other skin changes due to chronic exposure to nonionizing radiation: Secondary | ICD-10-CM

## 2022-06-14 DIAGNOSIS — L820 Inflamed seborrheic keratosis: Secondary | ICD-10-CM

## 2022-06-14 DIAGNOSIS — L814 Other melanin hyperpigmentation: Secondary | ICD-10-CM

## 2022-06-14 NOTE — Patient Instructions (Addendum)
Continue metronidazole 0.75% cream - once to twice a day to face for rosacea.   Continue fluticasone cream - apply to itchy rash/irritation from patch twice a day until improved. Avoid face, groin, underarms.   Rosacea  What is rosacea? Rosacea (say: ro-zay-sha) is a common skin disease that usually begins as a trend of flushing or blushing easily.  As rosacea progresses, a persistent redness in the center of the face will develop and may gradually spread beyond the nose and cheeks to the forehead and chin.  In some cases, the ears, chest, and back could be affected.  Rosacea may appear as tiny blood vessels or small red bumps that occur in crops.  Frequently they can contain pus, and are called "pustules".  If the bumps do not contain pus, they are referred to as "papules".  Rarely, in prolonged, untreated cases of rosacea, the oil glands of the nose and cheeks may become permanently enlarged.  This is called rhinophyma, and is seen more frequently in men.  Signs and Risks In its beginning stages, rosacea tends to come and go, which makes it difficult to recognize.  It can start as intermittent flushing of the face.  Eventually, blood vessels may become permanently visible.  Pustules and papules can appear, but can be mistaken for adult acne.  People of all races, ages, genders and ethnic groups are at risk of developing rosacea.  However, it is more common in women (especially around menopause) and adults with fair skin between the ages of 32 and 56.  Treatment Dermatologists typically recommend a combination of treatments to effectively manage rosacea.  Treatment can improve symptoms and may stop the progression of the rosacea.  Treatment may involve both topical and oral medications.  The tetracycline antibiotics are often used for their anti-inflammatory effect; however, because of the possibility of developing antibiotic resistance, they should not be used long term at full dose.  For dilated  blood vessels the options include electrodessication (uses electric current through a small needle), laser treatment, and cosmetics to hide the redness.   With all forms of treatment, improvement is a slow process, and patients may not see any results for the first 3-4 weeks.  It is very important to avoid the sun and other triggers.  Patients must wear sunscreen daily.  Skin Care Instructions: Cleanse the skin with a mild soap such as CeraVe cleanser, Cetaphil cleanser, or Dove soap once or twice daily as needed. Moisturize with Eucerin Redness Relief Daily Perfecting Lotion (has a subtle green tint), CeraVe Moisturizing Cream, or Oil of Olay Daily Moisturizer with sunscreen every morning and/or night as recommended. Makeup should be "non-comedogenic" (won't clog pores) and be labeled "for sensitive skin". Good choices for cosmetics are: Neutrogena, Almay, and Physician's Formula.  Any product with a green tint tends to offset a red complexion. If your eyes are dry and irritated, use artificial tears 2-3 times per day and cleanse the eyelids daily with baby shampoo.  Have your eyes examined at least every 2 years.  Be sure to tell your eye doctor that you have rosacea. Alcoholic beverages tend to cause flushing of the skin, and may make rosacea worse. Always wear sunscreen, protect your skin from extreme hot and cold temperatures, and avoid spicy foods, hot drinks, and mechanical irritation such as rubbing, scrubbing, or massaging the face.  Avoid harsh skin cleansers, cleansing masks, astringents, and exfoliation. If a particular product burns or makes your face feel tight, then it is likely  to flare your rosacea. If you are having difficulty finding a sunscreen that you can tolerate, you may try switching to a chemical-free sunscreen.  These are ones whose active ingredient is zinc oxide or titanium dioxide only.  They should also be fragrance free, non-comedogenic, and labeled for sensitive  skin. Rosacea triggers may vary from person to person.  There are a variety of foods that have been reported to trigger rosacea.  Some patients find that keeping a diary of what they were doing when they flared helps them avoid triggers.   Seborrheic Keratosis  What causes seborrheic keratoses? Seborrheic keratoses are harmless, common skin growths that first appear during adult life.  As time goes by, more growths appear.  Some people may develop a large number of them.  Seborrheic keratoses appear on both covered and uncovered body parts.  They are not caused by sunlight.  The tendency to develop seborrheic keratoses can be inherited.  They vary in color from skin-colored to gray, brown, or even black.  They can be either smooth or have a rough, warty surface.   Seborrheic keratoses are superficial and look as if they were stuck on the skin.  Under the microscope this type of keratosis looks like layers upon layers of skin.  That is why at times the top layer may seem to fall off, but the rest of the growth remains and re-grows.    Treatment Seborrheic keratoses do not need to be treated, but can easily be removed in the office.  Seborrheic keratoses often cause symptoms when they rub on clothing or jewelry.  Lesions can be in the way of shaving.  If they become inflamed, they can cause itching, soreness, or burning.  Removal of a seborrheic keratosis can be accomplished by freezing, burning, or surgery. If any spot bleeds, scabs, or grows rapidly, please return to have it checked, as these can be an indication of a skin cancer.   Due to recent changes in healthcare laws, you may see results of your pathology and/or laboratory studies on MyChart before the doctors have had a chance to review them. We understand that in some cases there may be results that are confusing or concerning to you. Please understand that not all results are received at the same time and often the doctors may need to  interpret multiple results in order to provide you with the best plan of care or course of treatment. Therefore, we ask that you please give [place] 2 business days to thoroughly review all your results before contacting the office for clarification. Should we see a critical lab result, you will be contacted sooner.   If You Need Anything After Your Visit  If you have any questions or concerns for your doctor, please call our main line at [phone] and press option 4 to reach your doctor's medical assistant. If no one answers, please leave a voicemail as directed and we will return your call as soon as possible. Messages left after 4 pm will be answered the following business day.   You may also send [place] a message via [place]. We typically respond to MyChart messages within 1-2 business days.  For prescription refills, please ask your pharmacy to contact our office. Our fax number is [phone].  If you have an urgent issue when the clinic is closed that cannot wait until the next business day, you can page your doctor at the number below.    Please note that while we do our  best to be available for urgent issues outside of office hours, we are not available 24/7.   If you have an urgent issue and are unable to reach [place], you may choose to seek medical care at your doctor's office, retail clinic, urgent care center, or emergency room.  If you have a medical emergency, please immediately call 911 or go to the emergency department.  Pager Numbers  - Dr. Nehemiah Massed: [phone]  - Dr. Laurence Ferrari: [phone]  - Dr. Nicole Kindred: [phone]  In the event of inclement weather, please call our main line at [phone] for an update on the status of any delays or closures.  Dermatology Medication Tips: Please keep the boxes that topical medications come in in order to help keep track of the instructions about where and how to use these. Pharmacies typically print the medication instructions only on the  boxes and not directly on the medication tubes.   If your medication is too expensive, please contact our office at [phone] option 4 or send [place] a message through [place].   We are unable to tell what your co-pay for medications will be in advance as this is different depending on your insurance coverage. However, we may be able to find a substitute medication at lower cost or fill out paperwork to get insurance to cover a needed medication.   If a prior authorization is required to get your medication covered by your insurance company, please allow [place] 1-2 business days to complete this process.  Drug prices often vary depending on where the prescription is filled and some pharmacies may offer cheaper prices.  The website www.goodrx.com contains coupons for medications through different pharmacies. The prices here do not account for what the cost may be with help from insurance (it may be cheaper with your insurance), but the website can give you the price if you did not use any insurance.  - You can print the associated coupon and take it with your prescription to the pharmacy.  - You may also stop by our office during regular business hours and pick up a GoodRx coupon card.  - If you need your prescription sent electronically to a different pharmacy, notify our office through [facility] or by phone at [phone] option 4.     Si Usted Necesita Algo Despus de Su Visita  Tambin puede enviarnos un mensaje a travs de Pharmacist, community. Por lo general respondemos a los mensajes de MyChart en el transcurso de 1 a 2 das hbiles.  Para renovar recetas, por favor pida a su farmacia que se ponga en contacto con nuestra oficina. Harland Dingwall de fax es [place] [phone].  Si tiene un asunto urgente cuando la clnica est cerrada y que no puede esperar hasta el siguiente da hbil, puede llamar/localizar a su doctor(a) al nmero que aparece a continuacin.   Por favor, tenga en cuenta que  aunque hacemos todo lo posible para estar disponibles para asuntos urgentes fuera del horario de [place], no estamos disponibles las 24 horas del da, los 7 das de la [place].   Si tiene un problema urgente y no puede comunicarse con nosotros, puede optar por buscar atencin mdica  en el consultorio de su doctor(a), en una clnica privada, en un centro de atencin urgente o en una sala de emergencias.  Si tiene Engineering geologist, por favor llame inmediatamente al 911 o vaya a la sala de emergencias.  Nmeros de bper  - Dr. Nehemiah Massed: [phone]  - Dra. Moye: [phone]  -  Eliberto Ivory: [phone]  En caso de inclemencias del [place], por favor llame a nuestra lnea principal al [phone] para una actualizacin sobre el [place] de cualquier retraso o cierre.  Consejos para la medicacin en dermatologa: Por favor, guarde las cajas en las que vienen los medicamentos de uso tpico para ayudarle a seguir las instrucciones sobre dnde y cmo usarlos. Las farmacias generalmente imprimen las instrucciones del medicamento slo en las cajas y no directamente en los tubos del [place].   Si su medicamento es muy caro, por favor, pngase en contacto con Zigmund Daniel llamando al [phone] y presione la opcin 4 o envenos un mensaje a travs de Pharmacist, community.   No podemos decirle cul ser su copago por los medicamentos por adelantado ya que esto es diferente dependiendo de la cobertura de su seguro. Sin embargo, es posible que podamos encontrar un medicamento sustituto a Electrical engineer un formulario para que el seguro cubra el medicamento que se considera necesario.   Si se requiere una autorizacin previa para que su compaa de seguros [place] su medicamento, por favor permtanos de 1 a 2 das hbiles para completar este proceso.  Los precios de los medicamentos varan con frecuencia dependiendo del Environmental consultant de dnde se surte la receta y alguna farmacias pueden ofrecer precios ms  baratos.  El sitio web www.goodrx.com tiene cupones para medicamentos de Airline pilot. Los precios aqu no tienen en cuenta lo que podra costar con la ayuda del seguro (puede ser ms barato con su seguro), pero el sitio web puede darle el precio si no utiliz Research scientist (physical sciences).  - Puede imprimir el cupn correspondiente y llevarlo con su receta a la farmacia.  - Tambin puede pasar por nuestra oficina durante el horario de atencin regular y Charity fundraiser una tarjeta de cupones de GoodRx.  - Si necesita que su receta se enve electrnicamente a una farmacia diferente, informe a nuestra oficina a travs de MyChart de [place] o por telfono llamando al [phone] y presione la opcin 4.

## 2022-06-14 NOTE — Progress Notes (Signed)
Follow-Up Visit   Subjective  Sandra Mcconnell is a 81 y.o. female who presents for the following: Annual Exam.  The patient presents for Total-Body Skin Exam (TBSE) for skin cancer screening and mole check.  The patient has spots, moles and lesions to be evaluated, some may be new or changing. She has irritated spots to check on the flank and inframammary. She has rosacea, controlled with metronidazole 0.75% cream. Patient also has irritation on her side from Butrans pain patch, itchy. Similar rash comes up is areas wherever weekly patch is placed.   The following portions of the chart were reviewed this encounter and updated as appropriate:       Review of Systems:  No other skin or systemic complaints except as noted in HPI or Assessment and Plan.  Objective  Well appearing patient in no apparent distress; mood and affect are within normal limits.  A full examination was performed including scalp, head, eyes, ears, nose, lips, neck, chest, axillae, abdomen, back, buttocks, bilateral upper extremities, bilateral lower extremities, hands, feet, fingers, toes, fingernails, and toenails. All findings within normal limits unless otherwise noted below.  Left Post Lat Thigh 1 mm medium dark brown macule     face Clear today  Left Inframammary Erythematous stuck-on, waxy papules  Left Thigh, Left hip Linear scars with erythema  Left post Flank Round pink patch in area of previous patch placement    Assessment & Plan  Skin cancer screening performed today.  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  - Recommend staying in the shade or wearing long sleeves, sun glasses (UVA+UVB protection) and wide brim hats (4-inch brim around the entire circumference of  the hat). - Call for new or changing lesions.  Hemangiomas - Red papules - Discussed benign nature - Observe - Call for any changes  Lentigines - Scattered tan macules - Due to sun exposure - Benign-appering, observe - Recommend daily broad spectrum sunscreen SPF 30+ to sun-exposed areas, reapply every 2 hours as needed. - Call for any changes  Seborrheic Keratoses - Stuck-on, waxy, tan-brown papules and/or plaques  - Benign-appearing - Discussed benign etiology and prognosis. - Observe - Call for any changes  Nevus Left Post Lat Thigh  Benign-appearing, stable.  Observation.  Call clinic for new or changing moles.  Recommend daily use of broad spectrum spf 30+ sunscreen to sun-exposed areas.   Rosacea face  Chronic condition with duration or expected duration over one year. Currently well-controlled.   Rosacea is a chronic progressive skin condition usually affecting the face of adults, causing redness and/or acne bumps. It is treatable but not curable. It sometimes affects the eyes (ocular rosacea) as well. It may respond to topical and/or systemic medication and can flare with stress, sun exposure, alcohol, exercise and some foods.  Daily application of broad spectrum spf 30+ sunscreen to face is recommended to reduce flares.   Continue metronidazole 0.75% cream Apply to face qd/bid. Pt has and will call for refills.   Inflamed seborrheic keratosis Left Inframammary  Symptomatic, irritating, discussed treatment, but patient defers today due to having other more pressing medical issues. May consider in the future.   Scar Left Thigh, Left hip  Benign, observe.    Recommend Serica Scar Gel, info given. Apply bid to scars.  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 Condition is symptomatic/ bothersome to patient.  Secondary to Butrans pain patch.  Continue fluticasone cream Apply  to AA bid to aa after removing patch until itchy rash improved. Avoid face, groin, axilla. Pt has. Rotate patch placement  Topical steroids (such as triamcinolone, fluocinolone, fluocinonide, mometasone, clobetasol, halobetasol, betamethasone, hydrocortisone) can cause thinning and lightening of the skin if they are used for too long in the same area. Your physician has selected the right strength medicine for your problem and area affected on the body. Please use your medication only as directed by your physician to prevent side effects.      Return in about 1 year (around 06/15/2023) for TBSE.  Documentation: I have reviewed the above documentation for accuracy and completeness, and I agree with the above.  Brendolyn Patty MD

## 2022-06-20 ENCOUNTER — Other Ambulatory Visit: Payer: Self-pay

## 2022-06-20 DIAGNOSIS — L719 Rosacea, unspecified: Secondary | ICD-10-CM

## 2022-06-20 NOTE — Progress Notes (Signed)
error 

## 2022-06-30 ENCOUNTER — Telehealth: Payer: Self-pay

## 2022-06-30 ENCOUNTER — Other Ambulatory Visit: Payer: Self-pay

## 2022-06-30 NOTE — Progress Notes (Signed)
error 

## 2022-06-30 NOTE — Telephone Encounter (Signed)
Patient called today wanting a prescription for Fluticasone Cream. Patient was seen on 06/07/21 and there was mention of her using the spray as a topical, but in the note for the 06/14/22 visit of this year it states for patient to continue the Fluticasone CREAM. Patient's after visit summary also printed out for her to continue the Cream, so she called today confused because she has never been prescribed the cream, but she would like to try it if that is okay with you? Please advise.

## 2022-07-04 ENCOUNTER — Other Ambulatory Visit: Payer: Self-pay

## 2022-07-04 MED ORDER — FLUTICASONE PROPIONATE 0.05 % EX CREA: TOPICAL_CREAM | Freq: Two times a day (BID) | CUTANEOUS | 1 refills | Status: AC

## 2022-07-04 NOTE — Progress Notes (Signed)
Fluticasone cream sent in per Dr. Nicole Kindred

## 2022-07-04 NOTE — Telephone Encounter (Signed)
Medication sent in. Patient advised.

## 2023-02-22 ENCOUNTER — Other Ambulatory Visit: Payer: Self-pay | Admitting: Family Medicine

## 2023-02-22 DIAGNOSIS — Z1231 Encounter for screening mammogram for malignant neoplasm of breast: Secondary | ICD-10-CM

## 2023-03-22 ENCOUNTER — Ambulatory Visit
Admission: RE | Admit: 2023-03-22 | Discharge: 2023-03-22 | Disposition: A | Discharge status: Home / Self Care | Payer: Medicare PPO | Source: Ambulatory Visit | Attending: Family Medicine | Admitting: Family Medicine

## 2023-03-22 DIAGNOSIS — Z1231 Encounter for screening mammogram for malignant neoplasm of breast: Secondary | ICD-10-CM | POA: Diagnosis present

## 2023-06-08 ENCOUNTER — Ambulatory Visit: Payer: Medicare PPO | Admitting: Dermatology

## 2023-06-08 VITALS — BP 116/79 | HR 78

## 2023-06-08 DIAGNOSIS — W908XXA Exposure to other nonionizing radiation, initial encounter: Secondary | ICD-10-CM

## 2023-06-08 DIAGNOSIS — L720 Epidermal cyst: Secondary | ICD-10-CM

## 2023-06-08 DIAGNOSIS — L719 Rosacea, unspecified: Secondary | ICD-10-CM

## 2023-06-08 DIAGNOSIS — L814 Other melanin hyperpigmentation: Secondary | ICD-10-CM

## 2023-06-08 DIAGNOSIS — D1801 Hemangioma of skin and subcutaneous tissue: Secondary | ICD-10-CM

## 2023-06-08 DIAGNOSIS — L820 Inflamed seborrheic keratosis: Secondary | ICD-10-CM

## 2023-06-08 DIAGNOSIS — Z1283 Encounter for screening for malignant neoplasm of skin: Secondary | ICD-10-CM | POA: Diagnosis not present

## 2023-06-08 DIAGNOSIS — L821 Other seborrheic keratosis: Secondary | ICD-10-CM

## 2023-06-08 DIAGNOSIS — L578 Other skin changes due to chronic exposure to nonionizing radiation: Secondary | ICD-10-CM

## 2023-06-08 DIAGNOSIS — D220 Melanocytic nevi of lip: Secondary | ICD-10-CM

## 2023-06-08 DIAGNOSIS — L918 Other hypertrophic disorders of the skin: Secondary | ICD-10-CM

## 2023-06-08 DIAGNOSIS — D229 Melanocytic nevi, unspecified: Secondary | ICD-10-CM

## 2023-06-08 DIAGNOSIS — D2272 Melanocytic nevi of left lower limb, including hip: Secondary | ICD-10-CM

## 2023-06-08 NOTE — Patient Instructions (Addendum)
Seborrheic Keratosis  What causes seborrheic keratoses? Seborrheic keratoses are harmless, common skin growths that first appear during adult life.  As time goes by, more growths appear.  Some people may develop a large number of them.  Seborrheic keratoses appear on both covered and uncovered body parts.  They are not caused by sunlight.  The tendency to develop seborrheic keratoses can be inherited.  They vary in color from skin-colored to gray, brown, or even black.  They can be either smooth or have a rough, warty surface.   Seborrheic keratoses are superficial and look as if they were stuck on the skin.  Under the microscope this type of keratosis looks like layers upon layers of skin.  That is why at times the top layer may seem to fall off, but the rest of the growth remains and re-grows.    Treatment Seborrheic keratoses do not need to be treated, but can easily be removed in the office.  Seborrheic keratoses often cause symptoms when they rub on clothing or jewelry.  Lesions can be in the way of shaving.  If they become inflamed, they can cause itching, soreness, or burning.  Removal of a seborrheic keratosis can be accomplished by freezing, burning, or surgery. If any spot bleeds, scabs, or grows rapidly, please return to have it checked, as these can be an indication of a skin cancer.  Due to recent changes in healthcare laws, you may see results of your pathology and/or laboratory studies on MyChart before the doctors have had a chance to review them. We understand that in some cases there may be results that are confusing or concerning to you. Please understand that not all results are received at the same time and often the doctors may need to interpret multiple results in order to provide you with the best plan of care or course of treatment. Therefore, we ask that you please give [place] 2 business days to thoroughly review all your results before contacting the office for clarification. Should  we see a critical lab result, you will be contacted sooner.   If You Need Anything After Your Visit  If you have any questions or concerns for your doctor, please call our main line at [phone] and press option 4 to reach your doctor's medical assistant. If no one answers, please leave a voicemail as directed and we will return your call as soon as possible. Messages left after 4 pm will be answered the following business day.   You may also send [place] a message via MyChart. We typically respond to MyChart messages within 1-2 business days.  For prescription refills, please ask your pharmacy to contact our office. Our fax number is [phone].  If you have an urgent issue when the clinic is closed that cannot wait until the next business day, you can page your doctor at the number below.    Please note that while we do our best to be available for urgent issues outside of office hours, we are not available 24/7.   If you have an urgent issue and are unable to reach [place], you may choose to seek medical care at your doctor's office, retail clinic, urgent care center, or emergency room.  If you have a medical emergency, please immediately call 911 or go to the emergency department.  Pager Numbers  - Dr. Gwen Pounds: [phone]  - Dr. Roseanne Reno: [phone]  In the event of inclement weather, please call our main line at [phone] for an update on  the status of any delays or closures.  Dermatology Medication Tips: Please keep the boxes that topical medications come in in order to help keep track of the instructions about where and how to use these. Pharmacies typically print the medication instructions only on the boxes and not directly on the medication tubes.   If your medication is too expensive, please contact our office at [phone] option 4 or send [place] a message through MyChart.   We are unable to tell what your co-pay for medications will be in advance as this is different  depending on your insurance coverage. However, we may be able to find a substitute medication at lower cost or fill out paperwork to get insurance to cover a needed medication.   If a prior authorization is required to get your medication covered by your insurance company, please allow [place] 1-2 business days to complete this process.  Drug prices often vary depending on where the prescription is filled and some pharmacies may offer cheaper prices.  The website www.goodrx.com contains coupons for medications through different pharmacies. The prices here do not account for what the cost may be with help from insurance (it may be cheaper with your insurance), but the website can give you the price if you did not use any insurance.  - You can print the associated coupon and take it with your prescription to the pharmacy.  - You may also stop by our office during regular business hours and pick up a GoodRx coupon card.  - If you need your prescription sent electronically to a different pharmacy, notify our office through [facility] or by phone at [phone] option 4.     Si Usted Necesita Algo Despus de Su Visita  Tambin puede enviarnos un mensaje a travs de Clinical cytogeneticist. Por lo general respondemos a los mensajes de MyChart en el transcurso de 1 a 2 das hbiles.  Para renovar recetas, por favor pida a su farmacia que se ponga en contacto con nuestra oficina. Annie Sable de fax es [place] [phone].  Si tiene un asunto urgente cuando la clnica est cerrada y que no puede esperar hasta el siguiente da hbil, puede llamar/localizar a su doctor(a) al nmero que aparece a continuacin.   Por favor, tenga en cuenta que aunque hacemos todo lo posible para estar disponibles para asuntos urgentes fuera del horario de [place], no estamos disponibles las 24 horas del da, los 7 [address]  [address] de la [place].   Si tiene un problema urgente y no puede comunicarse con nosotros, puede optar por buscar atencin  mdica  en el consultorio de su doctor(a), en una clnica privada, en un centro de atencin urgente o en una sala de emergencias.  Si tiene Engineer, drilling, por favor llame inmediatamente al 911 o vaya a la sala de emergencias.  Nmeros de bper  - Dr. Gwen Pounds: [phone]  - Dra. Roseanne Reno: [phone]  En caso de inclemencias del [place], por favor llame a Lacy Duverney principal al [phone] para una actualizacin sobre el [place] de cualquier retraso o cierre.  Consejos para la medicacin en dermatologa: Por favor, guarde las cajas en las que vienen los medicamentos de uso tpico para ayudarle a seguir las instrucciones sobre dnde y cmo usarlos. Las farmacias generalmente imprimen las instrucciones del medicamento slo en las cajas y no directamente en los tubos del [place].   Si su medicamento es muy caro, por favor, pngase en contacto con Rolm Gala llamando al [phone] y presione la opcin 4  o envenos un mensaje a travs de MyChart.   No podemos decirle cul ser su copago por los medicamentos por adelantado ya que esto es diferente dependiendo de la cobertura de su seguro. Sin embargo, es posible que podamos encontrar un medicamento sustituto a Audiological scientist un formulario para que el seguro cubra el medicamento que se considera necesario.   Si se requiere una autorizacin previa para que su compaa de seguros [place] su medicamento, por favor permtanos de 1 a 2 das hbiles para completar [address]  Los precios de los medicamentos varan con frecuencia dependiendo del Environmental consultant de dnde se surte la receta y alguna farmacias pueden ofrecer precios ms baratos.  El sitio web www.goodrx.com tiene cupones para medicamentos de Health and safety inspector. Los precios aqu no tienen en cuenta lo que podra costar con la ayuda del seguro (puede ser ms barato con su seguro), pero el sitio web puede darle el precio si no utiliz Tourist information centre manager.  - Puede imprimir el  cupn correspondiente y llevarlo con su receta a la farmacia.  - Tambin puede pasar por nuestra oficina durante el horario de atencin regular y Education officer, museum una tarjeta de cupones de GoodRx.  - Si necesita que su receta se enve electrnicamente a una farmacia diferente, informe a nuestra oficina a travs de MyChart de [place] o por telfono llamando al [phone] y presione la opcin 4.

## 2023-06-08 NOTE — Progress Notes (Signed)
Follow-Up Visit   Subjective  Sandra Mcconnell is a 82 y.o. female who presents for the following: Skin Cancer Screening and Full Body Skin Exam  The patient presents for Total-Body Skin Exam (TBSE) for skin cancer screening and mole check. The patient has spots, moles and lesions to be evaluated, some may be new or changing. Patient has a neck brace, and has noticed a spot possibly from rubbing/irritation. No history of skin cancer.     The following portions of the chart were reviewed this encounter and updated as appropriate: medications, allergies, medical history  Review of Systems:  No other skin or systemic complaints except as noted in HPI or Assessment and Plan.  Objective  Well appearing patient in no apparent distress; mood and affect are within normal limits.  A full examination was performed including scalp, head, eyes, ears, nose, lips, neck, chest, axillae, abdomen, back, buttocks, bilateral upper extremities, bilateral lower extremities, hands, feet, fingers, toes, fingernails, and toenails. All findings within normal limits unless otherwise noted below.   Relevant physical exam findings are noted in the Assessment and Plan.  Left Post Lat Thigh 1 mm medium dark brown macule     face Pink papules on the chin; mild erythema malar cheeks.  inferior chin Erythematous stuck-on, waxy papule    Assessment & Plan   SKIN CANCER SCREENING PERFORMED TODAY.  ACTINIC DAMAGE - Chronic condition, secondary to cumulative UV/sun exposure - diffuse scaly erythematous macules with underlying dyspigmentation - Recommend daily broad spectrum sunscreen SPF 30+ to sun-exposed areas, reapply every 2 hours as needed.  - Staying in the shade or wearing long sleeves, sun glasses (UVA+UVB protection) and wide brim hats (4-inch brim around the entire circumference of the hat) are also recommended for sun protection.  - Call for new or changing lesions.  LENTIGINES, SEBORRHEIC KERATOSES,  HEMANGIOMAS - Benign normal skin lesions - Benign-appearing - Call for any changes  MELANOCYTIC NEVI - Tan-brown and/or pink-flesh-colored symmetric macules and papules, including right upper lip - Benign appearing on exam today - Observation - Call clinic for new or changing moles - Recommend daily use of broad spectrum spf 30+ sunscreen to sun-exposed areas.   Milia - firm white papule, left cheek - type of cyst - benign - sometimes these will clear with nightly OTC adapalene/Differin 0.1% gel or retinol. - may be extracted if symptomatic - observe  Acrochordons (Skin Tags) - Fleshy, skin-colored pedunculated papules - Benign appearing.  - Observe. - If desired, they can be removed with an in office procedure that is not covered by insurance. - Please call the clinic if you notice any new or changing lesions.  Nevus Left Post Lat Thigh  Benign-appearing, stable.  Observation.  Call clinic for new or changing moles.  Recommend daily use of broad spectrum spf 30+ sunscreen to sun-exposed areas.   Rosacea face  Chronic and persistent condition with duration or expected duration over one year. Condition is symptomatic/ bothersome to patient. Not currently at goal.   Rosacea is a chronic progressive skin condition usually affecting the face of adults, causing redness and/or acne bumps. It is treatable but not curable. It sometimes affects the eyes (ocular rosacea) as well. It may respond to topical and/or systemic medication and can flare with stress, sun exposure, alcohol, exercise and some foods.  Daily application of broad spectrum spf 30+ sunscreen to face is recommended to reduce flares.   Continue metronidazole 0.75% cream Apply to face qd/bid. Pt has and  will call for refills.   Inflamed seborrheic keratosis inferior chin  Symptomatic, irritated by neck brace. Discussed cryotherapy, but patient defers today. May use bandage over area to prevent irritation from rubbing  on brace.    Return in about 1 year (around 06/07/2024) for TBSE.  ICherlyn Labella, CMA, am acting as scribe for Willeen Niece, MD .   Documentation: I have reviewed the above documentation for accuracy and completeness, and I agree with the above.  Willeen Niece, MD

## 2023-06-22 DIAGNOSIS — Z0289 Encounter for other administrative examinations: Secondary | ICD-10-CM | POA: Insufficient documentation

## 2023-06-27 ENCOUNTER — Encounter: Payer: Medicare PPO | Admitting: Dermatology

## 2023-08-03 ENCOUNTER — Encounter: Payer: Self-pay | Admitting: Student in an Organized Health Care Education/Training Program

## 2023-08-03 ENCOUNTER — Ambulatory Visit
Payer: Medicare PPO | Attending: Student in an Organized Health Care Education/Training Program | Admitting: Student in an Organized Health Care Education/Training Program

## 2023-08-03 VITALS — BP 111/64 | HR 80 | Temp 99.1°F | Ht <= 58 in | Wt 124.0 lb

## 2023-08-03 DIAGNOSIS — Z981 Arthrodesis status: Secondary | ICD-10-CM | POA: Insufficient documentation

## 2023-08-03 DIAGNOSIS — Z79891 Long term (current) use of opiate analgesic: Secondary | ICD-10-CM | POA: Diagnosis present

## 2023-08-03 DIAGNOSIS — M47812 Spondylosis without myelopathy or radiculopathy, cervical region: Secondary | ICD-10-CM | POA: Diagnosis present

## 2023-08-03 DIAGNOSIS — M961 Postlaminectomy syndrome, not elsewhere classified: Secondary | ICD-10-CM | POA: Insufficient documentation

## 2023-08-03 DIAGNOSIS — G894 Chronic pain syndrome: Secondary | ICD-10-CM | POA: Insufficient documentation

## 2023-08-03 DIAGNOSIS — M1712 Unilateral primary osteoarthritis, left knee: Secondary | ICD-10-CM | POA: Diagnosis present

## 2023-08-03 DIAGNOSIS — M5416 Radiculopathy, lumbar region: Secondary | ICD-10-CM | POA: Diagnosis present

## 2023-08-03 NOTE — Progress Notes (Signed)
Safety precautions to be maintained throughout the outpatient stay will include: orient to surroundings, keep bed in low position, maintain call bell within reach at all times, provide assistance with transfer out of bed and ambulation.  

## 2023-08-03 NOTE — Progress Notes (Signed)
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 cyanosis. No masses, redness, swelling, asymmetry, or associated skin lesions. No contractures.  Skin & Extremity Inspection: Skin color, temperature, and hair growth are WNL. No peripheral edema or cyanosis. No masses, redness, swelling, asymmetry, or associated skin lesions. No contractures.  Functional ROM: Unrestricted ROM          Functional ROM: Unrestricted ROM          Muscle Tone/Strength: Functionally intact. No obvious neuro-muscular anomalies detected.  Muscle Tone/Strength: Functionally intact. No obvious neuro-muscular anomalies detected.  Sensory (Neurological): Unimpaired          Sensory (Neurological): Unimpaired          Palpation: No palpable anomalies              Palpation: No palpable anomalies              Provocative Test(s):  Phalen's test: deferred Tinel's test: deferred Apley's scratch test (touch opposite shoulder):  Action 1 (Across chest): deferred Action 2 (Overhead): deferred Action 3 (LB reach): deferred   Provocative Test(s):  Phalen's test: deferred Tinel's test: deferred Apley's scratch test (touch opposite shoulder):  Action 1 (Across chest): deferred Action 2 (Overhead): deferred Action 3 (LB reach): deferred    Thoracic Spine Area Exam  Skin & Axial Inspection: prominent thoracic Kyphosis Alignment: Symmetrical Functional ROM: Unrestricted ROM Stability: No instability detected Muscle Tone/Strength: Functionally intact. No obvious neuro-muscular anomalies detected. Sensory (Neurological): Unimpaired Muscle strength & Tone: No palpable anomalies Lumbar Spine Area Exam  Skin & Axial Inspection: Well healed scar from previous spine surgery detected Alignment: Symmetrical Functional ROM: Unrestricted ROM       Stability: No instability detected Muscle Tone/Strength: Functionally intact. No obvious neuro-muscular anomalies detected. Sensory (Neurological): Dermatomal pain pattern Gait & Posture Assessment  Ambulation: Sandra came in today in a wheel chair Gait: Significantly limited. Dependent on assistive device to ambulate Posture: Difficulty standing up straight, due to pain  Lower Extremity Exam    Side: Right lower extremity  Side: Left lower extremity  Stability: No instability observed          Stability: No instability observed          Skin & Extremity Inspection: Skin color, temperature, and hair growth are WNL. No peripheral edema or cyanosis. No masses, redness, swelling, asymmetry, or associated skin lesions. No contractures.  Skin & Extremity Inspection: Skin color, temperature, and hair growth are WNL. No peripheral edema or cyanosis. No masses, redness, swelling, asymmetry, or associated skin lesions. No contractures.  Functional ROM: Unrestricted ROM                  Functional ROM: Unrestricted ROM                  Muscle Tone/Strength: Functionally intact. No  obvious neuro-muscular anomalies detected.  Muscle Tone/Strength: Functionally intact. No obvious neuro-muscular anomalies detected.  Sensory (Neurological): Neurogenic pain pattern        Sensory (Neurological): Neurogenic pain pattern        DTR: Patellar: deferred today Achilles: deferred today Plantar: deferred today  DTR: Patellar: deferred today Achilles: deferred today Plantar: deferred today  Palpation: No palpable anomalies  Palpation: No palpable anomalies    Assessment  Primary Diagnosis & Pertinent Problem List: The primary encounter diagnosis was Chronic pain syndrome. Diagnoses of Arthritis of left knee, Lumbar radiculopathy, Cervical spondylosis, Encounter for long-term opiate analgesic use, S/P cervical spinal fusion (C2-C6), and Lumbar post-laminectomy syndrome were also pertinent to this visit.  Visit Diagnosis (New problems to examiner): 1. Chronic pain syndrome   2. Arthritis of left knee   3. Lumbar radiculopathy   4. Cervical spondylosis   5. Encounter for long-term opiate analgesic use   6. S/P cervical spinal fusion (C2-C6)   7. Lumbar post-laminectomy syndrome    Plan of Care (Initial workup plan)  General Recommendations: The pain condition that the Sandra suffers from is best treated with a multidisciplinary approach that involves an increase in physical activity to prevent de-conditioning and worsening of the pain cycle, as well as psychological counseling (formal and/or informal) to address the co-morbid psychological affects of pain. Treatment will often involve judicious use of pain medications and interventional procedures to decrease the pain, allowing the Sandra to participate in the physical activity that will ultimately produce long-lasting pain reductions. The goal of the multidisciplinary approach is to return the Sandra to a higher level of overall function and to restore  their ability to perform activities of daily living.   Bilateral occipital  neuralgia: Consider repeating occipital nerve block, last done 04/25/2023.  Considerations could include pulsed radiofrequency ablation for Sprint peripheral nerve stimulation of the occipital nerve.  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  Status post genicular nerve block which was helpful for her left knee pain.  Future considerations include genicular nerve radiofrequency ablation.  History of lumbar laminectomy with lumbar radicular pain: Continue to monitor consider caudal ESI  Cervical spondylosis in the context of cervical fusion.  Continue with tizanidine and gabapentin as prescribed.  Cervical ESI as needed.  Keep follow-up with pain psychology  Urine toxicology screen today which is customary for new patients.   Lab Orders         Compliance Drug Analysis, Ur       Pharmacotherapy (current): Medications ordered:  No orders of the defined types were placed in this encounter.  Medications administered during this visit: Sandra Mcconnell had no medications administered during this visit.   Analgesic Pharmacotherapy:  Opioid Analgesics: For patients currently taking or requesting to take opioid analgesics, in accordance with [facility] Guidelines, we will assess their risks and indications for the use of these substances. After completing our evaluation, we may offer recommendations, but we no longer take patients for medication management. The prescribing physician will ultimately decide, based on his/her training and level of comfort whether to adopt any of the recommendations, including whether or not to prescribe such medicines.  Membrane stabilizer: Adequate regimen  Muscle relaxant: Adequate regimen  NSAID: Tried and failed  Other analgesic(s): To be determined at a later time   Interventional management options: Sandra Mcconnell was informed that there is no guarantee that she would be a candidate for interventional therapies. The decision will be based  on the results of diagnostic studies, as well as Sandra Mcconnell's risk profile.  Procedure(s) under consideration:  Occipital nerve block, pulsed RFA Genicular nerve block, RFA Caudal ESI Cervical ESI Spinal cord stimulation (history of postlaminectomy pain syndrome)     Provider-requested follow-up: Return in about 6 weeks (around 09/12/2023) for MM, F2F.  Future Appointments  Date Time Provider Department Center  09/12/2023 11:20 AM Edward Jolly, MD [facility] None  07/02/2024 11:15 AM Willeen Niece, MD [facility] None    Duration of encounter: .  Total time on encounter, as per AMA guidelines included both the face-to-face and non-face-to-face time personally spent by the physician and/or other qualified health care professional(s) on the day of the encounter (includes time in activities that require the physician or other qualified health care professional and does not include time in activities normally performed by clinical staff). Physician's time may include the following activities when performed: Preparing to see the Sandra (e.g., pre-charting review of records, searching for previously ordered imaging, lab work, and nerve conduction tests) Review of prior analgesic pharmacotherapies. Reviewing PMP Interpreting ordered tests (e.g., lab work, imaging, nerve conduction tests) Performing post-procedure evaluations, including interpretation of diagnostic procedures Obtaining and/or reviewing separately obtained history Performing a medically appropriate examination and/or evaluation Counseling and educating the Sandra/family/caregiver Ordering medications, tests, or procedures Referring and communicating with other health care professionals (when not separately reported) Documenting clinical information in the electronic or other health record Independently interpreting results (not separately reported) and communicating results to the Sandra/ family/caregiver Care coordination  (not separately reported)  Note by: Edward Jolly, MD (TTS technology used. I apologize for any typographical errors that were not detected  and corrected.) Date: 08/03/2023; Time: 11:54 AM

## 2023-08-09 LAB — COMPLIANCE DRUG ANALYSIS, UR

## 2023-09-12 ENCOUNTER — Ambulatory Visit
Payer: Medicare PPO | Attending: Student in an Organized Health Care Education/Training Program | Admitting: Student in an Organized Health Care Education/Training Program

## 2023-09-12 ENCOUNTER — Encounter: Payer: Self-pay | Admitting: Student in an Organized Health Care Education/Training Program

## 2023-09-12 VITALS — BP 125/65 | HR 72 | Temp 98.2°F | Resp 16 | Ht <= 58 in | Wt 124.0 lb

## 2023-09-12 DIAGNOSIS — M5416 Radiculopathy, lumbar region: Secondary | ICD-10-CM | POA: Insufficient documentation

## 2023-09-12 DIAGNOSIS — M533 Sacrococcygeal disorders, not elsewhere classified: Secondary | ICD-10-CM | POA: Diagnosis present

## 2023-09-12 DIAGNOSIS — G8929 Other chronic pain: Secondary | ICD-10-CM | POA: Insufficient documentation

## 2023-09-12 DIAGNOSIS — Z981 Arthrodesis status: Secondary | ICD-10-CM | POA: Insufficient documentation

## 2023-09-12 DIAGNOSIS — M47812 Spondylosis without myelopathy or radiculopathy, cervical region: Secondary | ICD-10-CM | POA: Insufficient documentation

## 2023-09-12 DIAGNOSIS — M1712 Unilateral primary osteoarthritis, left knee: Secondary | ICD-10-CM | POA: Diagnosis present

## 2023-09-12 DIAGNOSIS — M961 Postlaminectomy syndrome, not elsewhere classified: Secondary | ICD-10-CM | POA: Insufficient documentation

## 2023-09-12 DIAGNOSIS — Z79891 Long term (current) use of opiate analgesic: Secondary | ICD-10-CM | POA: Insufficient documentation

## 2023-09-12 DIAGNOSIS — G894 Chronic pain syndrome: Secondary | ICD-10-CM | POA: Insufficient documentation

## 2023-09-12 MED ORDER — BUPRENORPHINE 10 MCG/HR TD PTWK: 1.0000 | MEDICATED_PATCH | TRANSDERMAL | 2 refills | Status: DC

## 2023-09-12 MED ORDER — OXYCODONE HCL 5 MG PO TABS: 5.0000 mg | ORAL_TABLET | Freq: Every day | ORAL | 0 refills | Status: DC | PRN

## 2023-09-12 MED ORDER — GABAPENTIN 800 MG PO TABS: 800.0000 mg | ORAL_TABLET | Freq: Three times a day (TID) | ORAL | 5 refills | Status: DC

## 2023-09-12 MED ORDER — TIZANIDINE HCL 2 MG PO TABS: 1.0000 mg | ORAL_TABLET | Freq: Four times a day (QID) | ORAL | 5 refills | Status: DC

## 2023-09-12 NOTE — Patient Instructions (Signed)

## 2023-09-12 NOTE — Progress Notes (Signed)
Nursing Pain Medication Assessment:  Safety precautions to be maintained throughout the outpatient stay will include: orient to surroundings, keep bed in low position, maintain call bell within reach at all times, provide assistance with transfer out of bed and ambulation.  Medication Inspection Compliance: Pill count conducted under aseptic conditions, in front of the patient. Neither the pills nor the bottle was removed from the patient's sight at any time. Once count was completed pills were immediately returned to the patient in their original bottle.  Medication: Oxycodone IR Pill/Patch Count: 21/30 pills Pill/Patch Appearance: Markings consistent with prescribed medication Bottle Appearance: Standard pharmacy container. Clearly labeled. Filled Date: 33 / 28 / 2024 Last Medication :  Today  Butrans patch 0/4 patches remain

## 2023-09-12 NOTE — Progress Notes (Signed)
PROVIDER NOTE: Information contained herein reflects review and annotations entered in association with encounter. Interpretation of such information and data should be left to medically-trained personnel. Information provided to patient can be located elsewhere in the medical record under "Patient Instructions". Document created using STT-dictation technology, any transcriptional errors that may result from process are unintentional.    Patient: Sandra Mcconnell  Service Category: E/M  Provider: Edward Jolly, MD  DOB: 1941-03-04  DOS: 09/12/2023  Referring Provider: Dorothey Baseman, MD  MRN: [id number]  Specialty: Interventional Pain Management  PCP: Dorothey Baseman, MD  Type: Established Patient  Setting: Ambulatory outpatient    Location: Office  Delivery: Face-to-face     HPI  [name], a 82 y.o. year old female, is here today because of her Chronic pain syndrome [G89.4]. Ms. Coonrod's primary complain today is Headache, Neck Pain (bilateral), and Hip Pain (Left  radiates around to groin since she broke hip  2021)  Pertinent problems: Ms. Donlan has Cervical spinal cord compression (HCC); Anxiety; Depression; Restless leg syndrome; Chronic pain syndrome; Lumbar radiculopathy; Cervical spondylosis; Encounter for long-term opiate analgesic use; S/P cervical spinal fusion (C2-C6); and Lumbar post-laminectomy syndrome on their pertinent problem list. Pain Assessment: Severity of Chronic pain is reported as a 7 /10. Location: Neck Right, Left/radiates up back of head/behind eyes, shoulders bilatera and down arms to hands. Onset: More than a month ago. Quality: Aching, Discomfort, Shooting, Burning, Sore, Constant, Nagging. Timing: Constant. Modifying factor(s): rest, neck collar, medication, procedure. Vitals:  height is 4' 9.34" (1.456 m) and weight is 124 lb (56.2 kg). Her temperature is 98.2 F (36.8 C). Her blood pressure is 125/65 and her pulse is 72. Her respiration is 16 and oxygen saturation is  99%.  BMI: Estimated body mass index is 26.52 kg/m as calculated from the following:   Height as of this encounter: 4' 9.34" (1.456 m).   Weight as of this encounter: 124 lb (56.2 kg). Last encounter: 08/03/2023. Last procedure: Visit date not found.  Reason for encounter: medication management.  Patient presents today for medication management.  No significant change in her medical history since her last clinic visit with me.  She did have a visit with her psychiatrist and psychologist. She is endorsing increased left hip pain, left buttock pain and pain that also radiates into her left groin.  She has a history of left hip fracture.  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  I recommend that she work with physical therapy for her left SI joint and left hip and left groin pain.  HPI from initial clinic visit 08/03/2023 Patient is a pleasant 82 year old female who presents with chronic pain syndrome.  She was an established patient with [facility] pain management but given transportation difficulty and distance to driving to [facility], she would like to have her pain management care transferred here.  I was able to discuss her case with Dr. Comer Locket who is her current pain management physician.  Patient has been with [facility] pain management for over 10 years.  She deals with chronic lumbar radicular pain status post lumbar laminectomy.  She also has cervical spondylosis and has a history of cervical fusion.  She has left knee pain related to left knee osteoarthritis for which she has received genicular nerve blocks for and those were helpful.  She also has bilateral occipital neuralgia for which she has received occipital nerve block in June and states that it was helpful.  She also has significant psychiatric disease  including OCD, PTSD and chronic depression.  She does benefit with oral Valium prior to procedures.  In regards to chronic pain management, she is on Butrans 10 mcg an hour along with oxycodone 5 mg nightly.   She also sees pain psychology at [facility].   Pharmacotherapy Assessment  Analgesic: Butrans 10 mcg an hour along with oxycodone 5 mg nightly   Monitoring: [place] PMP: PDMP reviewed during this encounter.       Pharmacotherapy: No side-effects or adverse reactions reported. Compliance: No problems identified. Effectiveness: Clinically acceptable.  Newman Pies, RN  09/12/2023 11:23 AM  Sign when Signing Visit Nursing Pain Medication Assessment:  Safety precautions to be maintained throughout the outpatient stay will include: orient to surroundings, keep bed in low position, maintain call bell within reach at all times, provide assistance with transfer out of bed and ambulation.  Medication Inspection Compliance: Pill count conducted under aseptic conditions, in front of the patient. Neither the pills nor the bottle was removed from the patient's sight at any time. Once count was completed pills were immediately returned to the patient in their original bottle.  Medication: Oxycodone IR Pill/Patch Count: 21/30 pills Pill/Patch Appearance: Markings consistent with prescribed medication Bottle Appearance: Standard pharmacy container. Clearly labeled. Filled Date: 42 / 28 / 2024 Last Medication :  Today  Butrans patch 0/4 patches remain    No results found for: "CBDTHCR" No results found for: "D8THCCBX" No results found for: "D9THCCBX"  UDS:  Summary  Date Value Ref Range Status  08/03/2023 Note  Final    Comment:    ==================================================================== Compliance Drug Analysis, Ur ==================================================================== Test                             Result       Flag       Units  Drug Present and Declared for Prescription Verification   Oxymorphone                    48           EXPECTED   ng/mg creat   Noroxycodone                   718          EXPECTED   ng/mg creat   Noroxymorphone                 326           EXPECTED   ng/mg creat    Oxymorphone, noroxycodone and noroxymorphone are expected    metabolites of oxycodone. Noroxymorphone is an expected metabolite    of oxymorphone. Sources of oxycodone and/or oxymorphone include    scheduled prescription medications.    Buprenorphine                  7            EXPECTED   ng/mg creat   Norbuprenorphine               6            EXPECTED   ng/mg creat    Source of buprenorphine is a scheduled prescription medication.    Norbuprenorphine is an expected metabolite of buprenorphine.    Gabapentin                     PRESENT  EXPECTED   Sertraline                     PRESENT      EXPECTED   Desmethylsertraline            PRESENT      EXPECTED    Desmethylsertraline is an expected metabolite of sertraline.    Acetaminophen                  PRESENT      EXPECTED  Drug Absent but Declared for Prescription Verification   Oxycodone                      Not Detected UNEXPECTED ng/mg creat    Oxycodone is almost always present in patients taking this drug    consistently.  Absence of oxycodone could be due to lapse of time    since the last dose or unusual pharmacokinetics (rapid metabolism).    Tizanidine                     Not Detected UNEXPECTED    Tizanidine, as indicated in the declared medication list, is not    always detected even when used as directed.    Quetiapine                     Not Detected UNEXPECTED   Diclofenac                     Not Detected UNEXPECTED    Diclofenac, as indicated in the declared medication list, is not    always detected even when used as directed.    Salicylate                     Not Detected UNEXPECTED    Aspirin, as indicated in the declared medication list, is not always    detected even when used as directed.    Lidocaine                      Not Detected UNEXPECTED    Lidocaine, as indicated in the declared medication list, is not    always detected even when used as  directed.  ==================================================================== Test                      Result    Flag   Units      Ref Range   Creatinine              169              mg/dL      >=84 ==================================================================== Declared Medications:  The flagging and interpretation on this report are based on the  following declared medications.  Unexpected results may arise from  inaccuracies in the declared medications.   **Note: The testing scope of this panel includes these medications:   Gabapentin (Neurontin)  Oxycodone  Quetiapine (Seroquel)  Sertraline (Zoloft)   **Note: The testing scope of this panel does not include small to  moderate amounts of these reported medications:   Acetaminophen (Tylenol)  Aspirin  Buprenorphine Patch (BuTrans)  Diclofenac  Lidocaine (Xylocaine)  Tizanidine   **Note: The testing scope of this panel does not include the  following reported medications:   Brimonidine (Alphagan)  Calcium  Dorzolamide (Trusopt)  Eye Drops  Fluticasone  Glucosamine  Ketorolac (Toradol)  Melatonin  Multivitamin  Omeprazole (Prilosec)  Ondansetron (Zofran)  Prazosin (Minipress)  Probiotic  Raloxifene (Evista)  Ropinirole (Requip)  Simethicone  Topical ==================================================================== For clinical consultation, please call [phone]. ====================================================================       ROS  Constitutional: Denies any fever or chills Gastrointestinal: No reported hemesis, hematochezia, vomiting, or acute GI distress Musculoskeletal:  cervical spine and occipital nerve pain left hip, SI joint left groin pain Neurological: No reported episodes of acute onset apraxia, aphasia, dysarthria, agnosia, amnesia, paralysis, loss of coordination, or loss of consciousness  Medication Review  Calcium 600+D3 Plus Minerals, Ginger Root, Glucosamine  Sulfate, Multiple Vitamins, QUEtiapine, Simethicone, acetaminophen, acidophilus, aspirin EC, brimonidine, buprenorphine, cyclopentolate, dorzolamide, fluticasone, gabapentin, ketorolac, melatonin, omeprazole, ondansetron, oxyCODONE, prazosin, rOPINIRole, raloxifene, sertraline, and tiZANidine  History Review  Allergy: Ms. Tilmon is allergic to aripiprazole, ciprofloxacin, mirtazapine, and penicillins. Drug: Ms. Karow  reports no history of drug use. Alcohol:  reports no history of alcohol use. Tobacco:  reports that she has never smoked. She has never used smokeless tobacco. Social: Ms. Krist  reports that she has never smoked. She has never used smokeless tobacco. She reports that she does not drink alcohol and does not use drugs. Medical:  has a past medical history of Anemia (08/04/2017), Anxiety, Carotid artery disease (HCC), Carpal tunnel syndrome, Cataract cortical, senile, Cervical spinal cord compression (HCC), Chicken pox, Colitis, Complication of anesthesia, Depression, Dizziness, DJD (degenerative joint disease), Dysphagia, GERD (gastroesophageal reflux disease), Hallux rigidus, Hemorrhoids, History of hiatal hernia, History of palpitations, HLD (hyperlipidemia), Hypertension, IBS (irritable bowel syndrome), Iritis, Lactose intolerance, Migraines, Neuropathy, OCD (obsessive compulsive disorder), OSA on CPAP, Osteoarthritis, Osteoporosis, Panic attacks, PONV (postoperative nausea and vomiting), Psoriasis, PTSD (post-traumatic stress disorder), Redundant colon, REM sleep behavior disorder, Rosacea, Slow transit constipation, Spondylosis, and Stroke (HCC) (08/2000). Surgical: Ms. Eckmann  has a past surgical history that includes Hand surgery; Spine surgery; Tonsillectomy; Abdominal hysterectomy; Back surgery; Foot Fusion (Bilateral); Eye surgery; TORUS PALATINUS (2007); Cataract extraction w/PHACO (Right, 12/08/2015); Colonoscopy; Esophagogastroduodenoscopy; Esophagogastroduodenoscopy (N/A, 11/15/2017);  Colonoscopy with propofol (N/A, 11/15/2017); Intramedullary (im) nail intertrochanteric (Left, 11/04/2020); and Hardware Removal (Left, 04/29/2021). Family: family history includes Breast cancer in her mother.  Laboratory Chemistry Profile   Renal Lab Results  Component Value Date   BUN 17 11/12/2020   CREATININE 0.59 11/12/2020   GFRAA >60 09/06/2015   GFRNONAA >60 11/12/2020    Hepatic Lab Results  Component Value Date   AST 23 09/06/2015   ALT 14 09/06/2015   ALBUMIN 4.0 09/06/2015   ALKPHOS 56 09/06/2015   LIPASE 23 09/06/2015    Electrolytes Lab Results  Component Value Date   NA 139 11/12/2020   K 3.0 (L) 11/12/2020   CL 98 11/12/2020   CALCIUM 7.7 (L) 11/12/2020   MG 1.9 11/12/2020    Bone No results found for: "VD25OH", "VD125OH2TOT", "UJ8119JY7", "WG9562ZH0", "25OHVITD1", "25OHVITD2", "25OHVITD3", "TESTOFREE", "TESTOSTERONE"  Inflammation (CRP: Acute Phase) (ESR: Chronic Phase) No results found for: "CRP", "ESRSEDRATE", "LATICACIDVEN"       Note: Above Lab results reviewed.  Recent Imaging Review  MM 3D SCREENING MAMMOGRAM BILATERAL BREAST CLINICAL DATA:  Screening.  EXAM: DIGITAL SCREENING BILATERAL MAMMOGRAM WITH TOMOSYNTHESIS AND CAD  TECHNIQUE: Bilateral screening digital craniocaudal and mediolateral oblique mammograms were obtained. Bilateral screening digital breast tomosynthesis was performed. The images were evaluated with computer-aided detection.  COMPARISON:  Previous exam(s).  ACR Breast Density Category b: There are scattered areas of fibroglandular density.  FINDINGS: There are no findings suspicious for malignancy.  IMPRESSION: No  mammographic evidence of malignancy. A result letter of this screening mammogram will be mailed directly to the patient.  RECOMMENDATION: Screening mammogram in one year. (Code:SM-B-01Y)  BI-RADS CATEGORY  1: Negative.  Electronically Signed   By: Edwin Cap M.D.   On: 03/23/2023 16:07 Note:  Reviewed        Physical Exam  General appearance: Well nourished, well developed, and well hydrated. In no apparent acute distress Mental status: Alert, oriented x 3 (person, place, & time)       Respiratory: No evidence of acute respiratory distress Eyes: PERLA Vitals: BP 125/65   Pulse 72   Temp 98.2 F (36.8 C)   Resp 16   Ht 4' 9.34" (1.456 m)   Wt 124 lb (56.2 kg)   SpO2 99%   BMI 26.52 kg/m  BMI: Estimated body mass index is 26.52 kg/m as calculated from the following:   Height as of this encounter: 4' 9.34" (1.456 m).   Weight as of this encounter: 124 lb (56.2 kg). Ideal: Patient must be at least 60 in tall to calculate ideal body weight  Cervical Spine Area Exam  Skin & Axial Inspection: Well healed scar from previous spine surgery detected Alignment: Symmetrical Functional ROM: Pain restricted ROM, bilaterally Stability: No instability detected Muscle Tone/Strength: Functionally intact. No obvious neuro-muscular anomalies detected. Sensory (Neurological): Neurogenic pain pattern Palpation: No palpable anomalies             Upper Extremity (UE) Exam      Side: Right upper extremity   Side: Left upper extremity  Skin & Extremity Inspection: Skin color, temperature, and hair growth are WNL. No peripheral edema or cyanosis. No masses, redness, swelling, asymmetry, or associated skin lesions. No contractures.   Skin & Extremity Inspection: Skin color, temperature, and hair growth are WNL. No peripheral edema or cyanosis. No masses, redness, swelling, asymmetry, or associated skin lesions. No contractures.  Functional ROM: Unrestricted ROM           Functional ROM: Unrestricted ROM          Muscle Tone/Strength: Functionally intact. No obvious neuro-muscular anomalies detected.   Muscle Tone/Strength: Functionally intact. No obvious neuro-muscular anomalies detected.  Sensory (Neurological): Unimpaired           Sensory (Neurological): Unimpaired          Palpation: No  palpable anomalies               Palpation: No palpable anomalies              Provocative Test(s):  Phalen's test: deferred Tinel's test: deferred Apley's scratch test (touch opposite shoulder):  Action 1 (Across chest): deferred Action 2 (Overhead): deferred Action 3 (LB reach): deferred     Provocative Test(s):  Phalen's test: deferred Tinel's test: deferred Apley's scratch test (touch opposite shoulder):  Action 1 (Across chest): deferred Action 2 (Overhead): deferred Action 3 (LB reach): deferred      Thoracic Spine Area Exam  Skin & Axial Inspection: prominent thoracic Kyphosis Alignment: Symmetrical Functional ROM: Unrestricted ROM Stability: No instability detected Muscle Tone/Strength: Functionally intact. No obvious neuro-muscular anomalies detected. Sensory (Neurological): Unimpaired Muscle strength & Tone: No palpable anomalies Lumbar Spine Area Exam  Skin & Axial Inspection: Well healed scar from previous spine surgery detected Alignment: Symmetrical Functional ROM: Unrestricted ROM       Stability: No instability detected Muscle Tone/Strength: Functionally intact. No obvious neuro-muscular anomalies detected. Sensory (Neurological): Dermatomal pain pattern  Left hip, SI joint  and groin pain as well Positive Patrick's on the left Gait & Posture Assessment  Ambulation: Patient came in today in a wheel chair Gait: Significantly limited. Dependent on assistive device to ambulate Posture: Difficulty standing up straight, due to pain  Lower Extremity Exam      Side: Right lower extremity   Side: Left lower extremity  Stability: No instability observed           Stability: No instability observed          Skin & Extremity Inspection: Skin color, temperature, and hair growth are WNL. No peripheral edema or cyanosis. No masses, redness, swelling, asymmetry, or associated skin lesions. No contractures.   Skin & Extremity Inspection: Skin color, temperature, and hair  growth are WNL. No peripheral edema or cyanosis. No masses, redness, swelling, asymmetry, or associated skin lesions. No contractures.  Functional ROM: Unrestricted ROM                   Functional ROM: Unrestricted ROM                  Muscle Tone/Strength: Functionally intact. No obvious neuro-muscular anomalies detected.   Muscle Tone/Strength: Functionally intact. No obvious neuro-muscular anomalies detected.  Sensory (Neurological): Neurogenic pain pattern         Sensory (Neurological): Neurogenic pain pattern        DTR: Patellar: deferred today Achilles: deferred today Plantar: deferred today   DTR: Patellar: deferred today Achilles: deferred today Plantar: deferred today  Palpation: No palpable anomalies   Palpation: No palpable anomalies       Assessment   Diagnosis Status  1. Chronic pain syndrome   2. Arthritis of left knee   3. Lumbar radiculopathy   4. Cervical spondylosis   5. Encounter for long-term opiate analgesic use   6. S/P cervical spinal fusion (C2-C6)   7. Lumbar post-laminectomy syndrome   8. Chronic left SI joint pain    Controlled Controlled Controlled   Updated Problems: No problems updated.  Plan of Care  Bilateral occipital neuralgia: Consider repeating occipital nerve block, last done 04/25/2023.  Considerations could include pulsed radiofrequency ablation for Sprint peripheral nerve stimulation of the occipital nerve.   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  Status post genicular nerve block which was helpful for her left knee pain.  Future considerations include genicular nerve radiofrequency ablation.   History of lumbar laminectomy with lumbar radicular pain: Continue to monitor consider caudal ESI   Cervical spondylosis in the context of cervical fusion.  Continue with tizanidine and gabapentin as prescribed.  Cervical ESI as needed.   Keep follow-up with pain psychology  Medication management as below, no change in  dose  Referral to physical therapy to help with SI joint and hip pain on the left.  Future considerations could include diagnostic left SI joint injection.  Pharmacotherapy (Medications Ordered): Meds ordered this encounter  Medications   buprenorphine (BUTRANS) 10 MCG/HR PTWK    Sig: Place 1 patch onto the skin every Monday.    Dispense:  4 patch    Refill:  2   oxyCODONE (OXY IR/ROXICODONE) 5 MG immediate release tablet    Sig: Take 1 tablet (5 mg total) by mouth daily as needed for moderate pain (pain score 4-6).    Dispense:  30 tablet    Refill:  0   oxyCODONE (OXY IR/ROXICODONE) 5 MG immediate release tablet    Sig: Take 1 tablet (5 mg total) by mouth  daily as needed for moderate pain (pain score 4-6).    Dispense:  30 tablet    Refill:  0   oxyCODONE (OXY IR/ROXICODONE) 5 MG immediate release tablet    Sig: Take 1 tablet (5 mg total) by mouth daily as needed for moderate pain (pain score 4-6).    Dispense:  30 tablet    Refill:  0   gabapentin (NEURONTIN) 800 MG tablet    Sig: Take 1 tablet (800 mg total) by mouth 3 (three) times daily.    Dispense:  90 tablet    Refill:  5   tiZANidine (ZANAFLEX) 2 MG tablet    Sig: Take 0.5 tablets (1 mg total) by mouth 4 (four) times daily.    Dispense:  60 tablet    Refill:  5   Orders:  Orders Placed This Encounter  Procedures   Ambulatory referral to Physical Therapy    Referral Priority:   Routine    Referral Type:   Physical Medicine    Referral Reason:   Specialty Services Required    Requested Specialty:   Physical Therapy    Number of Visits Requested:   1   Follow-up plan:   Return in about 3 months (around 12/14/2023) for MM, F2F.     Recent Visits Date Type Provider Dept  08/03/23 Office Visit Edward Jolly, MD [facility]  Showing recent visits within past 90 days and meeting all other requirements Today's Visits Date Type Provider Dept  09/12/23 Office Visit Edward Jolly, MD [facility]   Showing today's visits and meeting all other requirements Future Appointments No visits were found meeting these conditions. Showing future appointments within next 90 days and meeting all other requirements  I discussed the assessment and treatment plan with the patient. The patient was provided an opportunity to ask questions and all were answered. The patient agreed with the plan and demonstrated an understanding of the instructions.  Patient advised to call back or seek an in-person evaluation if the symptoms or condition worsens.  Duration of encounter: .  Total time on encounter, as per AMA guidelines included both the face-to-face and non-face-to-face time personally spent by the physician and/or other qualified health care professional(s) on the day of the encounter (includes time in activities that require the physician or other qualified health care professional and does not include time in activities normally performed by clinical staff). Physician's time may include the following activities when performed: Preparing to see the patient (e.g., pre-charting review of records, searching for previously ordered imaging, lab work, and nerve conduction tests) Review of prior analgesic pharmacotherapies. Reviewing PMP Interpreting ordered tests (e.g., lab work, imaging, nerve conduction tests) Performing post-procedure evaluations, including interpretation of diagnostic procedures Obtaining and/or reviewing separately obtained history Performing a medically appropriate examination and/or evaluation Counseling and educating the patient/family/caregiver Ordering medications, tests, or procedures Referring and communicating with other health care professionals (when not separately reported) Documenting clinical information in the electronic or other health record Independently interpreting results (not separately reported) and communicating results to the patient/  family/caregiver Care coordination (not separately reported)  Note by: Edward Jolly, MD Date: 09/12/2023; Time: 11:42 AM

## 2023-09-13 MED ORDER — OXYCODONE HCL 5 MG PO TABS: 5.0000 mg | ORAL_TABLET | Freq: Every day | ORAL | 0 refills | Status: DC | PRN

## 2023-09-13 MED ORDER — OXYCODONE HCL 5 MG PO TABS: 5.0000 mg | ORAL_TABLET | Freq: Every day | ORAL | 0 refills | Status: AC | PRN

## 2023-09-13 NOTE — Addendum Note (Signed)
Addended by: Edward Jolly on: 09/13/2023 01:57 PM   Modules accepted: Orders

## 2023-10-19 ENCOUNTER — Encounter: Payer: Self-pay | Admitting: Student in an Organized Health Care Education/Training Program

## 2023-12-02 ENCOUNTER — Other Ambulatory Visit: Payer: Self-pay | Admitting: Student in an Organized Health Care Education/Training Program

## 2023-12-05 ENCOUNTER — Encounter: Payer: Self-pay | Admitting: Student in an Organized Health Care Education/Training Program

## 2023-12-05 ENCOUNTER — Ambulatory Visit
Payer: Medicare PPO | Attending: Student in an Organized Health Care Education/Training Program | Admitting: Student in an Organized Health Care Education/Training Program

## 2023-12-05 VITALS — BP 117/56 | HR 69 | Temp 98.3°F | Resp 16 | Ht <= 58 in | Wt 124.0 lb

## 2023-12-05 DIAGNOSIS — M1712 Unilateral primary osteoarthritis, left knee: Secondary | ICD-10-CM | POA: Diagnosis present

## 2023-12-05 DIAGNOSIS — Z79891 Long term (current) use of opiate analgesic: Secondary | ICD-10-CM | POA: Insufficient documentation

## 2023-12-05 DIAGNOSIS — G894 Chronic pain syndrome: Secondary | ICD-10-CM | POA: Diagnosis present

## 2023-12-05 DIAGNOSIS — M5481 Occipital neuralgia: Secondary | ICD-10-CM | POA: Insufficient documentation

## 2023-12-05 DIAGNOSIS — M47812 Spondylosis without myelopathy or radiculopathy, cervical region: Secondary | ICD-10-CM | POA: Diagnosis not present

## 2023-12-05 DIAGNOSIS — M5416 Radiculopathy, lumbar region: Secondary | ICD-10-CM | POA: Insufficient documentation

## 2023-12-05 MED ORDER — BUPRENORPHINE 10 MCG/HR TD PTWK: 1.0000 | MEDICATED_PATCH | TRANSDERMAL | 2 refills | Status: DC

## 2023-12-05 MED ORDER — OXYCODONE HCL 5 MG PO TABS: 5.0000 mg | ORAL_TABLET | Freq: Three times a day (TID) | ORAL | 0 refills | Status: AC | PRN

## 2023-12-05 NOTE — Patient Instructions (Signed)

## 2023-12-05 NOTE — Progress Notes (Signed)
Nursing Pain Medication Assessment:  Safety precautions to be maintained throughout the outpatient stay will include: orient to surroundings, keep bed in low position, maintain call bell within reach at all times, provide assistance with transfer out of bed and ambulation.  Medication Inspection Compliance: Pill count conducted under aseptic conditions, in front of the patient. Neither the pills nor the bottle was removed from the patient's sight at any time. Once count was completed pills were immediately returned to the patient in their original bottle.  Medication #1: Buprenorphine (Suboxone) Pill/Patch Count:  0 of 4 patches remain Pill/Patch Appearance:  empty Bottle Appearance: Standard pharmacy container. Clearly labeled. Filled Date: 1 / 02 / 2025 Last Medication intake:   monday  Medication #2: Oxycodone IR Pill/Patch Count:  27 of 30 pills remain Pill/Patch Appearance: Markings consistent with prescribed medication Bottle Appearance: Standard pharmacy container. Clearly labeled. Filled Date: 01 / 25 / 2025 Last Medication intake:  Yesterday

## 2023-12-05 NOTE — Progress Notes (Signed)
PROVIDER NOTE: Information contained herein reflects review and annotations entered in association with encounter. Interpretation of such information and data should be left to medically-trained personnel. Information provided to patient can be located elsewhere in the medical record under "Patient Instructions". Document created using STT-dictation technology, any transcriptional errors that may result from process are unintentional.    Patient: Sandra Mcconnell  Service Category: E/M  Provider: Edward Jolly, MD  DOB: [birth year]  DOS: 12/05/2023  Referring Provider: Dorothey Baseman, MD  MRN: [id number]  Specialty: Interventional Pain Management  PCP: Dorothey Baseman, MD  Type: Established Patient  Setting: Ambulatory outpatient    Location: Office  Delivery: Face-to-face     HPI  [name], a 83 y.o. year old female, is here today because of her Chronic pain syndrome [G89.4]. Ms. Moctezuma's primary complain today is Neck Pain (Bilateral worse on the right s/p surgical procedures, fusion C2,-6 x 2.  Hardware present ), Hip Pain (Left s/p fx, hardware present ), and Knee Pain (Left , has been injected Dr Comer Locket and is in currently in PT )  Pertinent problems: Ms. Bremer has Cervical spinal cord compression (HCC); Anxiety; Depression; Restless leg syndrome; Chronic pain syndrome; Lumbar radiculopathy; Cervical spondylosis; Encounter for long-term opiate analgesic use; S/P cervical spinal fusion (C2-C6); and Lumbar post-laminectomy syndrome on their pertinent problem list. Pain Assessment: Severity of Chronic pain is reported as a 7 /10. Location: Neck (left hip and left knee) Left, Right/neck pain causing headaches and radiates anterior,  left hip pain down the left leg.. Onset: More than a month ago. Quality: Discomfort, Constant, Radiating, Sharp, Aching, Burning. Timing: Constant. Modifying factor(s): GONB helped neck/headache pain in the past.. Vitals:  height is 4\' 10"  (1.473 m) and weight is 124 lb  (56.2 kg). Her temporal temperature is 98.3 F (36.8 C). Her blood pressure is 117/56 (abnormal) and her pulse is 69. Her respiration is 16 and oxygen saturation is 98%.  BMI: Estimated body mass index is 25.92 kg/m as calculated from the following:   Height as of this encounter: 4\' 10"  (1.473 m).   Weight as of this encounter: 124 lb (56.2 kg). Last encounter: 09/12/2023. Last procedure: Visit date not found.  Reason for encounter:   History of Present Illness   The patient presents for medication management of ibuprofen and oxycodone.  Her current regimen includes oxycodone, which she obtains monthly, and she is also using a Butrans patch.  She experiences primary pain in her neck, which leads to headaches. She has previously responded well to greater occipital nerve blocks for this issue.  She has severe bilateral head pain, more intense on the right side, extending lower than usual. This episode has been shorter than her typical year-long duration.  She experiences left hip pain radiating into her left leg and left knee pain. A recent left knee injection provided relief. She continues to engage in pain psychology at [facility].  She has been attending physical therapy, which has been beneficial. However, she has identified an issue with her leg positioning, noting that her foot is always turned out, which she feels more at night. This misalignment causes significant groin pain, making it difficult to take steps without pain, and also affects her knee.       Pharmacotherapy Assessment  Analgesic: Butrans 10 mcg/hr, Oxycodone 5 mg daily prn  Monitoring: [place] PMP: PDMP reviewed during this encounter.       Pharmacotherapy: No side-effects or adverse reactions reported. Compliance: No problems identified.  Effectiveness: Clinically acceptable.  Vernie Ammons, RN  12/05/2023 11:20 AM  Sign when Signing Visit Nursing Pain Medication Assessment:  Safety precautions to be maintained  throughout the outpatient stay will include: orient to surroundings, keep bed in low position, maintain call bell within reach at all times, provide assistance with transfer out of bed and ambulation.  Medication Inspection Compliance: Pill count conducted under aseptic conditions, in front of the patient. Neither the pills nor the bottle was removed from the patient's sight at any time. Once count was completed pills were immediately returned to the patient in their original bottle.  Medication #1: Buprenorphine (Suboxone) Pill/Patch Count:  0 of 4 patches remain Pill/Patch Appearance:  empty Bottle Appearance: Standard pharmacy container. Clearly labeled. Filled Date: 1 / 02 / 2025 Last Medication intake:   monday  Medication #2: Oxycodone IR Pill/Patch Count:  27 of 30 pills remain Pill/Patch Appearance: Markings consistent with prescribed medication Bottle Appearance: Standard pharmacy container. Clearly labeled. Filled Date: 01 / 25 / 2025 Last Medication intake:  Yesterday    No results found for: "CBDTHCR" No results found for: "D8THCCBX" No results found for: "D9THCCBX"  UDS:  Summary  Date Value Ref Range Status  08/03/2023 Note  Final    Comment:    ==================================================================== Compliance Drug Analysis, Ur ==================================================================== Test                             Result       Flag       Units  Drug Present and Declared for Prescription Verification   Oxymorphone                    48           EXPECTED   ng/mg creat   Noroxycodone                   718          EXPECTED   ng/mg creat   Noroxymorphone                 326          EXPECTED   ng/mg creat    Oxymorphone, noroxycodone and noroxymorphone are expected    metabolites of oxycodone. Noroxymorphone is an expected metabolite    of oxymorphone. Sources of oxycodone and/or oxymorphone include    scheduled prescription medications.     Buprenorphine                  7            EXPECTED   ng/mg creat   Norbuprenorphine               6            EXPECTED   ng/mg creat    Source of buprenorphine is a scheduled prescription medication.    Norbuprenorphine is an expected metabolite of buprenorphine.    Gabapentin                     PRESENT      EXPECTED   Sertraline                     PRESENT      EXPECTED   Desmethylsertraline            PRESENT      EXPECTED  Desmethylsertraline is an expected metabolite of sertraline.    Acetaminophen                  PRESENT      EXPECTED  Drug Absent but Declared for Prescription Verification   Oxycodone                      Not Detected UNEXPECTED ng/mg creat    Oxycodone is almost always present in patients taking this drug    consistently.  Absence of oxycodone could be due to lapse of time    since the last dose or unusual pharmacokinetics (rapid metabolism).    Tizanidine                     Not Detected UNEXPECTED    Tizanidine, as indicated in the declared medication list, is not    always detected even when used as directed.    Quetiapine                     Not Detected UNEXPECTED   Diclofenac                     Not Detected UNEXPECTED    Diclofenac, as indicated in the declared medication list, is not    always detected even when used as directed.    Salicylate                     Not Detected UNEXPECTED    Aspirin, as indicated in the declared medication list, is not always    detected even when used as directed.    Lidocaine                      Not Detected UNEXPECTED    Lidocaine, as indicated in the declared medication list, is not    always detected even when used as directed.  ==================================================================== Test                      Result    Flag   Units      Ref Range   Creatinine              169              mg/dL      >=53 ==================================================================== Declared  Medications:  The flagging and interpretation on this report are based on the  following declared medications.  Unexpected results may arise from  inaccuracies in the declared medications.   **Note: The testing scope of this panel includes these medications:   Gabapentin (Neurontin)  Oxycodone  Quetiapine (Seroquel)  Sertraline (Zoloft)   **Note: The testing scope of this panel does not include small to  moderate amounts of these reported medications:   Acetaminophen (Tylenol)  Aspirin  Buprenorphine Patch (BuTrans)  Diclofenac  Lidocaine (Xylocaine)  Tizanidine   **Note: The testing scope of this panel does not include the  following reported medications:   Brimonidine (Alphagan)  Calcium  Dorzolamide (Trusopt)  Eye Drops  Fluticasone  Glucosamine  Ketorolac (Toradol)  Melatonin  Multivitamin  Omeprazole (Prilosec)  Ondansetron (Zofran)  Prazosin (Minipress)  Probiotic  Raloxifene (Evista)  Ropinirole (Requip)  Simethicone  Topical ==================================================================== For clinical consultation, please call [phone]. ====================================================================       ROS  Constitutional: Denies any fever or chills Gastrointestinal: No reported hemesis, hematochezia, vomiting, or  acute GI distress Musculoskeletal:  as above Neurological:  occipital neuralgia  Medication Review  Calcium 600+D3 Plus Minerals, Ginger Root, Glucosamine Sulfate, Multiple Vitamins, Simethicone, acetaminophen, aspirin EC, brimonidine, buprenorphine, cyclopentolate, dorzolamide, fluticasone, gabapentin, ketorolac, melatonin, omeprazole, ondansetron, oxyCODONE, prazosin, rOPINIRole, raloxifene, sertraline, and tiZANidine  History Review  Allergy: Ms. Sakurai is allergic to aripiprazole, ciprofloxacin, mirtazapine, and penicillins. Drug: Ms. Mendel  reports no history of drug use. Alcohol:  reports no history of alcohol  use. Tobacco:  reports that she has never smoked. She has never used smokeless tobacco. Social: Ms. Barth  reports that she has never smoked. She has never used smokeless tobacco. She reports that she does not drink alcohol and does not use drugs. Medical:  has a past medical history of Anemia (08/04/2017), Anxiety, Carotid artery disease (HCC), Carpal tunnel syndrome, Cataract cortical, senile, Cervical spinal cord compression (HCC), Chicken pox, Colitis, Complication of anesthesia, Depression, Dizziness, DJD (degenerative joint disease), Dysphagia, GERD (gastroesophageal reflux disease), Hallux rigidus, Hemorrhoids, History of hiatal hernia, History of palpitations, HLD (hyperlipidemia), Hypertension, IBS (irritable bowel syndrome), Iritis, Lactose intolerance, Migraines, Neuropathy, OCD (obsessive compulsive disorder), OSA on CPAP, Osteoarthritis, Osteoporosis, Panic attacks, PONV (postoperative nausea and vomiting), Psoriasis, PTSD (post-traumatic stress disorder), Redundant colon, REM sleep behavior disorder, Rosacea, Slow transit constipation, Spondylosis, and Stroke (HCC) (08/2000). Surgical: Ms. Barb  has a past surgical history that includes Hand surgery; Spine surgery; Tonsillectomy; Abdominal hysterectomy; Back surgery; Foot Fusion (Bilateral); Eye surgery; TORUS PALATINUS (2007); Cataract extraction w/PHACO (Right, 12/08/2015); Colonoscopy; Esophagogastroduodenoscopy; Esophagogastroduodenoscopy (N/A, 11/15/2017); Colonoscopy with propofol (N/A, 11/15/2017); Intramedullary (im) nail intertrochanteric (Left, 11/04/2020); and Hardware Removal (Left, 04/29/2021). Family: family history includes Breast cancer in her mother.  Laboratory Chemistry Profile   Renal Lab Results  Component Value Date   BUN 17 11/12/2020   CREATININE 0.59 11/12/2020   GFRAA >60 09/06/2015   GFRNONAA >60 11/12/2020    Hepatic Lab Results  Component Value Date   AST 23 09/06/2015   ALT 14 09/06/2015   ALBUMIN 4.0  09/06/2015   ALKPHOS 56 09/06/2015   LIPASE 23 09/06/2015    Electrolytes Lab Results  Component Value Date   NA 139 11/12/2020   K 3.0 (L) 11/12/2020   CL 98 11/12/2020   CALCIUM 7.7 (L) 11/12/2020   MG 1.9 11/12/2020    Bone No results found for: "VD25OH", "VD125OH2TOT", "ZO1096EA5", "WU9811BJ4", "25OHVITD1", "25OHVITD2", "25OHVITD3", "TESTOFREE", "TESTOSTERONE"  Inflammation (CRP: Acute Phase) (ESR: Chronic Phase) No results found for: "CRP", "ESRSEDRATE", "LATICACIDVEN"       Note: Above Lab results reviewed.  Recent Imaging Review  MM 3D SCREENING MAMMOGRAM BILATERAL BREAST CLINICAL DATA:  Screening.  EXAM: DIGITAL SCREENING BILATERAL MAMMOGRAM WITH TOMOSYNTHESIS AND CAD  TECHNIQUE: Bilateral screening digital craniocaudal and mediolateral oblique mammograms were obtained. Bilateral screening digital breast tomosynthesis was performed. The images were evaluated with computer-aided detection.  COMPARISON:  Previous exam(s).  ACR Breast Density Category b: There are scattered areas of fibroglandular density.  FINDINGS: There are no findings suspicious for malignancy.  IMPRESSION: No mammographic evidence of malignancy. A result letter of this screening mammogram will be mailed directly to the patient.  RECOMMENDATION: Screening mammogram in one year. (Code:SM-B-01Y)  BI-RADS CATEGORY  1: Negative.  Electronically Signed   By: Edwin Cap M.D.   On: 03/23/2023 16:07 Note: Reviewed        Physical Exam  General appearance: Well nourished, well developed, and well hydrated. In no apparent acute distress Mental status: Alert, oriented x 3 (person, place, & time)  Respiratory: No evidence of acute respiratory distress Eyes: PERLA Vitals: BP (!) 117/56 (BP Location: Left Arm, Patient Position: Sitting, Cuff Size: Small)   Pulse 69   Temp 98.3 F (36.8 C) (Temporal)   Resp 16   Ht 4\' 10"  (1.473 m)   Wt 124 lb (56.2 kg)   SpO2 98%   BMI 25.92  kg/m  BMI: Estimated body mass index is 25.92 kg/m as calculated from the following:   Height as of this encounter: 4\' 10"  (1.473 m).   Weight as of this encounter: 124 lb (56.2 kg). Ideal: Patient must be at least 60 in tall to calculate ideal body weight  +occipital neuralgia  Assessment   Diagnosis Status  1. Chronic pain syndrome   2. Arthritis of left knee   3. Lumbar radiculopathy   4. Cervical spondylosis   5. Encounter for long-term opiate analgesic use   6. Bilateral occipital neuralgia    Controlled Controlled Controlled   Updated Problems: Problem  Bilateral Occipital Neuralgia    Plan of Care  Problem-specific:  Assessment and Plan    Chronic Pain Management   Chronic neck pain is causing headaches, previously responsive to greater occipital nerve blocks. Left hip pain radiates into the left leg, and left knee pain has improved with a recent knee injection. Physical therapy is beneficial, but foot turning out causes groin and knee pain, especially at night. A stabilization device for night use was discussed. Prescribe oxycodone 5 mg daily prn with a quantity of 90 for three months, to reduce pharmacy visits. Send in a Butrans patch prescription. Advise contacting a physical therapist for stabilization device recommendations. Consider an occipital nerve block if she decides to proceed.  Occipital Neuralgia   Severe bilateral headaches are worse on the right, with pain extending unusually low, possibly into the face. Explained the atypical symptom extension. Consider another occipital nerve block. Call if deciding to proceed with the occipital nerve block.  Left Hip and Knee Pain   Left hip pain radiates into the left leg, and left knee pain has improved with a recent knee injection. Physical therapy is beneficial. Discussed a stabilization device for night use. Advise contacting a physical therapist for stabilization device recommendations.  Follow-up   Send in  the [facility] patch prescription today. The oxycodone prescription is to be picked up on January 24th at [facility].       Ms. ALEIAH MOHAMMED has a current medication list which includes the following long-term medication(s): calcium 600+d3 plus minerals, gabapentin, omeprazole, prazosin, ropinirole, sertraline, and simethicone.  Pharmacotherapy (Medications Ordered): Meds ordered this encounter  Medications   oxyCODONE (OXY IR/ROXICODONE) 5 MG immediate release tablet    Sig: Take 1 tablet (5 mg total) by mouth every 8 (eight) hours as needed for moderate pain (pain score 4-6).    Dispense:  90 tablet    Refill:  0   buprenorphine (BUTRANS) 10 MCG/HR PTWK    Sig: Place 1 patch onto the skin every Monday.    Dispense:  4 patch    Refill:  2   Orders:  Orders Placed This Encounter  Procedures   GREATER OCCIPITAL NERVE BLOCK    Standing Status:   Standing    Number of Occurrences:   2    Next Expected Occurrence:   02/23/2024    Expiration Date:   06/03/2024    Scheduling Instructions:     Procedure: Occipital nerve block     Laterality: Bilateral  Sedation: Patient's choice.     Timeframe: PRN    Where will this procedure be performed?:   [facility]   Follow-up plan:   Return in about 3 months (around 03/04/2024) for MM, F2F.      Recent Visits Date Type Provider Dept  09/12/23 Office Visit Edward Jolly, MD [facility]  Showing recent visits within past 90 days and meeting all other requirements Today's Visits Date Type Provider Dept  12/05/23 Office Visit Edward Jolly, MD [facility]  Showing today's visits and meeting all other requirements Future Appointments Date Type Provider Dept  02/27/24 Appointment Edward Jolly, MD [facility]  Showing future appointments within next 90 days and meeting all other requirements  I discussed the assessment and treatment plan with the patient. The patient was provided an  opportunity to ask questions and all were answered. The patient agreed with the plan and demonstrated an understanding of the instructions.  Patient advised to call back or seek an in-person evaluation if the symptoms or condition worsens.  Duration of encounter: 30 minutes.  Total time on encounter, as per AMA guidelines included both the face-to-face and non-face-to-face time personally spent by the physician and/or other qualified health care professional(s) on the day of the encounter (includes time in activities that require the physician or other qualified health care professional and does not include time in activities normally performed by clinical staff). Physician's time may include the following activities when performed: Preparing to see the patient (e.g., pre-charting review of records, searching for previously ordered imaging, lab work, and nerve conduction tests) Review of prior analgesic pharmacotherapies. Reviewing PMP Interpreting ordered tests (e.g., lab work, imaging, nerve conduction tests) Performing post-procedure evaluations, including interpretation of diagnostic procedures Obtaining and/or reviewing separately obtained history Performing a medically appropriate examination and/or evaluation Counseling and educating the patient/family/caregiver Ordering medications, tests, or procedures Referring and communicating with other health care professionals (when not separately reported) Documenting clinical information in the electronic or other health record Independently interpreting results (not separately reported) and communicating results to the patient/ family/caregiver Care coordination (not separately reported)  Note by: Edward Jolly, MD Date: 12/05/2023; Time: 12:04 PM

## 2024-01-29 ENCOUNTER — Telehealth: Payer: Self-pay | Admitting: Student in an Organized Health Care Education/Training Program

## 2024-01-29 NOTE — Telephone Encounter (Signed)
 Patient has some questions about personal accommodations for her GONB.

## 2024-01-29 NOTE — Telephone Encounter (Signed)
 All questions answered

## 2024-01-29 NOTE — Telephone Encounter (Signed)
 Attempted to call patient, no answer, no voice mail

## 2024-02-18 ENCOUNTER — Emergency Department
Admission: EM | Admit: 2024-02-18 | Discharge: 2024-02-18 | Disposition: A | Discharge status: Home / Self Care | Attending: Emergency Medicine | Admitting: Emergency Medicine

## 2024-02-18 ENCOUNTER — Other Ambulatory Visit: Payer: Self-pay

## 2024-02-18 ENCOUNTER — Emergency Department

## 2024-02-18 DIAGNOSIS — R109 Unspecified abdominal pain: Secondary | ICD-10-CM | POA: Diagnosis present

## 2024-02-18 DIAGNOSIS — R1032 Left lower quadrant pain: Secondary | ICD-10-CM | POA: Insufficient documentation

## 2024-02-18 LAB — COMPREHENSIVE METABOLIC PANEL WITH GFR
ALT: 16 U/L (ref 0–44)
AST: 21 U/L (ref 15–41)
Albumin: 3.9 g/dL (ref 3.5–5.0)
Alkaline Phosphatase: 68 U/L (ref 38–126)
Anion gap: 7 (ref 5–15)
BUN: 16 mg/dL (ref 8–23)
CO2: 27 mmol/L (ref 22–32)
Calcium: 9.1 mg/dL (ref 8.9–10.3)
Chloride: 104 mmol/L (ref 98–111)
Creatinine, Ser: 0.71 mg/dL (ref 0.44–1.00)
GFR, Estimated: >60 mL/min (ref >60)
Glucose, Bld: 112 mg/dL — ABNORMAL HIGH (ref 70–99)
Potassium: 3.6 mmol/L (ref 3.5–5.1)
Sodium: 138 mmol/L (ref 135–145)
Total Bilirubin: 0.2 mg/dL (ref 0.0–1.2)
Total Protein: 6.4 g/dL — ABNORMAL LOW (ref 6.5–8.1)

## 2024-02-18 LAB — CBC WITH DIFFERENTIAL/PLATELET
Abs Immature Granulocytes: 0.01 10*3/uL (ref 0.00–0.07)
Basophils Absolute: 0.1 10*3/uL (ref 0.0–0.1)
Basophils Relative: 1 %
Eosinophils Absolute: 0.2 10*3/uL (ref 0.0–0.5)
Eosinophils Relative: 4 %
HCT: 27.9 % — ABNORMAL LOW (ref 36.0–46.0)
Hemoglobin: 10.1 g/dL — ABNORMAL LOW (ref 12.0–15.0)
Immature Granulocytes: 0 %
Lymphocytes Relative: 35 %
Lymphs Abs: 1.9 10*3/uL (ref 0.7–4.0)
MCH: 33.6 pg (ref 26.0–34.0)
MCHC: 36.2 g/dL — ABNORMAL HIGH (ref 30.0–36.0)
MCV: 92.7 fL (ref 80.0–100.0)
Monocytes Absolute: 0.6 10*3/uL (ref 0.1–1.0)
Monocytes Relative: 11 %
Neutro Abs: 2.7 10*3/uL (ref 1.7–7.7)
Neutrophils Relative %: 49 %
Platelets: 240 10*3/uL (ref 150–400)
RBC: 3.01 MIL/uL — ABNORMAL LOW (ref 3.87–5.11)
RDW: 12.8 % (ref 11.5–15.5)
WBC: 5.4 10*3/uL (ref 4.0–10.5)
nRBC: 0 % (ref 0.0–0.2)

## 2024-02-18 LAB — LIPASE, BLOOD: Lipase: 29 U/L (ref 11–51)

## 2024-02-18 MED ORDER — MORPHINE SULFATE (PF) 4 MG/ML IV SOLN
4.0000 mg | Freq: Once | INTRAVENOUS | Status: AC
  Administered 2024-02-18: 4 mg via INTRAVENOUS
  Filled 2024-02-18: qty 1

## 2024-02-18 MED ORDER — IOHEXOL 300 MG/ML  SOLN
75.0000 mL | Freq: Once | INTRAMUSCULAR | Status: AC | PRN
Start: 2024-02-18 — End: 2024-02-18
  Administered 2024-02-18: 75 mL via INTRAVENOUS

## 2024-02-18 NOTE — Discharge Instructions (Signed)
 You are seen in the urgency department for pain.  You had lab work done that did not show any significant abnormalities.  You had a CT scan of your abdomen and pelvis and a CT scan of your back that did not show any new findings to explain your pain.  Call your primary care physician to schedule a close follow-up appointment.  Return to the emergency department if you have any ongoing or worsening symptoms.

## 2024-02-18 NOTE — ED Notes (Signed)
 Ambulated patient to bathroom with walker and no assistance

## 2024-02-18 NOTE — ED Notes (Signed)
 Pt states she has generalized pain "all over" that comes and goes describes the pain as "inconsistent" and "worse than her normal"

## 2024-02-18 NOTE — ED Provider Notes (Signed)
 [facility] Provider Note    Event Date/Time   First MD Initiated Contact with Patient 02/18/24 [phone]     (approximate)   History   No chief complaint on file.   HPI  Sandra Mcconnell is a 83 year old female with history of chronic pain presenting to the emergency department for evaluation of left flank and abdominal pain.  Patient reports a history of chronic neck, lower back, hip pain.  More recently she has had episodes of pain that seem to be in her abdomen and flank.  No fevers or chills.  No nausea, vomiting.  Her pain waxes and wanes, is concerned that her multimodal pain regimen is not fully treating her pain.  No new bowel or bladder symptoms.  No new numbness, tingling or focal weakness.  No recent trauma.  Reviewed patient's medication list.  Include Suboxone, oxycodone, tizanidine.     Physical Exam   Triage Vital Signs: ED Triage Vitals [02/18/24 0551]  Encounter Vitals Group     BP 118/70     Systolic BP Percentile      Diastolic BP Percentile      Pulse Rate 65     Resp 17     Temp 98 F (36.7 C)     Temp Source Oral     SpO2 100 %     Weight 126 lb (57.2 kg)     Height 4' 9.5" (1.461 m)     Head Circumference      Peak Flow      Pain Score      Pain Loc      Pain Education      Exclude from Growth Chart     Most recent vital signs: Vitals:   02/18/24 0551  BP: 118/70  Pulse: 65  Resp: 17  Temp: 98 F (36.7 C)  SpO2: 100%     General: Awake, interactive  CV:  Regular rate, good peripheral perfusion.  Resp:  Unlabored respirations, lungs clear to auscultation Abd:  Nondistended, soft, inconsistent exam but frequently tender to palpation most notably over the left side of the abdomen without rebound or guarding Neuro:  Symmetric facial movement, fluid speech, 5 out of 5 strength in the bilateral upper and lower extremities   ED Results / Procedures / Treatments   Labs (all labs ordered are listed, but only abnormal  results are displayed) Labs Reviewed  CBC WITH DIFFERENTIAL/PLATELET - Abnormal; Notable for the following components:      Result Value   RBC 3.01 (*)    Hemoglobin 10.1 (*)    HCT 27.9 (*)    MCHC 36.2 (*)    All other components within normal limits  COMPREHENSIVE METABOLIC PANEL WITH GFR  LIPASE, BLOOD     EKG EKG independently reviewed interpreted by myself (ER attending) demonstrates:    RADIOLOGY Imaging independently reviewed and interpreted by myself demonstrates:  CT abdomen pelvis pending  Formal Radiology Read:  No results found.  PROCEDURES:  Critical Care performed: No  Procedures   MEDICATIONS ORDERED IN ED: Medications  morphine (PF) 4 MG/ML injection 4 mg (4 mg Intravenous Given 02/18/24 0631)     IMPRESSION / MDM / ASSESSMENT AND PLAN / ED COURSE  I reviewed the triage vital signs and the nursing notes.  Differential diagnosis includes, but is not limited to, exacerbation of chronic pain, pancreatitis, colitis, diverticulitis, renal stone, other acute intra-abdominal process, no clinical history or exam findings suggestive of cauda equina  or other acute spinal cord pathology  Patient's presentation is most consistent with acute presentation with potential threat to life or bodily function.  83 year old female presenting with abdominal and flank pain.  Stable vitals on exam.  Will obtain abdominal pain workup including labs, CT.  Discussed that if her workup is reassuring, her symptoms may be related to an exacerbation of her chronic pain and will likely need to follow-up with her pain management specialist to further discuss her multimodal pain control regimen.  7:04 AM Signed out to oncoming physician pending completion of labs and CT.  If testing is reassuring, suspect patient will be stable for discharge with outpatient follow-up regarding her pain control regimen.      FINAL CLINICAL IMPRESSION(S) / ED DIAGNOSES   Final diagnoses:  Left  lower quadrant abdominal pain     Rx / DC Orders   ED Discharge Orders     None        Note:  This document was prepared using Dragon voice recognition software and may include unintentional dictation errors.   Claria Crofts, MD 02/18/24 [phone]

## 2024-02-18 NOTE — ED Triage Notes (Signed)
 POV with CC of generalized discomfort that has been ongoing x2 weeks that pt says is sometimes related to her lower back pain and extensive "spinal history". A&Ox4. Ambulating with walker to room.

## 2024-02-18 NOTE — ED Provider Notes (Addendum)
 Care assumed of patient from outgoing provider.  See their note for initial history, exam and plan.  Clinical Course as of 02/18/24 0745  Sun Feb 18, 2024  0739 Patient with past medical history significant for chronic pain who presents to the emergency department with multiple vague complaints of abdominal pain and pain in her body.  Abdomen was tender initially.  Plan for lab work and CT imaging.  If lab work and CT scan is reassuring likely will build to discharge home with outpatient follow-up.  Given IV morphine for pain control.  Lab work reassuring. [SM]    Clinical Course User Index [SM] Viviano Ground, MD   CT scan without acute intra-abdominal pathology.  Chronic findings have been stable or improved from 2016.  CT scan of the L-spine was added on with no signs of new fracture or dislocation.  On reevaluation patient without any new complaints.  Concern for ongoing chronic pain.  Patient is here with her husband and they will follow-up as an outpatient with Dr. Erminio Hazy who manages her pain and her primary care provider.  Discussed return to the emergency department for any new or worsening symptoms.   Viviano Ground, MD 02/18/24 Drake Gens    Viviano Ground, MD 02/18/24 [phone]

## 2024-02-21 ENCOUNTER — Ambulatory Visit: Admitting: Student in an Organized Health Care Education/Training Program

## 2024-02-27 ENCOUNTER — Encounter: Payer: Self-pay | Admitting: Student in an Organized Health Care Education/Training Program

## 2024-02-27 ENCOUNTER — Ambulatory Visit
Payer: Medicare PPO | Attending: Student in an Organized Health Care Education/Training Program | Admitting: Student in an Organized Health Care Education/Training Program

## 2024-02-27 ENCOUNTER — Other Ambulatory Visit: Payer: Self-pay | Admitting: *Deleted

## 2024-02-27 VITALS — BP 124/66 | HR 85 | Temp 97.5°F | Resp 16 | Ht <= 58 in | Wt 126.0 lb

## 2024-02-27 DIAGNOSIS — M1712 Unilateral primary osteoarthritis, left knee: Secondary | ICD-10-CM | POA: Insufficient documentation

## 2024-02-27 DIAGNOSIS — M5481 Occipital neuralgia: Secondary | ICD-10-CM | POA: Diagnosis not present

## 2024-02-27 DIAGNOSIS — G894 Chronic pain syndrome: Secondary | ICD-10-CM | POA: Diagnosis not present

## 2024-02-27 DIAGNOSIS — Z79891 Long term (current) use of opiate analgesic: Secondary | ICD-10-CM | POA: Diagnosis present

## 2024-02-27 DIAGNOSIS — M47812 Spondylosis without myelopathy or radiculopathy, cervical region: Secondary | ICD-10-CM | POA: Diagnosis present

## 2024-02-27 DIAGNOSIS — M5416 Radiculopathy, lumbar region: Secondary | ICD-10-CM | POA: Diagnosis present

## 2024-02-27 MED ORDER — OXYCODONE HCL 5 MG PO TABS: 5.0000 mg | ORAL_TABLET | Freq: Two times a day (BID) | ORAL | 0 refills | Status: AC | PRN

## 2024-02-27 MED ORDER — BUPRENORPHINE 15 MCG/HR TD PTWK: 1.0000 | MEDICATED_PATCH | TRANSDERMAL | 2 refills | Status: DC

## 2024-02-27 MED ORDER — OXYCODONE HCL 5 MG PO TABS: 5.0000 mg | ORAL_TABLET | Freq: Two times a day (BID) | ORAL | 0 refills | Status: DC | PRN

## 2024-02-27 MED ORDER — OXYCODONE HCL 5 MG PO TABS: 5.0000 mg | ORAL_TABLET | Freq: Two times a day (BID) | ORAL | 0 refills | Status: AC

## 2024-02-27 MED ORDER — GABAPENTIN 800 MG PO TABS: 800.0000 mg | ORAL_TABLET | Freq: Three times a day (TID) | ORAL | 5 refills | Status: DC

## 2024-02-27 MED ORDER — TIZANIDINE HCL 2 MG PO TABS
1.0000 mg | ORAL_TABLET | Freq: Four times a day (QID) | ORAL | 5 refills | Status: DC
Start: 2024-02-27 — End: 2024-08-13

## 2024-02-27 NOTE — Progress Notes (Signed)
 PROVIDER NOTE: Interpretation of information contained herein should be left to medically-trained personnel. Specific patient instructions are provided elsewhere under "Patient Instructions" section of medical record. This document was created in part using AI and STT-dictation technology, any transcriptional errors that may result from this process are unintentional.  Patient: Sandra Mcconnell  Service: E/M   PCP: Rory Collard, MD  DOB: Dec 07, 1940  DOS: 02/27/2024  Provider: Cherylin Corrigan, NP  MRN: [id number]  Delivery: Face-to-face  Specialty: Interventional Pain Management  Type: Established Patient  Setting: Ambulatory outpatient facility  Specialty designation: 09  Referring Prov.: Rory Collard, MD  Location: Outpatient office facility       HPI  [name], a 83 y.o. year old female, is here today because of her Bilateral occipital neuralgia [M54.81]. Ms. Wale's primary complain today is Back Pain (Upper, middle, lower)   Pain Assessment: Severity of Chronic pain is reported as a 9 /10. Location: Back Upper, Mid, Lower/ . Onset: More than a month ago. Quality: Sore. Timing: Constant. Modifying factor(s): medications. Vitals:  height is 4\' 10"  (1.473 m) and weight is 126 lb (57.2 kg). Her temporal temperature is 97.5 F (36.4 C) (abnormal). Her blood pressure is 124/66 and her pulse is 85. Her respiration is 16 and oxygen saturation is 100%.  BMI: Estimated body mass index is 26.33 kg/m as calculated from the following:   Height as of this encounter: 4\' 10"  (1.473 m).   Weight as of this encounter: 126 lb (57.2 kg). Last encounter: 01/29/2024.  Reason for encounter: medication management.  The patient reports generalized back pain involving the upper, middle, and lower regions.  However, see indicates the pain is more pronounced in the left posterior medial region.  Increase Butrans  patch from 10 mcg mcg to 15 mcg to better manage baseline pain.  Prescribed oxycodone  IR 5 Mg twice  daily as needed for breakthrough pain.  The patient was admitted to the emergency room for evaluation of multiple [place] pain complaints.  During her ER stay a CT scan of the lumbar spine and a CT scan of the abdominal and pelvis were performed.  Prescribing drug monitoring (PDMP) consistent with the prescribed medication.  Pharmacotherapy Assessment  Analgesic: Oxycodone  (Oxy IR/Roxicodone ) 5 mg immediate release 2 times daily as needed. MME=22.50 Buprenorphine  (Butrans ) 15 mcg/hr weekly patch  Monitoring: [place] PMP: PDMP reviewed during this encounter.       Pharmacotherapy: No side-effects or adverse reactions reported. Compliance: No problems identified. Effectiveness: Clinically acceptable.  Calton Catholic, RN  02/27/2024 11:01 AM  Sign when Signing Visit Nursing Pain Medication Assessment:  Safety precautions to be maintained throughout the outpatient stay will include: orient to surroundings, keep bed in low position, maintain call bell within reach at all times, provide assistance with transfer out of bed and ambulation.  Medication Inspection Compliance: Pill count conducted under aseptic conditions, in front of the patient. Neither the pills nor the bottle was removed from the patient's sight at any time. Once count was completed pills were immediately returned to the patient in their original bottle.  Medication: Oxycodone  IR Pill/Patch Count:  33 of 90 pills remain Pill/Patch Appearance: Markings consistent with prescribed medication Bottle Appearance: Standard pharmacy container. Clearly labeled. Filled Date: 02 / 25 / 2025 Last Medication intake:  TodaySafety precautions to be maintained throughout the outpatient stay will include: orient to surroundings, keep bed in low position, maintain call bell within reach at all times, provide assistance with transfer out of  bed and ambulation.     No results found for: "CBDTHCR" No results found for: "D8THCCBX" No results found for:  "D9THCCBX"  UDS:  Summary  Date Value Ref Range Status  08/03/2023 Note  Final    Comment:    ==================================================================== Compliance Drug Analysis, Ur ==================================================================== Test                             Result       Flag       Units  Drug Present and Declared for Prescription Verification   Oxymorphone                    48           EXPECTED   ng/mg creat   Noroxycodone                   718          EXPECTED   ng/mg creat   Noroxymorphone                 326          EXPECTED   ng/mg creat    Oxymorphone, noroxycodone and noroxymorphone are expected    metabolites of oxycodone . Noroxymorphone is an expected metabolite    of oxymorphone. Sources of oxycodone  and/or oxymorphone include    scheduled prescription medications.    Buprenorphine                   7            EXPECTED   ng/mg creat   Norbuprenorphine               6            EXPECTED   ng/mg creat    Source of buprenorphine  is a scheduled prescription medication.    Norbuprenorphine is an expected metabolite of buprenorphine .    Gabapentin                      PRESENT      EXPECTED   Sertraline                      PRESENT      EXPECTED   Desmethylsertraline            PRESENT      EXPECTED    Desmethylsertraline is an expected metabolite of sertraline .    Acetaminophen                   PRESENT      EXPECTED  Drug Absent but Declared for Prescription Verification   Oxycodone                       Not Detected UNEXPECTED ng/mg creat    Oxycodone  is almost always present in patients taking this drug    consistently.  Absence of oxycodone  could be due to lapse of time    since the last dose or unusual pharmacokinetics (rapid metabolism).    Tizanidine                      Not Detected UNEXPECTED    Tizanidine , as indicated in the declared medication list, is not    always detected even when used as directed.    Quetiapine   Not Detected UNEXPECTED   Diclofenac                     Not Detected UNEXPECTED    Diclofenac, as indicated in the declared medication list, is not    always detected even when used as directed.    Salicylate                     Not Detected UNEXPECTED    Aspirin, as indicated in the declared medication list, is not always    detected even when used as directed.    Lidocaine                       Not Detected UNEXPECTED    Lidocaine , as indicated in the declared medication list, is not    always detected even when used as directed.  ==================================================================== Test                      Result    Flag   Units      Ref Range   Creatinine              169              mg/dL      >=91 ==================================================================== Declared Medications:  The flagging and interpretation on this report are based on the  following declared medications.  Unexpected results may arise from  inaccuracies in the declared medications.   **Note: The testing scope of this panel includes these medications:   Gabapentin  (Neurontin )  Oxycodone   Quetiapine  (Seroquel )  Sertraline  (Zoloft )   **Note: The testing scope of this panel does not include small to  moderate amounts of these reported medications:   Acetaminophen  (Tylenol )  Aspirin  Buprenorphine  Patch (BuTrans )  Diclofenac  Lidocaine  (Xylocaine )  Tizanidine    **Note: The testing scope of this panel does not include the  following reported medications:   Brimonidine (Alphagan)  Calcium  Dorzolamide (Trusopt)  Eye Drops  Fluticasone   Glucosamine  Ketorolac (Toradol)  Melatonin  Multivitamin  Omeprazole (Prilosec)  Ondansetron  (Zofran )  Prazosin  (Minipress )  Probiotic  Raloxifene (Evista)  Ropinirole  (Requip )  Simethicone   Topical ==================================================================== For clinical consultation, please call [phone]. ====================================================================       ROS  Constitutional: Denies any fever or chills Gastrointestinal: No reported hemesis, hematochezia, vomiting, or acute GI distress Musculoskeletal: Low back pain (upper, middle, lumbar region) (L>R) Neurological: No reported episodes of acute onset apraxia, aphasia, dysarthria, agnosia, amnesia, paralysis, loss of coordination, or loss of consciousness  Medication Review  Calcium 600+D3 Plus Minerals, Ginger Root, Glucosamine Sulfate, Multiple Vitamins, Simethicone , acetaminophen , aspirin EC, brimonidine, buprenorphine , cyclopentolate , dorzolamide, fluticasone , gabapentin , ketorolac, melatonin, omeprazole, ondansetron , oxyCODONE , prazosin , rOPINIRole , raloxifene, sertraline , and tiZANidine   History Review  Allergy: Ms. Cantin is allergic to aripiprazole, ciprofloxacin, mirtazapine, and penicillins. Drug: Ms. Oswald  reports no history of drug use. Alcohol:  reports no history of alcohol use. Tobacco:  reports that she has never smoked. She has never used smokeless tobacco. Social: Ms. Stanislawski  reports that she has never smoked. She has never used smokeless tobacco. She reports that she does not drink alcohol and does not use drugs. Medical:  has a past medical history of Anemia (08/04/2017), Anxiety, Carotid artery disease (HCC), Carpal tunnel syndrome, Cataract cortical, senile, Cervical spinal cord compression (HCC), Chicken pox, Colitis, Complication of anesthesia, Depression, Dizziness, DJD (degenerative joint disease), Dysphagia, GERD (gastroesophageal reflux disease),  Hallux rigidus, Hemorrhoids, History of hiatal hernia, History of palpitations, HLD (hyperlipidemia), Hypertension, IBS (irritable bowel syndrome), Iritis, Lactose intolerance, Migraines, Neuropathy, OCD (obsessive compulsive disorder), OSA on CPAP, Osteoarthritis, Osteoporosis, Panic attacks, PONV (postoperative nausea and vomiting), Psoriasis,  PTSD (post-traumatic stress disorder), Redundant colon, REM sleep behavior disorder, Rosacea, Slow transit constipation, Spondylosis, and Stroke (HCC) (08/2000). Surgical: Ms. Biel  has a past surgical history that includes Hand surgery; Spine surgery; Tonsillectomy; Abdominal hysterectomy; Back surgery; Foot Fusion (Bilateral); Eye surgery; TORUS PALATINUS (2007); Cataract extraction w/PHACO (Right, 12/08/2015); Colonoscopy; Esophagogastroduodenoscopy; Esophagogastroduodenoscopy (N/A, 11/15/2017); Colonoscopy with propofol  (N/A, 11/15/2017); Intramedullary (im) nail intertrochanteric (Left, 11/04/2020); and Hardware Removal (Left, 04/29/2021). Family: family history includes Breast cancer in her mother.  Laboratory Chemistry Profile   Renal Lab Results  Component Value Date   BUN 16 02/18/2024   CREATININE 0.71 02/18/2024   GFRAA >60 09/06/2015   GFRNONAA >60 02/18/2024    Hepatic Lab Results  Component Value Date   AST 21 02/18/2024   ALT 16 02/18/2024   ALBUMIN 3.9 02/18/2024   ALKPHOS 68 02/18/2024   LIPASE 29 02/18/2024    Electrolytes Lab Results  Component Value Date   NA 138 02/18/2024   K 3.6 02/18/2024   CL 104 02/18/2024   CALCIUM 9.1 02/18/2024   MG 1.9 11/12/2020    Bone No results found for: "VD25OH", "VD125OH2TOT", "WU9811BJ4", "NW2956OZ3", "25OHVITD1", "25OHVITD2", "25OHVITD3", "TESTOFREE", "TESTOSTERONE"  Inflammation (CRP: Acute Phase) (ESR: Chronic Phase) No results found for: "CRP", "ESRSEDRATE", "LATICACIDVEN"       Note: Above Lab results reviewed.  Recent Imaging Review  CT L-SPINE NO CHARGE CLINICAL DATA:  Chronic pain with vague abdominal pain in tenderness  EXAM: CT LUMBAR SPINE WITHOUT CONTRAST  TECHNIQUE: Multidetector CT imaging of the lumbar spine was performed without intravenous contrast administration. Multiplanar CT image reconstructions were also generated.  RADIATION DOSE REDUCTION: This exam was performed according to the departmental  dose-optimization program which includes automated exposure control, adjustment of the mA and/or kV according to patient size and/or use of iterative reconstruction technique.  COMPARISON:  None Available.  FINDINGS: Segmentation: 5 lumbar type vertebrae  Alignment: Mild degenerative anterolisthesis at L3-4 and L4-5. Mild scoliosis  Vertebrae: Remote L1 superior endplate fracture with moderate height loss. Subjective generalized osteopenia. No acute fracture  Paraspinal and other soft tissues: No perispinal inflammation or masslike finding seen  Disc levels: Generalized disc narrowing and endplate degeneration, disc collapse greatest at T10-11, T11-12, and L4-5. Diffuse degenerative facet spurring as well. Mild to moderate spinal canal narrowing at L2-3 to L4-5. Notable foraminal impingement on the right at L4-5. Moderate left foraminal narrowing at L1-2 and L2-3.  IMPRESSION: No acute finding.  Remote L1 compression fracture.  Advanced lumbar spine degeneration with scoliosis and listhesis as described.  Electronically Signed   By: Ronnette Coke M.D.   On: 02/18/2024 08:50 CT ABDOMEN PELVIS W CONTRAST CLINICAL DATA:  Abdominal pain, acute in nonlocalized.  EXAM: CT ABDOMEN AND PELVIS WITH CONTRAST  TECHNIQUE: Multidetector CT imaging of the abdomen and pelvis was performed using the standard protocol following bolus administration of intravenous contrast.  RADIATION DOSE REDUCTION: This exam was performed according to the departmental dose-optimization program which includes automated exposure control, adjustment of the mA and/or kV according to patient size and/or use of iterative reconstruction technique.  CONTRAST:  75mL OMNIPAQUE  IOHEXOL  300 MG/ML  SOLN  COMPARISON:  09/06/2015  FINDINGS: Lower chest:  No contributory findings.  Hepatobiliary: Subcutaneous mass in the right lobe liver  measuring 16 mm, contracted from before when there was more typical  hemangioma features, interval involution/sclerosis. Accentuated vessels in segment 4B likely from shunt. No acute or interval finding.  Pancreas: Assessment is limited by motion. No acute or aggressive finding.  Spleen: Unremarkable.  Adrenals/Urinary Tract: Negative adrenals. No hydronephrosis or stone. Cystic lesion in the interpolar right kidney measuring 12 mm on the delayed phase, some internal septation or peripheral enhancement is present but stable if not progressed size since 2016, non worrisome especially for patient age. Unremarkable bladder.  Stomach/Bowel: Tortuous colon with extensive stool retention but no obstruction or over distention. No bowel inflammation seen throughout the abdomen. No visible appendix.  Vascular/Lymphatic: No acute vascular abnormality. Diffuse atheromatous calcification no mass or adenopathy.  Reproductive:No pathologic findings.  Hysterectomy.  Other: No ascites or pneumoperitoneum.  Musculoskeletal: No acute abnormalities. Remote L1 compression fracture. Advanced lumbar spine degeneration. Prior left proximal femur nail fixation and cerclage wire.  IMPRESSION: No acute finding. Chronic findings are stable or regressed from 2016 and noted above.  Electronically Signed   By: Ronnette Coke M.D.   On: 02/18/2024 08:25 Note: Reviewed        Physical Exam  General appearance: Well nourished, well developed, and well hydrated. In no apparent acute distress Mental status: Alert, oriented x 3 (person, place, & time)       Respiratory: No evidence of acute respiratory distress Eyes: PERLA Vitals: BP 124/66 (Cuff Size: Normal)   Pulse 85   Temp (!) 97.5 F (36.4 C) (Temporal)   Resp 16   Ht 4\' 10"  (1.473 m)   Wt 126 lb (57.2 kg)   SpO2 100%   BMI 26.33 kg/m  BMI: Estimated body mass index is 26.33 kg/m as calculated from the following:   Height as of this encounter: 4\' 10"  (1.473 m).   Weight as of this encounter: 126 lb (57.2  kg). Ideal: Patient must be at least 60 in tall to calculate ideal body weight  Generalized back pain worse with movement Assessment   Diagnosis Status  1. Bilateral occipital neuralgia   2. Chronic pain syndrome   3. Arthritis of left knee   4. Lumbar radiculopathy   5. Cervical spondylosis   6. Encounter for long-term opiate analgesic use    Having a Flare-up Having a Flare-up Having a Flare-up   Plan of Care  Assessment and Plan Increase Butrans  patch from 10 mcg mcg to 15 mcg to better manage baseline pain.  Prescribed oxycodone  IR 5 Mg twice daily as needed for breakthrough pain.  The patient is scheduled for greater occipital nerve block with Dr. Rhesa Celeste to address her chronic headache or upper neck pain symptoms.   Pharmacotherapy (Medications Ordered): Meds ordered this encounter  Medications   gabapentin  (NEURONTIN ) 800 MG tablet    Sig: Take 1 tablet (800 mg total) by mouth 3 (three) times daily.    Dispense:  90 tablet    Refill:  5   buprenorphine  (BUTRANS ) 15 MCG/HR    Sig: Place 1 patch onto the skin once a week.    Dispense:  4 patch    Refill:  2    Chronic Pain: STOP Act (Not applicable) Fill 1 day early if closed on refill date. Avoid benzodiazepines within 8 hours of opioids   oxyCODONE  (OXY IR/ROXICODONE ) 5 MG immediate release tablet    Sig: Take 1 tablet (5 mg total) by mouth 2 (two) times daily as needed for severe pain (pain score 7-10).  Must last 30 days.    Dispense:  60 tablet    Refill:  0    Chronic Pain: STOP Act (Not applicable) Fill 1 day early if closed on refill date. Avoid benzodiazepines within 8 hours of opioids   oxyCODONE  (OXY IR/ROXICODONE ) 5 MG immediate release tablet    Sig: Take 1 tablet (5 mg total) by mouth in the morning and at bedtime. Must last 30 days.    Dispense:  60 tablet    Refill:  0    Chronic Pain: STOP Act (Not applicable) Fill 1 day early if closed on refill date. Avoid benzodiazepines within 8 hours of opioids    oxyCODONE  (OXY IR/ROXICODONE ) 5 MG immediate release tablet    Sig: Take 1 tablet (5 mg total) by mouth 2 (two) times daily as needed for severe pain (pain score 7-10). Must last 30 days.    Dispense:  60 tablet    Refill:  0    Chronic Pain: STOP Act (Not applicable) Fill 1 day early if closed on refill date. Avoid benzodiazepines within 8 hours of opioids   tiZANidine  (ZANAFLEX ) 2 MG tablet    Sig: Take 0.5 tablets (1 mg total) by mouth 4 (four) times daily.    Dispense:  60 tablet    Refill:  5   Orders:  No orders of the defined types were placed in this encounter.  Follow-up plan:   Return in about 3 months (around 05/28/2024) for (F2F), (MM), Marthe Slain NP.    Recent Visits Date Type Provider Dept  12/05/23 Office Visit Cephus Collin, MD [facility]  Showing recent visits within past 90 days and meeting all other requirements Today's Visits Date Type Provider Dept  02/27/24 Office Visit Cephus Collin, MD [facility]  Showing today's visits and meeting all other requirements Future Appointments Date Type Provider Dept  03/06/24 Appointment Cephus Collin, MD [facility]  05/21/24 Appointment Keishaun Hazel K, NP [facility]  Showing future appointments within next 90 days and meeting all other requirements  I discussed the assessment and treatment plan with the patient. The patient was provided an opportunity to ask questions and all were answered. The patient agreed with the plan and demonstrated an understanding of the instructions.  Patient advised to call back or seek an in-person evaluation if the symptoms or condition worsens.  Duration of encounter: 30 minutes.  Total time on encounter, as per AMA guidelines included both the face-to-face and non-face-to-face time personally spent by the physician and/or other qualified health care professional(s) on the day of the encounter (includes time in activities that require the physician or  other qualified health care professional and does not include time in activities normally performed by clinical staff). Physician's time may include the following activities when performed: Preparing to see the patient (e.g., pre-charting review of records, searching for previously ordered imaging, lab work, and nerve conduction tests) Review of prior analgesic pharmacotherapies. Reviewing PMP Interpreting ordered tests (e.g., lab work, imaging, nerve conduction tests) Performing post-procedure evaluations, including interpretation of diagnostic procedures Obtaining and/or reviewing separately obtained history Performing a medically appropriate examination and/or evaluation Counseling and educating the patient/family/caregiver Ordering medications, tests, or procedures Referring and communicating with other health care professionals (when not separately reported) Documenting clinical information in the electronic or other health record Independently interpreting results (not separately reported) and communicating results to the patient/ family/caregiver Care coordination (not separately reported)  Note by: Dylynn Ketner K Fumiko Cham, NP (TTS and  AI technology used. I apologize for any typographical errors that were not detected and corrected.) Date: 02/27/2024; Time: 12:16 PM

## 2024-02-27 NOTE — Progress Notes (Signed)
 Nursing Pain Medication Assessment:  Safety precautions to be maintained throughout the outpatient stay will include: orient to surroundings, keep bed in low position, maintain call bell within reach at all times, provide assistance with transfer out of bed and ambulation.  Medication Inspection Compliance: Pill count conducted under aseptic conditions, in front of the patient. Neither the pills nor the bottle was removed from the patient's sight at any time. Once count was completed pills were immediately returned to the patient in their original bottle.  Medication: Oxycodone  IR Pill/Patch Count:  33 of 90 pills remain Pill/Patch Appearance: Markings consistent with prescribed medication Bottle Appearance: Standard pharmacy container. Clearly labeled. Filled Date: 02 / 25 / 2025 Last Medication intake:  TodaySafety precautions to be maintained throughout the outpatient stay will include: orient to surroundings, keep bed in low position, maintain call bell within reach at all times, provide assistance with transfer out of bed and ambulation.

## 2024-03-06 ENCOUNTER — Ambulatory Visit
Attending: Student in an Organized Health Care Education/Training Program | Admitting: Student in an Organized Health Care Education/Training Program

## 2024-03-06 ENCOUNTER — Encounter: Payer: Self-pay | Admitting: Student in an Organized Health Care Education/Training Program

## 2024-03-06 VITALS — BP 141/79 | HR 92 | Temp 98.2°F | Resp 18 | Ht <= 58 in | Wt 128.0 lb

## 2024-03-06 DIAGNOSIS — G894 Chronic pain syndrome: Secondary | ICD-10-CM | POA: Diagnosis not present

## 2024-03-06 DIAGNOSIS — M5481 Occipital neuralgia: Secondary | ICD-10-CM

## 2024-03-06 MED ORDER — DEXAMETHASONE SODIUM PHOSPHATE 10 MG/ML IJ SOLN
10.0000 mg | Freq: Once | INTRAMUSCULAR | Status: AC
  Administered 2024-03-06: 10 mg
  Filled 2024-03-06: qty 1

## 2024-03-06 MED ORDER — LIDOCAINE HCL 2 % IJ SOLN
20.0000 mL | Freq: Once | INTRAMUSCULAR | Status: AC
  Administered 2024-03-06: 400 mg
  Filled 2024-03-06: qty 40

## 2024-03-06 MED ORDER — ROPIVACAINE HCL 2 MG/ML IJ SOLN
9.0000 mL | Freq: Once | INTRAMUSCULAR | Status: AC
  Administered 2024-03-06: 9 mL via PERINEURAL
  Filled 2024-03-06: qty 20

## 2024-03-06 MED ORDER — DIAZEPAM 5 MG PO TABS
5.0000 mg | ORAL_TABLET | ORAL | Status: AC
  Administered 2024-03-06: 5 mg via ORAL

## 2024-03-06 MED ORDER — LACTATED RINGERS IV SOLN: Freq: Once | INTRAVENOUS | Status: DC

## 2024-03-06 MED ORDER — MIDAZOLAM HCL 2 MG/2ML IJ SOLN: 0.5000 mg | Freq: Once | INTRAMUSCULAR | Status: DC

## 2024-03-06 MED ORDER — DIAZEPAM 5 MG PO TABS
ORAL_TABLET | ORAL | Status: AC
Start: 2024-03-06 — End: ?
  Filled 2024-03-06: qty 1

## 2024-03-06 NOTE — Progress Notes (Signed)
 PROVIDER NOTE: Interpretation of information contained herein should be left to medically-trained personnel. Specific patient instructions are provided elsewhere under "Patient Instructions" section of medical record. This document was created in part using STT-dictation technology, any transcriptional errors that may result from this process are unintentional.  Patient: Sandra Mcconnell Type: Established DOB: May 03, 1941 MRN: [id number] PCP: Rory Collard, MD  Service: Procedure DOS: 03/06/2024 Setting: Ambulatory Location: Ambulatory outpatient facility Delivery: Face-to-face Provider: Cephus Collin, MD Specialty: Interventional Pain Management Specialty designation: 09 Location: Outpatient facility Ref. Prov.: Rory Collard, MD       Interventional Therapy   Primary Reason for Visit: Interventional Pain Management Treatment. CC: Headache    Procedure:          Anesthesia, Analgesia, Anxiolysis:  Type: Diagnostic, Greater, Occipital Nerve Block  #1  Region: Posterolateral Cervical Level: Occipital Ridge   Laterality: Bilateral  Anesthesia: Local (1-2% Lidocaine )  Anxiolysis: Oral Valium  Sedation: None     Position: Prone   1. Bilateral occipital neuralgia   2. Chronic pain syndrome    NAS-11 Pain score:   Pre-procedure: 9 /10   Post-procedure: 7 /10     H&P (Pre-op Assessment):  Sandra Mcconnell is a 83 y.o. (year old), female patient, seen today for interventional treatment. She  has a past surgical history that includes Hand surgery; Spine surgery; Tonsillectomy; Abdominal hysterectomy; Back surgery; Foot Fusion (Bilateral); Eye surgery; TORUS PALATINUS (2007); Cataract extraction w/PHACO (Right, 12/08/2015); Colonoscopy; Esophagogastroduodenoscopy; Esophagogastroduodenoscopy (N/A, 11/15/2017); Colonoscopy with propofol  (N/A, 11/15/2017); Intramedullary (im) nail intertrochanteric (Left, 11/04/2020); and Hardware Removal (Left, 04/29/2021). Sandra Mcconnell has a current medication list which  includes the following prescription(s): acetaminophen , acetaminophen , aspirin ec, brimonidine, buprenorphine , calcium 600+d3 plus minerals, cyclopentolate , dorzolamide, fluticasone , gabapentin , ginger root, glucosamine sulfate, ketorolac, melatonin, multiple vitamins, omeprazole, ondansetron , oxycodone , [START ON 03/28/2024] oxycodone , [START ON 04/27/2024] oxycodone , prazosin , prazosin , raloxifene, ropinirole , sertraline , simethicone , and tizanidine , and the following Facility-Administered Medications: dexamethasone  and lidocaine . Her primarily concern today is the Headache  Initial Vital Signs:  Pulse/HCG Rate: 92ECG Heart Rate: 76 Temp: 98.2 F (36.8 C) Resp: 16 BP: 132/73 SpO2: 98 %  BMI: Estimated body mass index is 29.75 kg/m as calculated from the following:   Height as of this encounter: 4\' 7"  (1.397 m).   Weight as of this encounter: 128 lb (58.1 kg).  Risk Assessment: Allergies: Reviewed. She is allergic to aripiprazole, ciprofloxacin, mirtazapine, and penicillins.  Allergy Precautions: None required Coagulopathies: Reviewed. None identified.  Blood-thinner therapy: None at this time Active Infection(s): Reviewed. None identified. Sandra Mcconnell is afebrile  Site Confirmation: Sandra Mcconnell was asked to confirm the procedure and laterality before marking the site Procedure checklist: Completed Consent: Before the procedure and under the influence of no sedative(s), amnesic(s), or anxiolytics, the patient was informed of the treatment options, risks and possible complications. To fulfill our ethical and legal obligations, as recommended by the American Medical Association's Code of Ethics, I have informed the patient of my clinical impression; the nature and purpose of the treatment or procedure; the risks, benefits, and possible complications of the intervention; the alternatives, including doing nothing; the risk(s) and benefit(s) of the alternative treatment(s) or procedure(s); and the risk(s)  and benefit(s) of doing nothing. The patient was provided information about the general risks and possible complications associated with the procedure. These may include, but are not limited to: failure to achieve desired goals, infection, bleeding, organ or nerve damage, allergic reactions, paralysis, and death. In addition, the patient was informed of those risks  and complications associated to the procedure, such as failure to decrease pain; infection; bleeding; organ or nerve damage with subsequent damage to sensory, motor, and/or autonomic systems, resulting in permanent pain, numbness, and/or weakness of one or several areas of the body; allergic reactions; (i.e.: anaphylactic reaction); and/or death. Furthermore, the patient was informed of those risks and complications associated with the medications. These include, but are not limited to: allergic reactions (i.e.: anaphylactic or anaphylactoid reaction(s)); adrenal axis suppression; blood sugar elevation that in diabetics may result in ketoacidosis or comma; water retention that in patients with history of congestive heart failure may result in shortness of breath, pulmonary edema, and decompensation with resultant heart failure; weight gain; swelling or edema; medication-induced neural toxicity; particulate matter embolism and blood vessel occlusion with resultant organ, and/or nervous system infarction; and/or aseptic necrosis of one or more joints. Finally, the patient was informed that Medicine is not an exact science; therefore, there is also the possibility of unforeseen or unpredictable risks and/or possible complications that may result in a catastrophic outcome. The patient indicated having understood very clearly. We have given the patient no guarantees and we have made no promises. Enough time was given to the patient to ask questions, all of which were answered to the patient's satisfaction. Sandra Mcconnell has indicated that she wanted to continue  with the procedure. Attestation: I, the ordering provider, attest that I have discussed with the patient the benefits, risks, side-effects, alternatives, likelihood of achieving goals, and potential problems during recovery for the procedure that I have provided informed consent. Date  Time: 03/06/2024 11:00 AM  Pre-Procedure Preparation:  Monitoring: As per clinic protocol. Respiration, ETCO2, SpO2, BP, heart rate and rhythm monitor placed and checked for adequate function Safety Precautions: Patient was assessed for positional comfort and pressure points before starting the procedure. Time-out: I initiated and conducted the "Time-out" before starting the procedure, as per protocol. The patient was asked to participate by confirming the accuracy of the "Time Out" information. Verification of the correct person, site, and procedure were performed and confirmed by me, the nursing staff, and the patient. "Time-out" conducted as per Joint Commission's Universal Protocol (UP.01.01.01). Time: 1208 Start Time: 1208 hrs.  Description of Procedure:          Target Area: Area medial to the occipital artery at the level of the superior nuchal ridge Approach: Posterior approach Area Prepped: Entire Posterior Occipital Region ChloraPrep (2% chlorhexidine  gluconate and 70% isopropyl alcohol) Safety Precautions: Aspiration looking for blood return was conducted prior to all injections. At no point did we inject any substances, as a needle was being advanced. No attempts were made at seeking any paresthesias. Safe injection practices and needle disposal techniques used. Medications properly checked for expiration dates. SDV (single dose vial) medications used. Description of the Procedure: Protocol guidelines were followed. The target area was identified and the area prepped in the usual manner. Skin & deeper tissues infiltrated with local anesthetic. Appropriate amount of time allowed to pass for local  anesthetics to take effect. The procedure needles were then advanced to the target area. Proper needle placement secured. Negative aspiration confirmed. Solution injected in intermittent fashion, asking for systemic symptoms every 0.5cc of injectate. The needles were then removed and the area cleansed, making sure to leave some of the prepping solution back to take advantage of its long term bactericidal properties.  Vitals:   03/06/24 1122 03/06/24 1213  BP: 132/73 (!) 141/79  Pulse: 92   Resp: 16 18  Temp: 98.2 F (36.8 C)   SpO2: 98% 97%  Weight: 128 lb (58.1 kg)   Height: 4\' 7"  (1.397 m)     Start Time: 1208 hrs. End Time: 1213 hrs. Materials:  Needle(s) Type: Regular needle Gauge: 22G Length: 3.5-in Medication(s): Please see orders for medications and dosing details.  10 cc solution made of 9c of 0.2% ropivacaine, 1 cc of Decadron  10 mg/cc. 5 cc injected for the left occipital nerve 5 cc injected for the right occipital nerve    Antibiotic Prophylaxis:   Anti-infectives (From admission, onward)    None      Indication(s): None identified  Post-operative Assessment:  Post-procedure Vital Signs:  Pulse/HCG Rate: 9276 Temp: 98.2 F (36.8 C) Resp: 18 BP: (!) 141/79 SpO2: 97 %  EBL: None  Complications: No immediate post-treatment complications observed by team, or reported by patient.  Note: The patient tolerated the entire procedure well. A repeat set of vitals were taken after the procedure and the patient was kept under observation following institutional policy, for this type of procedure. Post-procedural neurological assessment was performed, showing return to baseline, prior to discharge. The patient was provided with post-procedure discharge instructions, including a section on how to identify potential problems. Should any problems arise concerning this procedure, the patient was given instructions to immediately contact us , at any time, without hesitation.  In any case, we plan to contact the patient by telephone for a follow-up status report regarding this interventional procedure.  Comments:  No additional relevant information.  Plan of Care (POC)  Orders:  No orders of the defined types were placed in this encounter.    Medications ordered for procedure: Meds ordered this encounter  Medications   lidocaine  (XYLOCAINE ) 2 % (with pres) injection 400 mg   DISCONTD: lactated ringers  infusion   DISCONTD: midazolam  (VERSED ) injection 0.5-2 mg    Make sure Flumazenil is available in the pyxis when using this medication. If oversedation occurs, administer 0.2 mg IV over 15 sec. If after 45 sec no response, administer 0.2 mg again over 1 min; may repeat at 1 min intervals; not to exceed 4 doses (1 mg)   dexamethasone  (DECADRON ) injection 10 mg   ropivacaine (PF) 2 mg/mL (0.2%) (NAROPIN) injection 9 mL   diazepam (VALIUM) tablet 5 mg    Make sure Flumazenil is available in the pyxis when using this medication. If oversedation occurs, administer 0.2 mg IV over 15 sec. If after 45 sec no response, administer 0.2 mg again over 1 min; may repeat at 1 min intervals; not to exceed 4 doses (1 mg)   Medications administered: We administered ropivacaine (PF) 2 mg/mL (0.2%) and diazepam.  See the medical record for exact dosing, route, and time of administration.  Follow-up plan:   Return for Keep sch. appt.       Recent Visits Date Type Provider Dept  02/27/24 Office Visit Cephus Collin, MD [facility]  Showing recent visits within past 90 days and meeting all other requirements Today's Visits Date Type Provider Dept  03/06/24 Procedure visit Cephus Collin, MD [facility]  Showing today's visits and meeting all other requirements Future Appointments Date Type Provider Dept  05/21/24 Appointment Patel, Seema K, NP [facility]  Showing future appointments within next 90 days and meeting all other  requirements  Disposition: Discharge home  Discharge (Date  Time): 03/06/2024;   hrs.   Primary Care Physician: Rory Collard, MD Location: [facility] Note by:  Cephus Collin, MD (TTS technology used. I apologize for any typographical errors that were not detected and corrected.) Date: 03/06/2024; Time: 12:25 PM  Disclaimer:  Medicine is not an Visual merchandiser. The only guarantee in medicine is that nothing is guaranteed. It is important to note that the decision to proceed with this intervention was based on the information collected from the patient. The Data and conclusions were drawn from the patient's questionnaire, the interview, and the physical examination. Because the information was provided in large part by the patient, it cannot be guaranteed that it has not been purposely or unconsciously manipulated. Every effort has been made to obtain as much relevant data as possible for this evaluation. It is important to note that the conclusions that lead to this procedure are derived in large part from the available data. Always take into account that the treatment will also be dependent on availability of resources and existing treatment guidelines, considered by other Pain Management Practitioners as being common knowledge and practice, at the time of the intervention. For Medico-Legal purposes, it is also important to point out that variation in procedural techniques and pharmacological choices are the acceptable norm. The indications, contraindications, technique, and results of the above procedure should only be interpreted and judged by a Board-Certified Interventional Pain Specialist with extensive familiarity and expertise in the same exact procedure and technique.

## 2024-03-06 NOTE — Patient Instructions (Signed)

## 2024-03-06 NOTE — Progress Notes (Signed)
 Safety precautions to be maintained throughout the outpatient stay will include: orient to surroundings, keep bed in low position, maintain call bell within reach at all times, provide assistance with transfer out of bed and ambulation.

## 2024-03-07 ENCOUNTER — Telehealth: Payer: Self-pay

## 2024-03-07 NOTE — Telephone Encounter (Signed)
 No issues post-procedure.

## 2024-04-23 ENCOUNTER — Encounter: Payer: Self-pay | Admitting: Nurse Practitioner

## 2024-04-23 ENCOUNTER — Ambulatory Visit: Attending: Student in an Organized Health Care Education/Training Program | Admitting: Nurse Practitioner

## 2024-04-23 VITALS — BP 146/69 | HR 79 | Temp 99.4°F | Resp 16 | Ht <= 58 in | Wt 128.0 lb

## 2024-04-23 DIAGNOSIS — Z981 Arthrodesis status: Secondary | ICD-10-CM | POA: Diagnosis present

## 2024-04-23 DIAGNOSIS — G894 Chronic pain syndrome: Secondary | ICD-10-CM | POA: Diagnosis present

## 2024-04-23 DIAGNOSIS — M5481 Occipital neuralgia: Secondary | ICD-10-CM | POA: Diagnosis present

## 2024-04-23 DIAGNOSIS — M5416 Radiculopathy, lumbar region: Secondary | ICD-10-CM | POA: Diagnosis present

## 2024-04-23 DIAGNOSIS — M7918 Myalgia, other site: Secondary | ICD-10-CM | POA: Insufficient documentation

## 2024-04-23 DIAGNOSIS — M47812 Spondylosis without myelopathy or radiculopathy, cervical region: Secondary | ICD-10-CM | POA: Insufficient documentation

## 2024-04-23 DIAGNOSIS — M1712 Unilateral primary osteoarthritis, left knee: Secondary | ICD-10-CM | POA: Diagnosis present

## 2024-04-23 DIAGNOSIS — Z79891 Long term (current) use of opiate analgesic: Secondary | ICD-10-CM | POA: Insufficient documentation

## 2024-04-23 NOTE — Progress Notes (Signed)
 PROVIDER NOTE: Interpretation of information contained herein should be left to medically-trained personnel. Specific patient instructions are provided elsewhere under Patient Instructions section of medical record. This document was created in part using AI and STT-dictation technology, any transcriptional errors that may result from this process are unintentional.  Patient: Sandra Mcconnell  Service: E/M   PCP: Rory Collard, MD  DOB: [birth year]  DOS: 04/23/2024  Provider: Cherylin Corrigan, NP  MRN: [id number]  Delivery: Face-to-face  Specialty: Interventional Pain Management  Type: Established Patient  Setting: Ambulatory outpatient facility  Specialty designation: 09  Referring Prov.: Rory Collard, MD  Location: Outpatient office facility       History of present illness (HPI) [name], a 83 y.o. 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 [name]'s primary complain today is Neck Pain  Pertinent problems: Ms. Piazza has Cervical spinal cord compression (HCC); Anxiety; Depression; Restless leg syndrome; Myofascial pain syndrome; Lumbar radiculopathy; Cervical spondylolysis; and Chronic pain syndrome on the pertinent problem list.  Pain Assessment: Severity of Chronic pain is reported as a 10-Worst pain ever/10. Location: Neck Posterior, Right/radaites to right head and right ear and right jaw area. Onset: More than a month ago. Quality: Aching, Constant, Radiating. Timing: Constant. Modifying factor(s): Heat. Vitals:  height is 4' 7 (1.397 m) and weight is 128 lb (58.1 kg). Her temporal temperature is 99.4 F (37.4 C). Her blood pressure is 146/69 (abnormal) and her pulse is 79. Her respiration is 16 and oxygen saturation is 97%.  BMI: Estimated body mass index is 29.75 kg/m as calculated from the following:   Height as of this encounter: 4' 7 (1.397 m).   Weight as of this encounter: 128 lb (58.1 kg).  Last encounter: 02/27/2024 Last procedure:  03/06/2024  Reason for encounter: evaluation of worsening, or previously known (established) problem.  The patient presents with a chronic right-sided neck and shoulder pain.  This pattern may be suggest involvement of the cervical spine, particularly in the lower cervical levels, which often refer pain to the shoulder region.  Pharmacotherapy Assessment   Analgesic: Monitoring: [place] PMP: PDMP not reviewed this encounter.       Pharmacotherapy: No side-effects or adverse reactions reported. Compliance: No problems identified. Effectiveness: Clinically acceptable.  Huston Maiers, RN  04/23/2024  1:50 PM  Sign when Signing Visit Safety precautions to be maintained throughout the outpatient stay will include: orient to surroundings, keep bed in low position, maintain call bell within reach at all times, provide assistance with transfer out of bed and ambulation.      UDS:  Summary  Date Value Ref Range Status  08/03/2023 Note  Final    Comment:    ==================================================================== Compliance Drug Analysis, Ur ==================================================================== Test                             Result       Flag       Units  Drug Present and Declared for Prescription Verification   Oxymorphone                    48           EXPECTED   ng/mg creat   Noroxycodone                   718          EXPECTED   ng/mg  creat   Noroxymorphone                 326          EXPECTED   ng/mg creat    Oxymorphone, noroxycodone and noroxymorphone are expected    metabolites of oxycodone . Noroxymorphone is an expected metabolite    of oxymorphone. Sources of oxycodone  and/or oxymorphone include    scheduled prescription medications.    Buprenorphine                   7            EXPECTED   ng/mg creat   Norbuprenorphine               6            EXPECTED   ng/mg creat    Source of buprenorphine  is a scheduled prescription medication.     Norbuprenorphine is an expected metabolite of buprenorphine .    Gabapentin                      PRESENT      EXPECTED   Sertraline                      PRESENT      EXPECTED   Desmethylsertraline            PRESENT      EXPECTED    Desmethylsertraline is an expected metabolite of sertraline .    Acetaminophen                   PRESENT      EXPECTED  Drug Absent but Declared for Prescription Verification   Oxycodone                       Not Detected UNEXPECTED ng/mg creat    Oxycodone  is almost always present in patients taking this drug    consistently.  Absence of oxycodone  could be due to lapse of time    since the last dose or unusual pharmacokinetics (rapid metabolism).    Tizanidine                      Not Detected UNEXPECTED    Tizanidine , as indicated in the declared medication list, is not    always detected even when used as directed.    Quetiapine                      Not Detected UNEXPECTED   Diclofenac                     Not Detected UNEXPECTED    Diclofenac, as indicated in the declared medication list, is not    always detected even when used as directed.    Salicylate                     Not Detected UNEXPECTED    Aspirin, as indicated in the declared medication list, is not always    detected even when used as directed.    Lidocaine                       Not Detected UNEXPECTED    Lidocaine , as indicated in the declared medication list, is not    always detected even when used as directed.  ==================================================================== Test  Result    Flag   Units      Ref Range   Creatinine              169              mg/dL      >=40 ==================================================================== Declared Medications:  The flagging and interpretation on this report are based on the  following declared medications.  Unexpected results may arise from  inaccuracies in the declared medications.   **Note: The testing  scope of this panel includes these medications:   Gabapentin  (Neurontin )  Oxycodone   Quetiapine  (Seroquel )  Sertraline  (Zoloft )   **Note: The testing scope of this panel does not include small to  moderate amounts of these reported medications:   Acetaminophen  (Tylenol )  Aspirin  Buprenorphine  Patch (BuTrans )  Diclofenac  Lidocaine  (Xylocaine )  Tizanidine    **Note: The testing scope of this panel does not include the  following reported medications:   Brimonidine (Alphagan)  Calcium  Dorzolamide (Trusopt)  Eye Drops  Fluticasone   Glucosamine  Ketorolac (Toradol)  Melatonin  Multivitamin  Omeprazole (Prilosec)  Ondansetron  (Zofran )  Prazosin  (Minipress )  Probiotic  Raloxifene (Evista)  Ropinirole  (Requip )  Simethicone   Topical ==================================================================== For clinical consultation, please call [phone]. ====================================================================     No results found for: CBDTHCR No results found for: D8THCCBX No results found for: D9THCCBX  ROS  Constitutional: Denies any fever or chills Gastrointestinal: No reported hemesis, hematochezia, vomiting, or acute GI distress Musculoskeletal: Neck pain (right side), shoulder pain (right) Neurological: No reported episodes of acute onset apraxia, aphasia, dysarthria, agnosia, amnesia, paralysis, loss of coordination, or loss of consciousness  Medication Review  Calcium 600+D3 Plus Minerals, Ginger Root, Glucosamine Sulfate, Multiple Vitamins, Simethicone , acetaminophen , aspirin EC, brimonidine, buprenorphine , cyclopentolate , dorzolamide, fluticasone , gabapentin , ketorolac, melatonin, omeprazole, ondansetron , oxyCODONE , prazosin , rOPINIRole , raloxifene, sertraline , and tiZANidine   History Review  Allergy: Sandra Mcconnell is allergic to aripiprazole, ciprofloxacin, mirtazapine, and penicillins. Drug: Sandra Mcconnell  reports no history of drug  use. Alcohol:  reports no history of alcohol use. Tobacco:  reports that she has never smoked. She has never used smokeless tobacco. Social: Sandra Mcconnell  reports that she has never smoked. She has never used smokeless tobacco. She reports that she does not drink alcohol and does not use drugs. Medical:  has a past medical history of Anemia (08/04/2017), Anxiety, Carotid artery disease (HCC), Carpal tunnel syndrome, Cataract cortical, senile, Cervical spinal cord compression (HCC), Chicken pox, Colitis, Complication of anesthesia, Depression, Dizziness, DJD (degenerative joint disease), Dysphagia, GERD (gastroesophageal reflux disease), Hallux rigidus, Hemorrhoids, History of hiatal hernia, History of palpitations, HLD (hyperlipidemia), Hypertension, IBS (irritable bowel syndrome), Iritis, Lactose intolerance, Migraines, Neuropathy, OCD (obsessive compulsive disorder), OSA on CPAP, Osteoarthritis, Osteoporosis, Panic attacks, PONV (postoperative nausea and vomiting), Psoriasis, PTSD (post-traumatic stress disorder), Redundant colon, REM sleep behavior disorder, Rosacea, Slow transit constipation, Spondylosis, and Stroke (HCC) (08/2000). Surgical: Ms. Frediani  has a past surgical history that includes Hand surgery; Spine surgery; Tonsillectomy; Abdominal hysterectomy; Back surgery; Foot Fusion (Bilateral); Eye surgery; TORUS PALATINUS (2007); Cataract extraction w/PHACO (Right, 12/08/2015); Colonoscopy; Esophagogastroduodenoscopy; Esophagogastroduodenoscopy (N/A, 11/15/2017); Colonoscopy with propofol  (N/A, 11/15/2017); Intramedullary (im) nail intertrochanteric (Left, 11/04/2020); and Hardware Removal (Left, 04/29/2021). Family: family history includes Breast cancer in her mother.  Laboratory Chemistry Profile   Renal Lab Results  Component Value Date   BUN 16 02/18/2024   CREATININE 0.71 02/18/2024   GFRAA >60 09/06/2015   GFRNONAA >60 02/18/2024    Hepatic Lab Results  Component Value  Date   AST 21  02/18/2024   ALT 16 02/18/2024   ALBUMIN 3.9 02/18/2024   ALKPHOS 68 02/18/2024   LIPASE 29 02/18/2024    Electrolytes Lab Results  Component Value Date   NA 138 02/18/2024   K 3.6 02/18/2024   CL 104 02/18/2024   CALCIUM 9.1 02/18/2024   MG 1.9 11/12/2020    Bone No results found for: VD25OH, VD125OH2TOT, EA5409WJ1, BJ4782NF6, 25OHVITD1, 25OHVITD2, 25OHVITD3, TESTOFREE, TESTOSTERONE  Inflammation (CRP: Acute Phase) (ESR: Chronic Phase) No results found for: CRP, ESRSEDRATE, LATICACIDVEN       Note: Above Lab results reviewed.  Recent Imaging Review  CT L-SPINE NO CHARGE CLINICAL DATA:  Chronic pain with vague abdominal pain in tenderness  EXAM: CT LUMBAR SPINE WITHOUT CONTRAST  TECHNIQUE: Multidetector CT imaging of the lumbar spine was performed without intravenous contrast administration. Multiplanar CT image reconstructions were also generated.  RADIATION DOSE REDUCTION: This exam was performed according to the departmental dose-optimization program which includes automated exposure control, adjustment of the mA and/or kV according to patient size and/or use of iterative reconstruction technique.  COMPARISON:  None Available.  FINDINGS: Segmentation: 5 lumbar type vertebrae  Alignment: Mild degenerative anterolisthesis at L3-4 and L4-5. Mild scoliosis  Vertebrae: Remote L1 superior endplate fracture with moderate height loss. Subjective generalized osteopenia. No acute fracture  Paraspinal and other soft tissues: No perispinal inflammation or masslike finding seen  Disc levels: Generalized disc narrowing and endplate degeneration, disc collapse greatest at T10-11, T11-12, and L4-5. Diffuse degenerative facet spurring as well. Mild to moderate spinal canal narrowing at L2-3 to L4-5. Notable foraminal impingement on the right at L4-5. Moderate left foraminal narrowing at L1-2 and L2-3.  IMPRESSION: No acute finding.  Remote L1  compression fracture.  Advanced lumbar spine degeneration with scoliosis and listhesis as described.  Electronically Signed   By: Ronnette Coke M.D.   On: 02/18/2024 08:50 CT ABDOMEN PELVIS W CONTRAST CLINICAL DATA:  Abdominal pain, acute in nonlocalized.  EXAM: CT ABDOMEN AND PELVIS WITH CONTRAST  TECHNIQUE: Multidetector CT imaging of the abdomen and pelvis was performed using the standard protocol following bolus administration of intravenous contrast.  RADIATION DOSE REDUCTION: This exam was performed according to the departmental dose-optimization program which includes automated exposure control, adjustment of the mA and/or kV according to patient size and/or use of iterative reconstruction technique.  CONTRAST:  75mL OMNIPAQUE  IOHEXOL  300 MG/ML  SOLN  COMPARISON:  09/06/2015  FINDINGS: Lower chest:  No contributory findings.  Hepatobiliary: Subcutaneous mass in the right lobe liver measuring 16 mm, contracted from before when there was more typical hemangioma features, interval involution/sclerosis. Accentuated vessels in segment 4B likely from shunt. No acute or interval finding.  Pancreas: Assessment is limited by motion. No acute or aggressive finding.  Spleen: Unremarkable.  Adrenals/Urinary Tract: Negative adrenals. No hydronephrosis or stone. Cystic lesion in the interpolar right kidney measuring 12 mm on the delayed phase, some internal septation or peripheral enhancement is present but stable if not progressed size since 2016, non worrisome especially for patient age. Unremarkable bladder.  Stomach/Bowel: Tortuous colon with extensive stool retention but no obstruction or over distention. No bowel inflammation seen throughout the abdomen. No visible appendix.  Vascular/Lymphatic: No acute vascular abnormality. Diffuse atheromatous calcification no mass or adenopathy.  Reproductive:No pathologic findings.  Hysterectomy.  Other: No ascites or  pneumoperitoneum.  Musculoskeletal: No acute abnormalities. Remote L1 compression fracture. Advanced lumbar spine degeneration. Prior left proximal femur nail fixation and cerclage wire.  IMPRESSION: No acute finding. Chronic findings are stable or regressed from 2016 and noted above.  Electronically Signed   By: Ronnette Coke M.D.   On: 02/18/2024 08:25 Note: Reviewed        Physical Exam  General appearance: Well nourished, well developed, and well hydrated. In no apparent acute distress Mental status: Alert, oriented x 3 (person, place, & time)       Respiratory: No evidence of acute respiratory distress Eyes: PERLA Vitals: BP (!) 146/69 (Patient Position: Sitting, Cuff Size: Normal)   Pulse 79   Temp 99.4 F (37.4 C) (Temporal)   Resp 16   Ht 4' 7 (1.397 m)   Wt 128 lb (58.1 kg)   SpO2 97%   BMI 29.75 kg/m  BMI: Estimated body mass index is 29.75 kg/m as calculated from the following:   Height as of this encounter: 4' 7 (1.397 m).   Weight as of this encounter: 128 lb (58.1 kg). Ideal: Patient must be at least 60 in tall to calculate ideal body weight  Musculoskeletal: Restricted range of motion right side of the neck Assessment   Diagnosis Status  1. Myofascial pain syndrome   2. Chronic pain syndrome   3. Bilateral occipital neuralgia   4. Arthritis of left knee   5. Lumbar radiculopathy   6. Cervical spondylosis   7. Encounter for long-term opiate analgesic use   8. S/P cervical spinal fusion (C2-C6)    Persistent Controlled Controlled   Updated Problems: Problem  Myofascial Pain Syndrome    Plan of Care  Problem-specific:  Assessment and Plan  Myofascial pain syndrome: As per discussion with Sandra Mcconnell and her husband, due to chronic right sided neck and right shoulder pain, we reviewed her symptoms in detail.  Based on clinical assessment and her reported pain pattern, we recommended trigger point injections to address myofascial pain  contributing to her discomfort.   Sandra Mcconnell has a current medication list which includes the following long-term medication(s): calcium 600+d3 plus minerals, gabapentin , omeprazole, prazosin , prazosin , ropinirole , sertraline , and simethicone .  Pharmacotherapy (Medications Ordered): No orders of the defined types were placed in this encounter.  Orders:  Orders Placed This Encounter  Procedures   TRIGGER POINT INJECTION    Standing Status:   Future    Expiration Date:   07/24/2024    Scheduling Instructions:     Area: Neck     Side: Right     Sedation: Patient's choice.     Timeframe: ASAA    Where will this procedure be performed?:   [facility]        No follow-ups on file.    Recent Visits Date Type Provider Dept  03/06/24 Procedure visit Cephus Collin, MD [facility]  02/27/24 Office Visit Cephus Collin, MD [facility]  Showing recent visits within past 90 days and meeting all other requirements Today's Visits Date Type Provider Dept  04/23/24 Office Visit Jayvin Hurrell K, NP [facility]  Showing today's visits and meeting all other requirements Future Appointments Date Type Provider Dept  05/21/24 Appointment Mollee Neer K, NP [facility]  Showing future appointments within next 90 days and meeting all other requirements  I discussed the assessment and treatment plan with the patient. The patient was provided an opportunity to ask questions and all were answered. The patient agreed with the plan and demonstrated an understanding of the instructions.  Patient advised to call back or seek an in-person  evaluation if the symptoms or condition worsens.  Duration of encounter: 30 minutes.  Total time on encounter, as per AMA guidelines included both the face-to-face and non-face-to-face time personally spent by the physician and/or other qualified health care professional(s) on the day of the encounter (includes time in  activities that require the physician or other qualified health care professional and does not include time in activities normally performed by clinical staff). Physician's time may include the following activities when performed: Preparing to see the patient (e.g., pre-charting review of records, searching for previously ordered imaging, lab work, and nerve conduction tests) Review of prior analgesic pharmacotherapies. Reviewing PMP Interpreting ordered tests (e.g., lab work, imaging, nerve conduction tests) Performing post-procedure evaluations, including interpretation of diagnostic procedures Obtaining and/or reviewing separately obtained history Performing a medically appropriate examination and/or evaluation Counseling and educating the patient/family/caregiver Ordering medications, tests, or procedures Referring and communicating with other health care professionals (when not separately reported) Documenting clinical information in the electronic or other health record Independently interpreting results (not separately reported) and communicating results to the patient/ family/caregiver Care coordination (not separately reported)  Note by: Yanixan Mellinger K Mckinnley Cottier, NP (TTS and AI technology used. I apologize for any typographical errors that were not detected and corrected.) Date: 04/23/2024; Time: 2:44 PM

## 2024-04-23 NOTE — Progress Notes (Signed)
 Safety precautions to be maintained throughout the outpatient stay will include: orient to surroundings, keep bed in low position, maintain call bell within reach at all times, provide assistance with transfer out of bed and ambulation.

## 2024-04-24 ENCOUNTER — Other Ambulatory Visit: Payer: Self-pay | Admitting: Nurse Practitioner

## 2024-05-21 ENCOUNTER — Ambulatory Visit: Attending: Nurse Practitioner | Admitting: Nurse Practitioner

## 2024-05-21 ENCOUNTER — Encounter: Payer: Self-pay | Admitting: Nurse Practitioner

## 2024-05-21 VITALS — BP 114/54 | HR 81 | Temp 98.0°F | Resp 14 | Ht <= 58 in | Wt 127.0 lb

## 2024-05-21 DIAGNOSIS — M533 Sacrococcygeal disorders, not elsewhere classified: Secondary | ICD-10-CM | POA: Insufficient documentation

## 2024-05-21 DIAGNOSIS — G894 Chronic pain syndrome: Secondary | ICD-10-CM | POA: Insufficient documentation

## 2024-05-21 DIAGNOSIS — M7918 Myalgia, other site: Secondary | ICD-10-CM | POA: Insufficient documentation

## 2024-05-21 DIAGNOSIS — M5481 Occipital neuralgia: Secondary | ICD-10-CM | POA: Insufficient documentation

## 2024-05-21 DIAGNOSIS — M47812 Spondylosis without myelopathy or radiculopathy, cervical region: Secondary | ICD-10-CM | POA: Diagnosis present

## 2024-05-21 DIAGNOSIS — M1712 Unilateral primary osteoarthritis, left knee: Secondary | ICD-10-CM | POA: Diagnosis not present

## 2024-05-21 DIAGNOSIS — G8929 Other chronic pain: Secondary | ICD-10-CM | POA: Insufficient documentation

## 2024-05-21 DIAGNOSIS — Z79891 Long term (current) use of opiate analgesic: Secondary | ICD-10-CM | POA: Insufficient documentation

## 2024-05-21 DIAGNOSIS — M5416 Radiculopathy, lumbar region: Secondary | ICD-10-CM | POA: Diagnosis present

## 2024-05-21 MED ORDER — OXYCODONE HCL 5 MG PO TABS
5.0000 mg | ORAL_TABLET | Freq: Two times a day (BID) | ORAL | 0 refills | Status: DC | PRN
Start: 2024-07-26 — End: 2024-08-13

## 2024-05-21 MED ORDER — BUPRENORPHINE 15 MCG/HR TD PTWK: 1.0000 | MEDICATED_PATCH | TRANSDERMAL | 2 refills | Status: DC

## 2024-05-21 MED ORDER — OXYCODONE HCL 5 MG PO TABS: 5.0000 mg | ORAL_TABLET | Freq: Two times a day (BID) | ORAL | 0 refills | Status: AC | PRN

## 2024-05-21 NOTE — Progress Notes (Signed)
 Nursing Pain Medication Assessment:  Safety precautions to be maintained throughout the outpatient stay will include: orient to surroundings, keep bed in low position, maintain call bell within reach at all times, provide assistance with transfer out of bed and ambulation.  Medication Inspection Compliance: Pill count conducted under aseptic conditions, in front of the patient. Neither the pills nor the bottle was removed from the patient's sight at any time. Once count was completed pills were immediately returned to the patient in their original bottle.  Medication #1: Oxycodone  IR Pill/Patch Count: 10 of 60 pills/patches remain Pill/Patch Appearance: Markings consistent with prescribed medication Bottle Appearance: Standard pharmacy container. Clearly labeled. Filled Date: 06 / 21 / 2025 Last Medication intake:  Yesterday  Medication #2: Buprenorphine  (Suboxone) Pill/Patch Count: 0 of 4 pills/patches remain Pill/Patch Appearance: Markings consistent with prescribed medication Bottle Appearance: Standard pharmacy container. Clearly labeled. Filled Date: 04 / 22 / 2025 Last Medication intake:  TodaySafety precautions to be maintained throughout the outpatient stay will include: orient to surroundings, keep bed in low position, maintain call bell within reach at all times, provide assistance with transfer out of bed and ambulation.

## 2024-05-21 NOTE — Progress Notes (Signed)
 PROVIDER NOTE: Interpretation of information contained herein should be left to medically-trained personnel. Specific patient instructions are provided elsewhere under Patient Instructions section of medical record. This document was created in part using AI and STT-dictation technology, any transcriptional errors that may result from this process are unintentional.  Patient: Sandra Mcconnell  Service: E/M   PCP: Glover Lenis, MD  DOB: Jan 26, 1941  DOS: 05/21/2024  Provider: Emmy MARLA Blanch, NP  MRN: [id number]  Delivery: Face-to-face  Specialty: Interventional Pain Management  Type: Established Patient  Setting: Ambulatory outpatient facility  Specialty designation: 09  Referring Prov.: Glover Lenis, MD  Location: Outpatient office facility       History of present illness (HPI) Sandra Mcconnell, a 83 y.o. year old female, is here today because of her Chronic pain syndrome [G89.4]. Sandra Mcconnell's primary complain today is Headache  Pertinent problems: Sandra Mcconnell  has Cervical spinal cord compression (HCC); Anxiety; Depression; Restless leg syndrome; Myofascial pain syndrome; Lumbar radiculopathy; Cervical spondylolysis; and Chronic pain syndrome on the pertinent problem list.   Pain Assessment: Severity of Chronic pain is reported as a 4 /10. Location: Head  / . Onset: More than a month ago. Quality: Aching, Constant. Timing: Constant. Modifying factor(s): heat. Vitals:  height is 4' 7 (1.397 m) and weight is 127 lb (57.6 kg). Her temporal temperature is 98 F (36.7 C). Her blood pressure is 114/54 (abnormal) and her pulse is 81. Her respiration is 14 and oxygen saturation is 99%.  BMI: Estimated body mass index is 29.52 kg/m as calculated from the following:   Height as of this encounter: 4' 7 (1.397 m).   Weight as of this encounter: 127 lb (57.6 kg).  Last encounter: 04/23/2024. Last procedure: 03/06/2024  Reason for encounter: both, medication management and post-procedure evaluation and  assessment. No change in medical history since last visit.  Patient's pain is at baseline.  Patient continues multimodal pain regimen as prescribed.  States that it provides pain relief and improvement in functional status.   Sandra Mcconnell underwent a diagnostic greater occipital nerve block on March 06, 2024.  She reports initially 80% pain relief and functional improvement during the local anesthetic phase, followed by sustained 50% pain relief functionally improvement with pain score 4/10 since the procedure.  Procedure Procedure:           Anesthesia, Analgesia, Anxiolysis:  Type: Diagnostic, Greater, Occipital Nerve Block  #1  Region: Posterolateral Cervical Level: Occipital Ridge   Laterality: Bilateral   Anesthesia: Local (1-2% Lidocaine )  Anxiolysis: Oral Valium   Sedation: None        Position: Prone    1. Bilateral occipital neuralgia   2. Chronic pain syndrome     NAS-11 Pain score:        Pre-procedure: 9 /10        Post-procedure: 4 /10   Post-Procedure Evaluation   Effectiveness:  Initial hour after procedure: 80 % . Subsequent 4-6 hours post-procedure: 50 % . Analgesia past initial 6 hours: 50 % . Ongoing improvement:  Analgesic:  Sandra Mcconnell underwent a diagnostic greater occipital nerve block on March 06, 2024.  She reports initially 80% pain relief and functional improvement during the local anesthetic phase, followed by sustained 50% pain relief functionally improvement with pain score 4/10 since the procedure. Function: Sandra Mcconnell reports improvement in function ROM: Sandra Mcconnell reports improvement in ROM  Pharmacotherapy Assessment   Analgesic: Butrans  15 mcg/h patch, 1 patch onto the skin once a week. Oxycodone  (  Oxy IR/Roxicodone ) 5 mg immediate release tablet 2 times daily as needed as needed for severe pain. MME=15 Monitoring: [place] PMP: PDMP reviewed during this encounter.       Pharmacotherapy: No side-effects or adverse reactions reported. Compliance: No problems  identified. Effectiveness: Clinically acceptable.  Sandra Reda CROME, RN  05/21/2024 10:15 AM  Sign when Signing Visit Nursing Pain Medication Assessment:  Safety precautions to be maintained throughout the outpatient stay will include: orient to surroundings, keep bed in low position, maintain call bell within reach at all times, provide assistance with transfer out of bed and ambulation.  Medication Inspection Compliance: Pill count conducted under aseptic conditions, in front of the patient. Neither the pills nor the bottle was removed from the patient's sight at any time. Once count was completed pills were immediately returned to the patient in their original bottle.  Medication #1: Oxycodone  IR Pill/Patch Count: 10 of 60 pills/patches remain Pill/Patch Appearance: Markings consistent with prescribed medication Bottle Appearance: Standard pharmacy container. Clearly labeled. Filled Date: 06 / 21 / 2025 Last Medication intake:  Yesterday  Medication #2: Buprenorphine  (Suboxone) Pill/Patch Count: 0 of 4 pills/patches remain Pill/Patch Appearance: Markings consistent with prescribed medication Bottle Appearance: Standard pharmacy container. Clearly labeled. Filled Date: 04 / 22 / 2025 Last Medication intake:  TodaySafety precautions to be maintained throughout the outpatient stay will include: orient to surroundings, keep bed in low position, maintain call bell within reach at all times, provide assistance with transfer out of bed and ambulation.     UDS:  Summary  Date Value Ref Range Status  08/03/2023 Note  Final    Comment:    ==================================================================== Compliance Drug Analysis, Ur ==================================================================== Test                             Result       Flag       Units  Drug Present and Declared for Prescription Verification   Oxymorphone                    48           EXPECTED   ng/mg creat    Noroxycodone                   718          EXPECTED   ng/mg creat   Noroxymorphone                 326          EXPECTED   ng/mg creat    Oxymorphone, noroxycodone and noroxymorphone are expected    metabolites of oxycodone . Noroxymorphone is an expected metabolite    of oxymorphone. Sources of oxycodone  and/or oxymorphone include    scheduled prescription medications.    Buprenorphine                   7            EXPECTED   ng/mg creat   Norbuprenorphine               6            EXPECTED   ng/mg creat    Source of buprenorphine  is a scheduled prescription medication.    Norbuprenorphine is an expected metabolite of buprenorphine .    Gabapentin   PRESENT      EXPECTED   Sertraline                      PRESENT      EXPECTED   Desmethylsertraline            PRESENT      EXPECTED    Desmethylsertraline is an expected metabolite of sertraline .    Acetaminophen                   PRESENT      EXPECTED  Drug Absent but Declared for Prescription Verification   Oxycodone                       Not Detected UNEXPECTED ng/mg creat    Oxycodone  is almost always present in patients taking this drug    consistently.  Absence of oxycodone  could be due to lapse of time    since the last dose or unusual pharmacokinetics (rapid metabolism).    Tizanidine                      Not Detected UNEXPECTED    Tizanidine , as indicated in the declared medication list, is not    always detected even when used as directed.    Quetiapine                      Not Detected UNEXPECTED   Diclofenac                     Not Detected UNEXPECTED    Diclofenac, as indicated in the declared medication list, is not    always detected even when used as directed.    Salicylate                     Not Detected UNEXPECTED    Aspirin, as indicated in the declared medication list, is not always    detected even when used as directed.    Lidocaine                       Not Detected UNEXPECTED    Lidocaine , as  indicated in the declared medication list, is not    always detected even when used as directed.  ==================================================================== Test                      Result    Flag   Units      Ref Range   Creatinine              169              mg/dL      >=79 ==================================================================== Declared Medications:  The flagging and interpretation on this report are based on the  following declared medications.  Unexpected results may arise from  inaccuracies in the declared medications.   **Note: The testing scope of this panel includes these medications:   Gabapentin  (Neurontin )  Oxycodone   Quetiapine  (Seroquel )  Sertraline  (Zoloft )   **Note: The testing scope of this panel does not include small to  moderate amounts of these reported medications:   Acetaminophen  (Tylenol )  Aspirin  Buprenorphine  Patch (BuTrans )  Diclofenac  Lidocaine  (Xylocaine )  Tizanidine    **Note: The testing scope of this panel does not include the  following reported medications:   Brimonidine (Alphagan)  Calcium  Dorzolamide (Trusopt)  Eye Drops  Fluticasone   Glucosamine  Ketorolac (Toradol)  Melatonin  Multivitamin  Omeprazole (Prilosec)  Ondansetron  (Zofran )  Prazosin  (Minipress )  Probiotic  Raloxifene (Evista)  Ropinirole  (Requip )  Simethicone   Topical ==================================================================== For clinical consultation, please call [phone]. ====================================================================     No results found for: CBDTHCR No results found for: D8THCCBX No results found for: D9THCCBX  ROS  Constitutional: Denies any fever or chills Gastrointestinal: No reported hemesis, hematochezia, vomiting, or acute GI distress Musculoskeletal: Denies any acute onset joint swelling, redness, loss of ROM, or weakness Neurological: No reported episodes of acute onset  apraxia, aphasia, dysarthria, agnosia, amnesia, paralysis, loss of coordination, or loss of consciousness  Medication Review  Calcium 600+D3 Plus Minerals, Ginger Root, Glucosamine Sulfate, Multiple Vitamins, Simethicone , acetaminophen , aspirin EC, brimonidine, buprenorphine , cyclopentolate , dorzolamide, fluticasone , gabapentin , ketorolac, melatonin, omeprazole, ondansetron , oxyCODONE , prazosin , rOPINIRole , raloxifene, sertraline , and tiZANidine   History Review  Allergy: Ms. Krichbaum is allergic to aripiprazole, ciprofloxacin, mirtazapine, and penicillins. Drug: Ms. Takayama  reports no history of drug use. Alcohol:  reports no history of alcohol use. Tobacco:  reports that she has never smoked. She has never used smokeless tobacco. Social: Ms. Buerger  reports that she has never smoked. She has never used smokeless tobacco. She reports that she does not drink alcohol and does not use drugs. Medical:  has a past medical history of Anemia (08/04/2017), Anxiety, Carotid artery disease (HCC), Carpal tunnel syndrome, Cataract cortical, senile, Cervical spinal cord compression (HCC), Chicken pox, Colitis, Complication of anesthesia, Depression, Dizziness, DJD (degenerative joint disease), Dysphagia, GERD (gastroesophageal reflux disease), Hallux rigidus, Hemorrhoids, History of hiatal hernia, History of palpitations, HLD (hyperlipidemia), Hypertension, IBS (irritable bowel syndrome), Iritis, Lactose intolerance, Migraines, Neuropathy, OCD (obsessive compulsive disorder), OSA on CPAP, Osteoarthritis, Osteoporosis, Panic attacks, PONV (postoperative nausea and vomiting), Psoriasis, PTSD (post-traumatic stress disorder), Redundant colon, REM sleep behavior disorder, Rosacea, Slow transit constipation, Spondylosis, and Stroke (HCC) (08/2000). Surgical: Ms. Eskelson  has a past surgical history that includes Hand surgery; Spine surgery; Tonsillectomy; Abdominal hysterectomy; Back surgery; Foot Fusion (Bilateral); Eye surgery;  TORUS PALATINUS (2007); Cataract extraction w/PHACO (Right, 12/08/2015); Colonoscopy; Esophagogastroduodenoscopy; Esophagogastroduodenoscopy (N/A, 11/15/2017); Colonoscopy with propofol  (N/A, 11/15/2017); Intramedullary (im) nail intertrochanteric (Left, 11/04/2020); and Hardware Removal (Left, 04/29/2021). Family: family history includes Breast cancer in her mother.  Laboratory Chemistry Profile   Renal Lab Results  Component Value Date   BUN 16 02/18/2024   CREATININE 0.71 02/18/2024   GFRAA >60 09/06/2015   GFRNONAA >60 02/18/2024    Hepatic Lab Results  Component Value Date   AST 21 02/18/2024   ALT 16 02/18/2024   ALBUMIN 3.9 02/18/2024   ALKPHOS 68 02/18/2024   LIPASE 29 02/18/2024    Electrolytes Lab Results  Component Value Date   NA 138 02/18/2024   K 3.6 02/18/2024   CL 104 02/18/2024   CALCIUM 9.1 02/18/2024   MG 1.9 11/12/2020    Bone No results found for: VD25OH, VD125OH2TOT, CI6874NY7, CI7874NY7, 25OHVITD1, 25OHVITD2, 25OHVITD3, TESTOFREE, TESTOSTERONE  Inflammation (CRP: Acute Phase) (ESR: Chronic Phase) No results found for: CRP, ESRSEDRATE, LATICACIDVEN       Note: Above Lab results reviewed.  Recent Imaging Review  CT L-SPINE NO CHARGE CLINICAL DATA:  Chronic pain with vague abdominal pain in tenderness  EXAM: CT LUMBAR SPINE WITHOUT CONTRAST  TECHNIQUE: Multidetector CT imaging of the lumbar spine was performed without intravenous contrast administration. Multiplanar CT image reconstructions were also generated.  RADIATION DOSE REDUCTION: This exam was performed according to the departmental dose-optimization program which includes automated  exposure control, adjustment of the mA and/or kV according to patient size and/or use of iterative reconstruction technique.  COMPARISON:  None Available.  FINDINGS: Segmentation: 5 lumbar type vertebrae  Alignment: Mild degenerative anterolisthesis at L3-4 and L4-5.  Mild scoliosis  Vertebrae: Remote L1 superior endplate fracture with moderate height loss. Subjective generalized osteopenia. No acute fracture  Paraspinal and other soft tissues: No perispinal inflammation or masslike finding seen  Disc levels: Generalized disc narrowing and endplate degeneration, disc collapse greatest at T10-11, T11-12, and L4-5. Diffuse degenerative facet spurring as well. Mild to moderate spinal canal narrowing at L2-3 to L4-5. Notable foraminal impingement on the right at L4-5. Moderate left foraminal narrowing at L1-2 and L2-3.  IMPRESSION: No acute finding.  Remote L1 compression fracture.  Advanced lumbar spine degeneration with scoliosis and listhesis as described.  Electronically Signed   By: Dorn Roulette M.D.   On: 02/18/2024 08:50 CT ABDOMEN PELVIS W CONTRAST CLINICAL DATA:  Abdominal pain, acute in nonlocalized.  EXAM: CT ABDOMEN AND PELVIS WITH CONTRAST  TECHNIQUE: Multidetector CT imaging of the abdomen and pelvis was performed using the standard protocol following bolus administration of intravenous contrast.  RADIATION DOSE REDUCTION: This exam was performed according to the departmental dose-optimization program which includes automated exposure control, adjustment of the mA and/or kV according to patient size and/or use of iterative reconstruction technique.  CONTRAST:  75mL OMNIPAQUE  IOHEXOL  300 MG/ML  SOLN  COMPARISON:  09/06/2015  FINDINGS: Lower chest:  No contributory findings.  Hepatobiliary: Subcutaneous mass in the right lobe liver measuring 16 mm, contracted from before when there was more typical hemangioma features, interval involution/sclerosis. Accentuated vessels in segment 4B likely from shunt. No acute or interval finding.  Pancreas: Assessment is limited by motion. No acute or aggressive finding.  Spleen: Unremarkable.  Adrenals/Urinary Tract: Negative adrenals. No hydronephrosis or stone. Cystic lesion  in the interpolar right kidney measuring 12 mm on the delayed phase, some internal septation or peripheral enhancement is present but stable if not progressed size since 2016, non worrisome especially for patient age. Unremarkable bladder.  Stomach/Bowel: Tortuous colon with extensive stool retention but no obstruction or over distention. No bowel inflammation seen throughout the abdomen. No visible appendix.  Vascular/Lymphatic: No acute vascular abnormality. Diffuse atheromatous calcification no mass or adenopathy.  Reproductive:No pathologic findings.  Hysterectomy.  Other: No ascites or pneumoperitoneum.  Musculoskeletal: No acute abnormalities. Remote L1 compression fracture. Advanced lumbar spine degeneration. Prior left proximal femur nail fixation and cerclage wire.  IMPRESSION: No acute finding. Chronic findings are stable or regressed from 2016 and noted above.  Electronically Signed   By: Dorn Roulette M.D.   On: 02/18/2024 08:25 Note: Reviewed        Physical Exam  Vitals: BP (!) 114/54 (Cuff Size: Normal)   Pulse 81   Temp 98 F (36.7 C) (Temporal)   Resp 14   Ht 4' 7 (1.397 m)   Wt 127 lb (57.6 kg)   SpO2 99%   BMI 29.52 kg/m  BMI: Estimated body mass index is 29.52 kg/m as calculated from the following:   Height as of this encounter: 4' 7 (1.397 m).   Weight as of this encounter: 127 lb (57.6 kg). Ideal: Patient must be at least 60 in tall to calculate ideal body weight General appearance: Well nourished, well developed, and well hydrated. In no apparent acute distress Mental status: Alert, oriented x 3 (person, place, & time)       Respiratory: No evidence  of acute respiratory distress Eyes: PERLA   Assessment   Diagnosis Status  1. Chronic pain syndrome   2. Myofascial pain syndrome   3. Bilateral occipital neuralgia   4. Arthritis of left knee   5. Lumbar radiculopathy   6. Cervical spondylosis   7. Encounter for long-term opiate  analgesic use   8. Chronic left SI joint pain    Controlled Controlled Controlled   Updated Problems: No problems updated.  Plan of Care  Problem-specific:  Assessment and Plan Will continue on current medication regimen.  Prescribing drug monitoring (PDMP) reviewed; findings consistent with the use of prescribed medication and no evidence of narcotic misuse or abuse.  Urine drug screening (UDS) up-to-date.  No other new issues or problems reported to this visit.  Schedule follow-up in 90 days for medication management.   Ms. MADISSON KULAGA has a current medication list which includes the following long-term medication(s): calcium 600+d3 plus minerals, gabapentin , omeprazole, prazosin , prazosin , ropinirole , sertraline , and simethicone .  Pharmacotherapy (Medications Ordered): Meds ordered this encounter  Medications   buprenorphine  (BUTRANS ) 15 MCG/HR    Sig: Place 1 patch onto the skin once a week.    Dispense:  4 patch    Refill:  2    Chronic Pain: STOP Act (Not applicable) Fill 1 day early if closed on refill date. Avoid benzodiazepines within 8 hours of opioids   oxyCODONE  (OXY IR/ROXICODONE ) 5 MG immediate release tablet    Sig: Take 1 tablet (5 mg total) by mouth 2 (two) times daily as needed for severe pain (pain score 7-10). Must last 30 days.    Dispense:  60 tablet    Refill:  0    Chronic Pain: STOP Act (Not applicable) Fill 1 day early if closed on refill date. Avoid benzodiazepines within 8 hours of opioids   oxyCODONE  (OXY IR/ROXICODONE ) 5 MG immediate release tablet    Sig: Take 1 tablet (5 mg total) by mouth 2 (two) times daily as needed for severe pain (pain score 7-10). Must last 30 days.    Dispense:  60 tablet    Refill:  0    Chronic Pain: STOP Act (Not applicable) Fill 1 day early if closed on refill date. Avoid benzodiazepines within 8 hours of opioids   oxyCODONE  (OXY IR/ROXICODONE ) 5 MG immediate release tablet    Sig: Take 1 tablet (5 mg total) by mouth 2  (two) times daily as needed for severe pain (pain score 7-10). Must last 30 days.    Dispense:  60 tablet    Refill:  0    Chronic Pain: STOP Act (Not applicable) Fill 1 day early if closed on refill date. Avoid benzodiazepines within 8 hours of opioids   Orders:  No orders of the defined types were placed in this encounter.       Return in about 3 months (around 08/21/2024) for (F2F), (MM), Emmy Blanch NP.    Recent Visits Date Type Provider Dept  04/23/24 Office Visit Paitlyn Mcclatchey K, NP [facility]  03/06/24 Procedure visit Marcelino Nurse, MD [facility]  02/27/24 Office Visit Marcelino Nurse, MD [facility]  Showing recent visits within past 90 days and meeting all other requirements Today's Visits Date Type Provider Dept  05/21/24 Office Visit Shiasia Porro K, NP [facility]  Showing today's visits and meeting all other requirements Future Appointments Date Type Provider Dept  08/13/24 Appointment Chiara Coltrin K, NP [facility]  Showing future appointments  within next 90 days and meeting all other requirements  I discussed the assessment and treatment plan with the patient. The patient was provided an opportunity to ask questions and all were answered. The patient agreed with the plan and demonstrated an understanding of the instructions.  Patient advised to call back or seek an in-person evaluation if the symptoms or condition worsens.  Duration of encounter: 30 minutes.  Total time on encounter, as per AMA guidelines included both the face-to-face and non-face-to-face time personally spent by the physician and/or other qualified health care professional(s) on the day of the encounter (includes time in activities that require the physician or other qualified health care professional and does not include time in activities normally performed by clinical staff). Physician's time may include the following activities when  performed: Preparing to see the patient (e.g., pre-charting review of records, searching for previously ordered imaging, lab work, and nerve conduction tests) Review of prior analgesic pharmacotherapies. Reviewing PMP Interpreting ordered tests (e.g., lab work, imaging, nerve conduction tests) Performing post-procedure evaluations, including interpretation of diagnostic procedures Obtaining and/or reviewing separately obtained history Performing a medically appropriate examination and/or evaluation Counseling and educating the patient/family/caregiver Ordering medications, tests, or procedures Referring and communicating with other health care professionals (when not separately reported) Documenting clinical information in the electronic or other health record Independently interpreting results (not separately reported) and communicating results to the patient/ family/caregiver Care coordination (not separately reported)  Note by: Deitrich Steve K Lahoma Constantin, NP (TTS and AI technology used. I apologize for any typographical errors that were not detected and corrected.) Date: 05/21/2024; Time: 10:47 AM

## 2024-07-02 ENCOUNTER — Ambulatory Visit: Payer: Medicare PPO | Admitting: Dermatology

## 2024-07-02 DIAGNOSIS — D2272 Melanocytic nevi of left lower limb, including hip: Secondary | ICD-10-CM

## 2024-07-02 DIAGNOSIS — L578 Other skin changes due to chronic exposure to nonionizing radiation: Secondary | ICD-10-CM | POA: Diagnosis not present

## 2024-07-02 DIAGNOSIS — Z1283 Encounter for screening for malignant neoplasm of skin: Secondary | ICD-10-CM

## 2024-07-02 DIAGNOSIS — D229 Melanocytic nevi, unspecified: Secondary | ICD-10-CM

## 2024-07-02 DIAGNOSIS — D1801 Hemangioma of skin and subcutaneous tissue: Secondary | ICD-10-CM

## 2024-07-02 DIAGNOSIS — L814 Other melanin hyperpigmentation: Secondary | ICD-10-CM | POA: Diagnosis not present

## 2024-07-02 DIAGNOSIS — L729 Follicular cyst of the skin and subcutaneous tissue, unspecified: Secondary | ICD-10-CM

## 2024-07-02 DIAGNOSIS — L720 Epidermal cyst: Secondary | ICD-10-CM

## 2024-07-02 DIAGNOSIS — L821 Other seborrheic keratosis: Secondary | ICD-10-CM

## 2024-07-02 DIAGNOSIS — W908XXA Exposure to other nonionizing radiation, initial encounter: Secondary | ICD-10-CM | POA: Diagnosis not present

## 2024-07-02 NOTE — Patient Instructions (Signed)

## 2024-07-02 NOTE — Progress Notes (Signed)
   Follow-Up Visit   Subjective  Sandra Mcconnell is a 83 y.o. female who presents for the following: Skin Cancer Screening and Full Body Skin Exam  The patient presents for Total-Body Skin Exam (TBSE) for skin cancer screening and mole check. The patient has spots, moles and lesions to be evaluated, some may be new or changing. She has rosacea, but hasn't had to use metronidazole  cream very often. No history of skin cancer.    The following portions of the chart were reviewed this encounter and updated as appropriate: medications, allergies, medical history  Review of Systems:  No other skin or systemic complaints except as noted in HPI or Assessment and Plan.  Objective  Well appearing patient in no apparent distress; mood and affect are within normal limits.  A full examination was performed including scalp, head, eyes, ears, nose, lips, neck, chest, axillae, abdomen, back, buttocks, bilateral upper extremities, bilateral lower extremities, hands, feet, fingers, toes, fingernails, and toenails. All findings within normal limits unless otherwise noted below.   Relevant physical exam findings are noted in the Assessment and Plan.    Assessment & Plan   SKIN CANCER SCREENING PERFORMED TODAY.  ACTINIC DAMAGE - Chronic condition, secondary to cumulative UV/sun exposure - diffuse scaly erythematous macules with underlying dyspigmentation - Recommend daily broad spectrum sunscreen SPF 30+ to sun-exposed areas, reapply every 2 hours as needed.  - Staying in the shade or wearing long sleeves, sun glasses (UVA+UVB protection) and wide brim hats (4-inch brim around the entire circumference of the hat) are also recommended for sun protection.  - Call for new or changing lesions.  LENTIGINES, SEBORRHEIC KERATOSES, HEMANGIOMAS - Benign normal skin lesions - Benign-appearing - Call for any changes  MELANOCYTIC NEVI - Tan-brown and/or pink-flesh-colored symmetric macules and papules; pink flesh  papule R post hip - Left Post Lat Thigh 1 mm medium dark brown macule  - Benign appearing on exam today - Observation - Call clinic for new or changing moles - Recommend daily use of broad spectrum spf 30+ sunscreen to sun-exposed areas.   EPIDERMAL INCLUSION CYST Exam: firm subcutaneous nodule with punctum at left zygoma  Benign-appearing. Exam most consistent with an epidermal inclusion cyst. Discussed that a cyst is a benign growth that can grow over time and sometimes get irritated or inflamed. Recommend observation if it is not bothersome. Discussed option of surgical excision to remove it if it is growing, symptomatic, or other changes noted. Please call for new or changing lesions so they can be evaluated.   Return in about 1 year (around 07/02/2025) for TBSE.  IAndrea Kerns, CMA, am acting as scribe for Rexene Rattler, MD .   Documentation: I have reviewed the above documentation for accuracy and completeness, and I agree with the above.  Rexene Rattler, MD

## 2024-08-13 ENCOUNTER — Encounter: Payer: Self-pay | Admitting: Nurse Practitioner

## 2024-08-13 ENCOUNTER — Ambulatory Visit
Admission: RE | Admit: 2024-08-13 | Discharge: 2024-08-13 | Disposition: A | Discharge status: Home / Self Care | Source: Ambulatory Visit | Attending: Nurse Practitioner | Admitting: Nurse Practitioner

## 2024-08-13 ENCOUNTER — Ambulatory Visit: Admitting: Nurse Practitioner

## 2024-08-13 VITALS — BP 104/89 | HR 75 | Temp 99.6°F | Resp 18 | Ht <= 58 in | Wt 125.0 lb

## 2024-08-13 DIAGNOSIS — G894 Chronic pain syndrome: Secondary | ICD-10-CM | POA: Diagnosis not present

## 2024-08-13 DIAGNOSIS — M25552 Pain in left hip: Secondary | ICD-10-CM | POA: Insufficient documentation

## 2024-08-13 DIAGNOSIS — G8929 Other chronic pain: Secondary | ICD-10-CM | POA: Insufficient documentation

## 2024-08-13 DIAGNOSIS — M1712 Unilateral primary osteoarthritis, left knee: Secondary | ICD-10-CM | POA: Insufficient documentation

## 2024-08-13 DIAGNOSIS — Z79891 Long term (current) use of opiate analgesic: Secondary | ICD-10-CM | POA: Insufficient documentation

## 2024-08-13 DIAGNOSIS — M5481 Occipital neuralgia: Secondary | ICD-10-CM | POA: Insufficient documentation

## 2024-08-13 MED ORDER — BUPRENORPHINE 15 MCG/HR TD PTWK: 1.0000 | MEDICATED_PATCH | TRANSDERMAL | 2 refills | Status: DC

## 2024-08-13 MED ORDER — OXYCODONE HCL 5 MG PO TABS: 5.0000 mg | ORAL_TABLET | Freq: Two times a day (BID) | ORAL | 0 refills | Status: AC | PRN

## 2024-08-13 MED ORDER — TIZANIDINE HCL 2 MG PO TABS
1.0000 mg | ORAL_TABLET | Freq: Four times a day (QID) | ORAL | 5 refills | Status: AC
Start: 2024-08-13 — End: ?

## 2024-08-13 MED ORDER — OXYCODONE HCL 5 MG PO TABS: 5.0000 mg | ORAL_TABLET | Freq: Three times a day (TID) | ORAL | 0 refills | Status: AC | PRN

## 2024-08-13 MED ORDER — OXYCODONE HCL 5 MG PO TABS: 5.0000 mg | ORAL_TABLET | Freq: Two times a day (BID) | ORAL | 0 refills | Status: DC | PRN

## 2024-08-13 NOTE — Progress Notes (Signed)
 PROVIDER NOTE: Interpretation of information contained herein should be left to medically-trained personnel. Specific patient instructions are provided elsewhere under Patient Instructions section of medical record. This document was created in part using AI and STT-dictation technology, any transcriptional errors that may result from this process are unintentional.  Patient: Sandra Mcconnell  Service: E/M   PCP: Glover Lenis, MD  DOB: October 04, 1941  DOS: 08/13/2024  Provider: Emmy MARLA Blanch, NP  MRN: [id number]  Delivery: Face-to-face  Specialty: Interventional Pain Management  Type: Established Patient  Setting: Ambulatory outpatient facility  Specialty designation: 09  Referring Prov.: Glover Lenis, MD  Location: Outpatient office facility       History of present illness (HPI) Sandra Mcconnell, a 83 y.o. year old female, is here today because of her Left hip pain and leg and headache. Sandra Mcconnell's primary complain today is Hip Pain (Left Hip and Leg) and Headache  Pertinent problems: Ms. Mcclarty has Cervical spinal cord compression (HCC); Anxiety; Depression; Restless leg syndrome; Myofascial pain syndrome; Lumbar radiculopathy; Cervical spondylolysis; and Chronic pain syndrome on the pertinent problem list.  Pain Assessment: Severity of Chronic pain is reported as a 7 /10. Location: Hip Left/Left Leg. Onset: More than a month ago. Quality: Constant, Aching, Shooting. Timing: Constant. Modifying factor(s): Heat. Vitals:  height is 4' 7 (1.397 m) and weight is 125 lb (56.7 kg). Her temporal temperature is 99.6 F (37.6 C). Her blood pressure is 104/89 and her pulse is 75. Her respiration is 18 and oxygen saturation is 99%.  BMI: Estimated body mass index is 29.05 kg/m as calculated from the following:   Height as of this encounter: 4' 7 (1.397 m).   Weight as of this encounter: 125 lb (56.7 kg).  Last encounter: 05/21/2024. Last procedure: Visit date not found.  Reason for encounter: medication  management. No change in medical history since last visit.  Patient's pain is at baseline.  Patient continues multimodal pain regimen as prescribed.  States that it provides pain relief and improvement in functional status.  The patient continues experiencing left hip pain radiating to the left leg and also reports headaches.  However, following occipital nerve block, she no longer experiencing the intense headache pain she had prior to the procedure.  Currently, her primary complaint is increased pain in the left hip. Pharmacotherapy Assessment   Analgesic: Butrans  15 mcg/h patch, 1 patch onto the skin once a week. Oxycodone  (Oxy IR/Roxicodone ) 5 mg immediate release tablet 2 times daily as needed as needed for severe pain. MME=15 Tizanidine  2 mg tablet Monitoring: [place] PMP: PDMP reviewed during this encounter.       Pharmacotherapy: No side-effects or adverse reactions reported. Compliance: No problems identified. Effectiveness: Clinically acceptable.  Sandra Mcconnell, [place]  08/13/2024 10:17 AM  Sign when Signing Visit Nursing Pain Medication Assessment:  Safety precautions to be maintained throughout the outpatient stay will include: orient to surroundings, keep bed in low position, maintain call bell within reach at all times, provide assistance with transfer out of bed and ambulation.  Medication Inspection Compliance: Pill count conducted under aseptic conditions, in front of the patient. Neither the pills nor the bottle was removed from the patient's sight at any time. Once count was completed pills were immediately returned to the patient in their original bottle.  Medication: Buprenorphine  patch (Butrans ) Pill/Patch Count: 0 of 4 pills/patches remain Pill/Patch Appearance: Markings consistent with prescribed medication Bottle Appearance: Standard pharmacy container. Clearly labeled. Filled Date: 09 / 10 / 2025  Last Medication intake:  Yesterday Nursing Pain Medication Assessment:  Safety  precautions to be maintained throughout the outpatient stay will include: orient to surroundings, keep bed in low position, maintain call bell within reach at all times, provide assistance with transfer out of bed and ambulation.   Medication Inspection Compliance: Pill count conducted under aseptic conditions, in front of the patient. Neither the pills nor the bottle was removed from the patient's sight at any time. Once count was completed pills were immediately returned to the patient in their original bottle.  Medication: Oxycodone  IR Pill/Patch Count: 34 of 60 pills/patches remain Pill/Patch Appearance: Markings consistent with prescribed medication Bottle Appearance: Standard pharmacy container. Clearly labeled. Filled Date: 09 / 19 / 2025 Last Medication intake:  Yesterday    UDS:  Summary  Date Value Ref Range Status  08/03/2023 Note  Final    Comment:    ==================================================================== Compliance Drug Analysis, Ur ==================================================================== Test                             Result       Flag       Units  Drug Present and Declared for Prescription Verification   Oxymorphone                    48           EXPECTED   ng/mg creat   Noroxycodone                   718          EXPECTED   ng/mg creat   Noroxymorphone                 326          EXPECTED   ng/mg creat    Oxymorphone, noroxycodone and noroxymorphone are expected    metabolites of oxycodone . Noroxymorphone is an expected metabolite    of oxymorphone. Sources of oxycodone  and/or oxymorphone include    scheduled prescription medications.    Buprenorphine                   7            EXPECTED   ng/mg creat   Norbuprenorphine               6            EXPECTED   ng/mg creat    Source of buprenorphine  is a scheduled prescription medication.    Norbuprenorphine is an expected metabolite of buprenorphine .    Gabapentin                       PRESENT      EXPECTED   Sertraline                      PRESENT      EXPECTED   Desmethylsertraline            PRESENT      EXPECTED    Desmethylsertraline is an expected metabolite of sertraline .    Acetaminophen                   PRESENT      EXPECTED  Drug Absent but Declared for Prescription Verification   Oxycodone   Not Detected UNEXPECTED ng/mg creat    Oxycodone  is almost always present in patients taking this drug    consistently.  Absence of oxycodone  could be due to lapse of time    since the last dose or unusual pharmacokinetics (rapid metabolism).    Tizanidine                      Not Detected UNEXPECTED    Tizanidine , as indicated in the declared medication list, is not    always detected even when used as directed.    Quetiapine                      Not Detected UNEXPECTED   Diclofenac                     Not Detected UNEXPECTED    Diclofenac, as indicated in the declared medication list, is not    always detected even when used as directed.    Salicylate                     Not Detected UNEXPECTED    Aspirin, as indicated in the declared medication list, is not always    detected even when used as directed.    Lidocaine                       Not Detected UNEXPECTED    Lidocaine , as indicated in the declared medication list, is not    always detected even when used as directed.  ==================================================================== Test                      Result    Flag   Units      Ref Range   Creatinine              169              mg/dL      >=79 ==================================================================== Declared Medications:  The flagging and interpretation on this report are based on the  following declared medications.  Unexpected results may arise from  inaccuracies in the declared medications.   **Note: The testing scope of this panel includes these medications:   Gabapentin  (Neurontin )  Oxycodone   Quetiapine   (Seroquel )  Sertraline  (Zoloft )   **Note: The testing scope of this panel does not include small to  moderate amounts of these reported medications:   Acetaminophen  (Tylenol )  Aspirin  Buprenorphine  Patch (BuTrans )  Diclofenac  Lidocaine  (Xylocaine )  Tizanidine    **Note: The testing scope of this panel does not include the  following reported medications:   Brimonidine (Alphagan)  Calcium  Dorzolamide (Trusopt)  Eye Drops  Fluticasone   Glucosamine  Ketorolac (Toradol)  Melatonin  Multivitamin  Omeprazole (Prilosec)  Ondansetron  (Zofran )  Prazosin  (Minipress )  Probiotic  Raloxifene (Evista)  Ropinirole  (Requip )  Simethicone   Topical ==================================================================== For clinical consultation, please call [phone]. ====================================================================     No results found for: CBDTHCR No results found for: D8THCCBX No results found for: D9THCCBX  ROS  Constitutional: Denies any fever or chills Gastrointestinal: No reported hemesis, hematochezia, vomiting, or acute GI distress Musculoskeletal: Left hip pain, left leg pain, headache Neurological: No reported episodes of acute onset apraxia, aphasia, dysarthria, agnosia, amnesia, paralysis, loss of coordination, or loss of consciousness  Medication Review  Calcium 600+D3 Plus Minerals, Ginger Root, Glucosamine Sulfate, Multiple Vitamins, Simethicone , acetaminophen , aspirin EC, atropine, brimonidine, buprenorphine , cyclopentolate ,  dorzolamide, fluticasone , gabapentin , ketorolac, melatonin, omeprazole, ondansetron , oxyCODONE , prazosin , rOPINIRole , raloxifene, sertraline , and tiZANidine   History Review  Allergy: Ms. Ekstrand is allergic to aripiprazole, ciprofloxacin, mirtazapine, and penicillins. Drug: Ms. Cupples  reports no history of drug use. Alcohol:  reports no history of alcohol use. Tobacco:  reports that she has never smoked. She has  never used smokeless tobacco. Social: Ms. Morreale  reports that she has never smoked. She has never used smokeless tobacco. She reports that she does not drink alcohol and does not use drugs. Medical:  has a past medical history of Anemia (08/04/2017), Anxiety, Carotid artery disease, Carpal tunnel syndrome, Cataract cortical, senile, Cervical spinal cord compression (HCC), Chicken pox, Colitis, Complication of anesthesia, Depression, Dizziness, DJD (degenerative joint disease), Dysphagia, GERD (gastroesophageal reflux disease), Hallux rigidus, Hemorrhoids, History of hiatal hernia, History of palpitations, HLD (hyperlipidemia), Hypertension, IBS (irritable bowel syndrome), Iritis, Lactose intolerance, Migraines, Neuropathy, OCD (obsessive compulsive disorder), OSA on CPAP, Osteoarthritis, Osteoporosis, Panic attacks, PONV (postoperative nausea and vomiting), Psoriasis, PTSD (post-traumatic stress disorder), Redundant colon, REM sleep behavior disorder, Rosacea, Slow transit constipation, Spondylosis, and Stroke (HCC) (08/2000). Surgical: Ms. Feuerstein  has a past surgical history that includes Hand surgery; Spine surgery; Tonsillectomy; Abdominal hysterectomy; Back surgery; Foot Fusion (Bilateral); Eye surgery; TORUS PALATINUS (2007); Cataract extraction w/PHACO (Right, 12/08/2015); Colonoscopy; Esophagogastroduodenoscopy; Esophagogastroduodenoscopy (N/A, 11/15/2017); Colonoscopy with propofol  (N/A, 11/15/2017); Intramedullary (im) nail intertrochanteric (Left, 11/04/2020); and Hardware Removal (Left, 04/29/2021). Family: family history includes Breast cancer in her mother.  Laboratory Chemistry Profile   Renal Lab Results  Component Value Date   BUN 16 02/18/2024   CREATININE 0.71 02/18/2024   GFRAA >60 09/06/2015   GFRNONAA >60 02/18/2024    Hepatic Lab Results  Component Value Date   AST 21 02/18/2024   ALT 16 02/18/2024   ALBUMIN 3.9 02/18/2024   ALKPHOS 68 02/18/2024   LIPASE 29 02/18/2024     Electrolytes Lab Results  Component Value Date   NA 138 02/18/2024   K 3.6 02/18/2024   CL 104 02/18/2024   CALCIUM 9.1 02/18/2024   MG 1.9 11/12/2020    Bone No results found for: VD25OH, VD125OH2TOT, CI6874NY7, CI7874NY7, 25OHVITD1, 25OHVITD2, 25OHVITD3, TESTOFREE, TESTOSTERONE  Inflammation (CRP: Acute Phase) (ESR: Chronic Phase) No results found for: CRP, ESRSEDRATE, LATICACIDVEN       Note: Above Lab results reviewed.  Recent Imaging Review  CT L-SPINE NO CHARGE CLINICAL DATA:  Chronic pain with vague abdominal pain in tenderness  EXAM: CT LUMBAR SPINE WITHOUT CONTRAST  TECHNIQUE: Multidetector CT imaging of the lumbar spine was performed without intravenous contrast administration. Multiplanar CT image reconstructions were also generated.  RADIATION DOSE REDUCTION: This exam was performed according to the departmental dose-optimization program which includes automated exposure control, adjustment of the mA and/or kV according to patient size and/or use of iterative reconstruction technique.  COMPARISON:  None Available.  FINDINGS: Segmentation: 5 lumbar type vertebrae  Alignment: Mild degenerative anterolisthesis at L3-4 and L4-5. Mild scoliosis  Vertebrae: Remote L1 superior endplate fracture with moderate height loss. Subjective generalized osteopenia. No acute fracture  Paraspinal and other soft tissues: No perispinal inflammation or masslike finding seen  Disc levels: Generalized disc narrowing and endplate degeneration, disc collapse greatest at T10-11, T11-12, and L4-5. Diffuse degenerative facet spurring as well. Mild to moderate spinal canal narrowing at L2-3 to L4-5. Notable foraminal impingement on the right at L4-5. Moderate left foraminal narrowing at L1-2 and L2-3.  IMPRESSION: No acute finding.  Remote L1 compression fracture.  Advanced lumbar  spine degeneration with scoliosis and listhesis  as described.  Electronically Signed   By: Dorn Roulette M.D.   On: 02/18/2024 08:50 CT ABDOMEN PELVIS W CONTRAST CLINICAL DATA:  Abdominal pain, acute in nonlocalized.  EXAM: CT ABDOMEN AND PELVIS WITH CONTRAST  TECHNIQUE: Multidetector CT imaging of the abdomen and pelvis was performed using the standard protocol following bolus administration of intravenous contrast.  RADIATION DOSE REDUCTION: This exam was performed according to the departmental dose-optimization program which includes automated exposure control, adjustment of the mA and/or kV according to patient size and/or use of iterative reconstruction technique.  CONTRAST:  75mL OMNIPAQUE  IOHEXOL  300 MG/ML  SOLN  COMPARISON:  09/06/2015  FINDINGS: Lower chest:  No contributory findings.  Hepatobiliary: Subcutaneous mass in the right lobe liver measuring 16 mm, contracted from before when there was more typical hemangioma features, interval involution/sclerosis. Accentuated vessels in segment 4B likely from shunt. No acute or interval finding.  Pancreas: Assessment is limited by motion. No acute or aggressive finding.  Spleen: Unremarkable.  Adrenals/Urinary Tract: Negative adrenals. No hydronephrosis or stone. Cystic lesion in the interpolar right kidney measuring 12 mm on the delayed phase, some internal septation or peripheral enhancement is present but stable if not progressed size since 2016, non worrisome especially for patient age. Unremarkable bladder.  Stomach/Bowel: Tortuous colon with extensive stool retention but no obstruction or over distention. No bowel inflammation seen throughout the abdomen. No visible appendix.  Vascular/Lymphatic: No acute vascular abnormality. Diffuse atheromatous calcification no mass or adenopathy.  Reproductive:No pathologic findings.  Hysterectomy.  Other: No ascites or pneumoperitoneum.  Musculoskeletal: No acute abnormalities. Remote L1  compression fracture. Advanced lumbar spine degeneration. Prior left proximal femur nail fixation and cerclage wire.  IMPRESSION: No acute finding. Chronic findings are stable or regressed from 2016 and noted above.  Electronically Signed   By: Dorn Roulette M.D.   On: 02/18/2024 08:25 Note: Reviewed        Physical Exam  Vitals: BP 104/89 (BP Location: Right Arm, Patient Position: Sitting, Cuff Size: Normal)   Pulse 75   Temp 99.6 F (37.6 C) (Temporal)   Resp 18   Ht 4' 7 (1.397 m)   Wt 125 lb (56.7 kg)   SpO2 99%   BMI 29.05 kg/m  BMI: Estimated body mass index is 29.05 kg/m as calculated from the following:   Height as of this encounter: 4' 7 (1.397 m).   Weight as of this encounter: 125 lb (56.7 kg). Ideal: Patient must be at least 60 in tall to calculate ideal body weight General appearance: Well nourished, well developed, and well hydrated. In no apparent acute distress Mental status: Alert, oriented x 3 (person, place, & time)       Respiratory: No evidence of acute respiratory distress Eyes: PERLA  Musculoskeletal: Left hip pain worse with walking, standing, sitting, and weightbearing  Lumbar Exam  Skin & Axial Inspection: No masses, redness, or swelling Alignment: Symmetrical Functional ROM: Pain restricted ROM       Stability: No instability detected Muscle Tone/Strength: Functionally intact. No obvious neuro-muscular anomalies detected. Sensory (Neurological): Musculoskeletal pain pattern Palpation: No palpable anomalies       Provocative Tests: Hyperextension/rotation test: deferred today       Lumbar quadrant test (Kemp's test): deferred today       Lateral bending test: deferred today       Patrick's Maneuver: (+) for left hip arthralgia and for left hip arthralgia FABER* test: (+) for left hip arthralgia  S-I anterior distraction/compression test: deferred today         *(Flexion, ABduction and External Rotation) Assessment    Diagnosis Status  1. Chronic pain syndrome   2. Chronic hip pain, left   3. Bilateral occipital neuralgia   4. Arthritis of left knee   5. Encounter for long-term opiate analgesic use    Controlled Persistent Controlled   Updated Problems: Problem  Chronic Hip Pain, Left    Plan of Care  Problem-specific:  Assessment and Plan  Chronic pain syndrome: Patient's pain is well-controlled with oxycodone  and Butrans  patch; however due to chronic hip pain I discussed to increasing the oxycodone  frequency as every 8 hours as needed for pain, limited to this month only.  Current drug monitoring (PDMP) reviewed, findings consistent with the use of prescribed medication and no evidence of narcotic misuse or abuse. Routine UDS ordered today.  Schedule follow-up in 90 days for medication management.  Chronic hip pain (left): The patient continues experiencing left hip pain radiating to the left leg, which impact on her daily activities.  We discussed to order left hip x-ray for further evaluation.   Ms. GAYE SCORZA has a current medication list which includes the following long-term medication(s): calcium 600+d3 plus minerals, gabapentin , omeprazole, prazosin , prazosin , ropinirole , sertraline , and simethicone .  Pharmacotherapy (Medications Ordered): Meds ordered this encounter  Medications   oxyCODONE  (OXY IR/ROXICODONE ) 5 MG immediate release tablet    Sig: Take 1 tablet (5 mg total) by mouth every 8 (eight) hours as needed for severe pain (pain score 7-10). Must last 30 days.    Dispense:  90 tablet    Refill:  0    Chronic Pain: STOP Act (Not applicable) Fill 1 day early if closed on refill date. Avoid benzodiazepines within 8 hours of opioids   oxyCODONE  (OXY IR/ROXICODONE ) 5 MG immediate release tablet    Sig: Take 1 tablet (5 mg total) by mouth 2 (two) times daily as needed for severe pain (pain score 7-10). Must last 30 days.    Dispense:  60 tablet    Refill:  0    Chronic Pain:  STOP Act (Not applicable) Fill 1 day early if closed on refill date. Avoid benzodiazepines within 8 hours of opioids   oxyCODONE  (OXY IR/ROXICODONE ) 5 MG immediate release tablet    Sig: Take 1 tablet (5 mg total) by mouth 2 (two) times daily as needed for severe pain (pain score 7-10). Must last 30 days.    Dispense:  60 tablet    Refill:  0    Chronic Pain: STOP Act (Not applicable) Fill 1 day early if closed on refill date. Avoid benzodiazepines within 8 hours of opioids   tiZANidine  (ZANAFLEX ) 2 MG tablet    Sig: Take 0.5 tablets (1 mg total) by mouth 4 (four) times daily.    Dispense:  60 tablet    Refill:  5   buprenorphine  (BUTRANS ) 15 MCG/HR    Sig: Place 1 patch onto the skin once a week.    Dispense:  4 patch    Refill:  2    Chronic Pain: STOP Act (Not applicable) Fill 1 day early if closed on refill date. Avoid benzodiazepines within 8 hours of opioids   Orders:  Orders Placed This Encounter  Procedures   DG HIP UNILAT W OR W/O PELVIS 2-3 VIEWS LEFT    Please describe any evidence of DJD, such as joint narrowing, asymmetry, cysts, or any anomalies in bone density, production,  or erosion.    Standing Status:   Future    Number of Occurrences:   1    Expiration Date:   11/13/2024    Scheduling Instructions:     Please make sure that the patient understands that this needs to be done as soon as possible. Never have the patient do the imaging just before the next appointment. Inform patient that having the imaging done within the [facility] will expedite the availability of the results and will provide      imaging availability to the requesting physician. In addition inform the patient that the imaging order has an expiration date and will not be renewed if not done within the active period.    Reason for Exam (SYMPTOM  OR DIAGNOSIS REQUIRED):   Left hip pain/arthralgia    Preferred imaging location?:   [facility]    Call Results- Best Contact Number?:   [phone]  [facility]    Release to patient:   Immediate   Drug Screen 10 W/Conf, Serum    Release to patient:   Immediate        Return in about 1 week (around 08/20/2024) for (VV), review of ordered tests, Emmy Blanch NP.    Recent Visits Date Type Provider Dept  05/21/24 Office Visit Sharice Harriss K, NP [facility]  Showing recent visits within past 90 days and meeting all other requirements Today's Visits Date Type Provider Dept  08/13/24 Office Visit Celia Gibbons K, NP [facility]  Showing today's visits and meeting all other requirements Future Appointments Date Type Provider Dept  08/20/24 Appointment Wrenley Sayed K, NP [facility]  11/05/24 Appointment Aleathea Pugmire K, NP [facility]  Showing future appointments within next 90 days and meeting all other requirements  I discussed the assessment and treatment plan with the patient. The patient was provided an opportunity to ask questions and all were answered. The patient agreed with the plan and demonstrated an understanding of the instructions.  Patient advised to call back or seek an in-person evaluation if the symptoms or condition worsens.  Duration of encounter: 30 minutes.  Total time on encounter, as per AMA guidelines included both the face-to-face and non-face-to-face time personally spent by the physician and/or other qualified health care professional(s) on the day of the encounter (includes time in activities that require the physician or other qualified health care professional and does not include time in activities normally performed by clinical staff). Physician's time may include the following activities when performed: Preparing to see the patient (e.g., pre-charting review of records, searching for previously ordered imaging, lab work, and nerve conduction tests) Review of prior analgesic pharmacotherapies. Reviewing  PMP Interpreting ordered tests (e.g., lab work, imaging, nerve conduction tests) Performing post-procedure evaluations, including interpretation of diagnostic procedures Obtaining and/or reviewing separately obtained history Performing a medically appropriate examination and/or evaluation Counseling and educating the patient/family/caregiver Ordering medications, tests, or procedures Referring and communicating with other health care professionals (when not separately reported) Documenting clinical information in the electronic or other health record Independently interpreting results (not separately reported) and communicating results to the patient/ family/caregiver Care coordination (not separately reported)  Note by: Bluma Buresh K Demetrio Leighty, NP (TTS and AI technology used. I apologize for any typographical errors that were not detected and corrected.) Date: 08/13/2024; Time: 1:58 PM

## 2024-08-13 NOTE — Progress Notes (Signed)
 Nursing Pain Medication Assessment:  Safety precautions to be maintained throughout the outpatient stay will include: orient to surroundings, keep bed in low position, maintain call bell within reach at all times, provide assistance with transfer out of bed and ambulation.  Medication Inspection Compliance: Pill count conducted under aseptic conditions, in front of the patient. Neither the pills nor the bottle was removed from the patient's sight at any time. Once count was completed pills were immediately returned to the patient in their original bottle.  Medication: Buprenorphine  patch (Butrans ) Pill/Patch Count: 0 of 4 pills/patches remain Pill/Patch Appearance: Markings consistent with prescribed medication Bottle Appearance: Standard pharmacy container. Clearly labeled. Filled Date: 09 / 10 / 2025 Last Medication intake:  Yesterday Nursing Pain Medication Assessment:  Safety precautions to be maintained throughout the outpatient stay will include: orient to surroundings, keep bed in low position, maintain call bell within reach at all times, provide assistance with transfer out of bed and ambulation.   Medication Inspection Compliance: Pill count conducted under aseptic conditions, in front of the patient. Neither the pills nor the bottle was removed from the patient's sight at any time. Once count was completed pills were immediately returned to the patient in their original bottle.  Medication: Oxycodone  IR Pill/Patch Count: 34 of 60 pills/patches remain Pill/Patch Appearance: Markings consistent with prescribed medication Bottle Appearance: Standard pharmacy container. Clearly labeled. Filled Date: 09 / 19 / 2025 Last Medication intake:  Yesterday

## 2024-08-20 ENCOUNTER — Ambulatory Visit: Attending: Nurse Practitioner | Admitting: Nurse Practitioner

## 2024-08-20 ENCOUNTER — Encounter: Payer: Self-pay | Admitting: Nurse Practitioner

## 2024-08-20 VITALS — BP 122/62 | HR 70 | Temp 97.3°F | Resp 16 | Ht <= 58 in | Wt 125.0 lb

## 2024-08-20 DIAGNOSIS — M1712 Unilateral primary osteoarthritis, left knee: Secondary | ICD-10-CM | POA: Insufficient documentation

## 2024-08-20 DIAGNOSIS — M5481 Occipital neuralgia: Secondary | ICD-10-CM | POA: Insufficient documentation

## 2024-08-20 DIAGNOSIS — M25552 Pain in left hip: Secondary | ICD-10-CM | POA: Diagnosis present

## 2024-08-20 DIAGNOSIS — G8929 Other chronic pain: Secondary | ICD-10-CM | POA: Insufficient documentation

## 2024-08-20 DIAGNOSIS — M533 Sacrococcygeal disorders, not elsewhere classified: Secondary | ICD-10-CM | POA: Diagnosis present

## 2024-08-20 DIAGNOSIS — G894 Chronic pain syndrome: Secondary | ICD-10-CM | POA: Insufficient documentation

## 2024-08-20 LAB — OXYCODONES,MS,WB/SP RFX
Oxycocone: 3.4 ng/mL
Oxycodones Confirmation: POSITIVE
Oxymorphone: NEGATIVE ng/mL

## 2024-08-20 LAB — DRUG SCREEN 10 W/CONF, SERUM
Amphetamines, IA: NEGATIVE ng/mL
Barbiturates, IA: NEGATIVE ug/mL
Benzodiazepines, IA: NEGATIVE ng/mL
Cocaine & Metabolite, IA: NEGATIVE ng/mL
Methadone, IA: NEGATIVE ng/mL
Opiates, IA: NEGATIVE ng/mL
Oxycodones, IA: POSITIVE ng/mL — AB
Phencyclidine, IA: NEGATIVE ng/mL
Propoxyphene, IA: NEGATIVE ng/mL
THC(Marijuana) Metabolite, IA: NEGATIVE ng/mL

## 2024-08-20 NOTE — Progress Notes (Signed)
 PROVIDER NOTE: Interpretation of information contained herein should be left to medically-trained personnel. Specific patient instructions are provided elsewhere under Patient Instructions section of medical record. This document was created in part using AI and STT-dictation technology, any transcriptional errors that may result from this process are unintentional.  Patient: Sandra Mcconnell  Service: E/M   PCP: Glover Lenis, MD  DOB: 14-Jan-1941  DOS: 08/20/2024  Provider: Emmy MARLA Blanch, NP  MRN: [id number]  Delivery: Face-to-face  Specialty: Interventional Pain Management  Type: Established Patient  Setting: Ambulatory outpatient facility  Specialty designation: 09  Referring Prov.: Glover Lenis, MD  Location: Outpatient office facility       History of present illness (HPI) [name], a 83 y.o. year old female, is here today because of her Chronic pain syndrome [G89.4]. Sandra Mcconnell's primary complain today is Headache (Head), Neck Pain, and Hip Pain (Left/)  Pertinent problems: Sandra Mcconnell  has Cervical spinal cord compression (HCC); Anxiety; Depression; Restless leg syndrome; Myofascial pain syndrome; Lumbar radiculopathy; Cervical spondylolysis; and Chronic pain syndrome on the pertinent problem list.  Pain Assessment: Severity of Chronic pain is reported as a 8 /10. Location: Head Other (Comment)/Neck. Onset: More than a month ago. Quality: Aching, Constant, Pounding, Throbbing. Timing: Constant. Modifying factor(s): Medication. Vitals:  height is 4' 7 (1.397 m) and weight is 125 lb (56.7 kg). Her temporal temperature is 97.3 F (36.3 C) (abnormal). Her blood pressure is 122/62 and her pulse is 70. Her respiration is 16 and oxygen saturation is 98%.  BMI: Estimated body mass index is 29.05 kg/m as calculated from the following:   Height as of this encounter: 4' 7 (1.397 m).   Weight as of this encounter: 125 lb (56.7 kg).  Last encounter: 08/13/2024. Last procedure: Visit date not  found.  Reason for encounter: follow-up evaluation.  The patient presents for evaluation of her left hip x-ray.  She continues to experience left hip pain despite prior surgical intervention.  She is accompanied by family member.  Discussed the use of AI scribe software for clinical note transcription with the patient, who gave verbal consent to proceed.  History of Present Illness   Sandra Mcconnell is an 83 year old female who presents for pain management related to left hip and knee pain. She is accompanied by a family member.  She has a history of a left hip fracture from December 2021, which required surgical intervention with a rod and screws for stabilization. Post-surgical complications included a broken screw, part of which was removed, while the remainder was left in place due to surgical risk. Despite these interventions, she continues to experience significant pain in her left hip and knee.  The pain is localized to her left side, affecting both her hip and knee, and has persisted since the surgery. It is severe enough to impact her quality of life. Her current pain management regimen includes the use of a Butrans  patch and oxycodone , which she takes twice daily, with an additional dose as needed for increased pain, particularly during colder weather. She notes that her pain levels fluctuate, with some days being more manageable than others. She has also undergone various therapies in an attempt to alleviate her symptoms, but the pain remains a significant issue.  No pain in the right hip.     Pharmacotherapy Assessment   Monitoring: [place] PMP: PDMP not reviewed this encounter.       Pharmacotherapy: No side-effects or adverse reactions reported. Compliance: No problems  identified. Effectiveness: Clinically acceptable.  Sandra Mcconnell, Sandra Mcconnell  08/20/2024 11:04 AM  Sign when Signing Visit Safety precautions to be maintained throughout the outpatient stay will include: orient to surroundings,  keep bed in low position, maintain call bell within reach at all times, provide assistance with transfer out of bed and ambulation.     UDS:  Summary  Date Value Ref Range Status  08/03/2023 Note  Final    Comment:    ==================================================================== Compliance Drug Analysis, Ur ==================================================================== Test                             Result       Flag       Units  Drug Present and Declared for Prescription Verification   Oxymorphone                    48           EXPECTED   ng/mg creat   Noroxycodone                   718          EXPECTED   ng/mg creat   Noroxymorphone                 326          EXPECTED   ng/mg creat    Oxymorphone, noroxycodone and noroxymorphone are expected    metabolites of oxycodone . Noroxymorphone is an expected metabolite    of oxymorphone. Sources of oxycodone  and/or oxymorphone include    scheduled prescription medications.    Buprenorphine                   7            EXPECTED   ng/mg creat   Norbuprenorphine               6            EXPECTED   ng/mg creat    Source of buprenorphine  is a scheduled prescription medication.    Norbuprenorphine is an expected metabolite of buprenorphine .    Gabapentin                      PRESENT      EXPECTED   Sertraline                      PRESENT      EXPECTED   Desmethylsertraline            PRESENT      EXPECTED    Desmethylsertraline is an expected metabolite of sertraline .    Acetaminophen                   PRESENT      EXPECTED  Drug Absent but Declared for Prescription Verification   Oxycodone                       Not Detected UNEXPECTED ng/mg creat    Oxycodone  is almost always present in patients taking this drug    consistently.  Absence of oxycodone  could be due to lapse of time    since the last dose or unusual pharmacokinetics (rapid metabolism).    Tizanidine                      Not Detected UNEXPECTED  Tizanidine , as indicated in the declared medication list, is not    always detected even when used as directed.    Quetiapine                      Not Detected UNEXPECTED   Diclofenac                     Not Detected UNEXPECTED    Diclofenac, as indicated in the declared medication list, is not    always detected even when used as directed.    Salicylate                     Not Detected UNEXPECTED    Aspirin, as indicated in the declared medication list, is not always    detected even when used as directed.    Lidocaine                       Not Detected UNEXPECTED    Lidocaine , as indicated in the declared medication list, is not    always detected even when used as directed.  ==================================================================== Test                      Result    Flag   Units      Ref Range   Creatinine              169              mg/dL      >=79 ==================================================================== Declared Medications:  The flagging and interpretation on this report are based on the  following declared medications.  Unexpected results may arise from  inaccuracies in the declared medications.   **Note: The testing scope of this panel includes these medications:   Gabapentin  (Neurontin )  Oxycodone   Quetiapine  (Seroquel )  Sertraline  (Zoloft )   **Note: The testing scope of this panel does not include small to  moderate amounts of these reported medications:   Acetaminophen  (Tylenol )  Aspirin  Buprenorphine  Patch (BuTrans )  Diclofenac  Lidocaine  (Xylocaine )  Tizanidine    **Note: The testing scope of this panel does not include the  following reported medications:   Brimonidine (Alphagan)  Calcium  Dorzolamide (Trusopt)  Eye Drops  Fluticasone   Glucosamine  Ketorolac (Toradol)  Melatonin  Multivitamin  Omeprazole (Prilosec)  Ondansetron  (Zofran )  Prazosin  (Minipress )  Probiotic  Raloxifene (Evista)  Ropinirole  (Requip )   Simethicone   Topical ==================================================================== For clinical consultation, please call [phone]. ====================================================================     No results found for: CBDTHCR No results found for: D8THCCBX No results found for: D9THCCBX  ROS  Constitutional: Denies any fever or chills Gastrointestinal: No reported hemesis, hematochezia, vomiting, or acute GI distress Musculoskeletal: Left hip pain Neurological: No reported episodes of acute onset apraxia, aphasia, dysarthria, agnosia, amnesia, paralysis, loss of coordination, or loss of consciousness  Medication Review  Calcium 600+D3 Plus Minerals, Ginger Root, Glucosamine Sulfate, Multiple Vitamins, Simethicone , acetaminophen , aspirin EC, atropine, brimonidine, buprenorphine , cyclopentolate , dorzolamide, fluticasone , gabapentin , ketorolac, melatonin, omeprazole, ondansetron , oxyCODONE , prazosin , rOPINIRole , raloxifene, sertraline , and tiZANidine   History Review  Allergy: Sandra Mcconnell is allergic to aripiprazole, ciprofloxacin, mirtazapine, and penicillins. Drug: Sandra Mcconnell  reports no history of drug use. Alcohol:  reports no history of alcohol use. Tobacco:  reports that she has never smoked. She has never used smokeless tobacco. Social: Sandra Mcconnell  reports that she has never smoked. She has never used smokeless tobacco. She reports  that she does not drink alcohol and does not use drugs. Medical:  has a past medical history of Anemia (08/04/2017), Anxiety, Carotid artery disease, Carpal tunnel syndrome, Cataract cortical, senile, Cervical spinal cord compression (HCC), Chicken pox, Colitis, Complication of anesthesia, Depression, Dizziness, DJD (degenerative joint disease), Dysphagia, GERD (gastroesophageal reflux disease), Hallux rigidus, Hemorrhoids, History of hiatal hernia, History of palpitations, HLD (hyperlipidemia), Hypertension, IBS (irritable bowel  syndrome), Iritis, Lactose intolerance, Migraines, Neuropathy, OCD (obsessive compulsive disorder), OSA on CPAP, Osteoarthritis, Osteoporosis, Panic attacks, PONV (postoperative nausea and vomiting), Psoriasis, PTSD (post-traumatic stress disorder), Redundant colon, REM sleep behavior disorder, Rosacea, Slow transit constipation, Spondylosis, and Stroke (HCC) (08/2000). Surgical: Sandra Mcconnell  has a past surgical history that includes Hand surgery; Spine surgery; Tonsillectomy; Abdominal hysterectomy; Back surgery; Foot Fusion (Bilateral); Eye surgery; TORUS PALATINUS (2007); Cataract extraction w/PHACO (Right, 12/08/2015); Colonoscopy; Esophagogastroduodenoscopy; Esophagogastroduodenoscopy (N/A, 11/15/2017); Colonoscopy with propofol  (N/A, 11/15/2017); Intramedullary (im) nail intertrochanteric (Left, 11/04/2020); and Hardware Removal (Left, 04/29/2021). Family: family history includes Breast cancer in her mother.  Laboratory Chemistry Profile   Renal Lab Results  Component Value Date   BUN 16 02/18/2024   CREATININE 0.71 02/18/2024   GFRAA >60 09/06/2015   GFRNONAA >60 02/18/2024    Hepatic Lab Results  Component Value Date   AST 21 02/18/2024   ALT 16 02/18/2024   ALBUMIN 3.9 02/18/2024   ALKPHOS 68 02/18/2024   LIPASE 29 02/18/2024    Electrolytes Lab Results  Component Value Date   NA 138 02/18/2024   K 3.6 02/18/2024   CL 104 02/18/2024   CALCIUM 9.1 02/18/2024   MG 1.9 11/12/2020    Bone No results found for: VD25OH, VD125OH2TOT, CI6874NY7, CI7874NY7, 25OHVITD1, 25OHVITD2, 25OHVITD3, TESTOFREE, TESTOSTERONE  Inflammation (CRP: Acute Phase) (ESR: Chronic Phase) No results found for: CRP, ESRSEDRATE, LATICACIDVEN       Note: Above Lab results reviewed.  Recent Imaging Review  DG HIP UNILAT W OR W/O PELVIS 2-3 VIEWS LEFT CLINICAL DATA:  Left hip pain, no known injury, initial encounter  EXAM: DG HIP (WITH OR WITHOUT PELVIS) 2-3V LEFT  COMPARISON:   04/29/2021  FINDINGS: Pelvic ring is intact. Degenerative changes of the right hip joint and lumbar spine are seen. Postsurgical changes in the proximal left femur are noted. No acute fracture or dislocation is noted. No soft tissue changes are seen.  IMPRESSION: No acute abnormality noted.  Electronically Signed   By: Oneil Devonshire M.D.   On: 08/18/2024 19:25 Note: Reviewed        Physical Exam  Vitals: BP 122/62 (BP Location: Left Arm, Patient Position: Sitting, Cuff Size: Normal)   Pulse 70   Temp (!) 97.3 F (36.3 C) (Temporal)   Resp 16   Ht 4' 7 (1.397 m)   Wt 125 lb (56.7 kg)   SpO2 98%   BMI 29.05 kg/m  BMI: Estimated body mass index is 29.05 kg/m as calculated from the following:   Height as of this encounter: 4' 7 (1.397 m).   Weight as of this encounter: 125 lb (56.7 kg). Ideal: Patient must be at least 60 in tall to calculate ideal body weight General appearance: Well nourished, well developed, and well hydrated. In no apparent acute distress Mental status: Alert, oriented x 3 (person, place, & time)       Respiratory: No evidence of acute respiratory distress Eyes: PERLA  Musculoskeletal: + left hip pain worse with weight bearing, walking, standing and sitting Assessment   Diagnosis Status  1. Chronic pain  syndrome   2. Chronic hip pain, left   3. Bilateral occipital neuralgia   4. Arthritis of left knee   5. Chronic left SI joint pain    Controlled Controlled Controlled   Updated Problems: No problems updated.  Plan of Care  Problem-specific:  Assessment and Plan    Chronic left hip and knee pain with post-surgical changes Chronic pain due to post-surgical changes from a complex intratrochanteric hip fracture. Further surgery not viable due to age and risk. Conservative management preferred. - Continue Butrans  patch. - Adjust oxycodone  to three times daily during December to February at her next visit.  - Reassess in March to reduce  oxycodone  to twice daily.  Her recent left hip x-rays showed no acute abnormality; however postsurgical changes in the proximal left femur are noted.  Degenerative changes of the right hip joint and lumbar spine noted.  No acute fracture or dislocation or soft tissue changes are noted.   Chronic pain syndrome Chronic pain syndrome impacting quality of life. Focus on optimizing pain management without surgery. Pain varies with weather. - Monitor and adjust pain management strategies. - Discuss medication adjustments during follow-ups.       Sandra Mcconnell has a current medication list which includes the following long-term medication(s): calcium 600+d3 plus minerals, gabapentin , omeprazole, prazosin , prazosin , ropinirole , sertraline , and simethicone .  Pharmacotherapy (Medications Ordered): No orders of the defined types were placed in this encounter.  Orders:  No orders of the defined types were placed in this encounter.       No follow-ups on file.    Recent Visits Date Type Provider Dept  08/13/24 Office Visit Floye Fesler K, NP [facility]  Showing recent visits within past 90 days and meeting all other requirements Today's Visits Date Type Provider Dept  08/20/24 Office Visit Chaia Ikard K, NP [facility]  Showing today's visits and meeting all other requirements Future Appointments Date Type Provider Dept  11/05/24 Appointment Tala Eber K, NP [facility]  Showing future appointments within next 90 days and meeting all other requirements  I discussed the assessment and treatment plan with the patient. The patient was provided an opportunity to ask questions and all were answered. The patient agreed with the plan and demonstrated an understanding of the instructions.  Patient advised to call back or seek an in-person evaluation if the symptoms or condition worsens.  I personally spent a total of 15 minutes in the care of the patient today  including preparing to see the patient, getting/reviewing separately obtained history, performing a medically appropriate exam/evaluation, counseling and educating, referring and communicating with other health care professionals, documenting clinical information in the EHR, independently interpreting results, communicating results, and coordinating care.   Note by: Fredrick Dray K Amahri Dengel, NP (TTS and AI technology used. I apologize for any typographical errors that were not detected and corrected.) Date: 08/20/2024; Time: 12:24 PM

## 2024-08-20 NOTE — Progress Notes (Signed)
 Safety precautions to be maintained throughout the outpatient stay will include: orient to surroundings, keep bed in low position, maintain call bell within reach at all times, provide assistance with transfer out of bed and ambulation.

## 2024-09-16 ENCOUNTER — Telehealth: Payer: Self-pay | Admitting: Student in an Organized Health Care Education/Training Program

## 2024-09-16 NOTE — Telephone Encounter (Signed)
 PT stated that she need to speak to someone her pain is bad.

## 2024-09-16 NOTE — Telephone Encounter (Signed)
 Patient wants to see Dr. Marcelino, she is having increased pain.

## 2024-09-23 ENCOUNTER — Telehealth: Payer: Self-pay | Admitting: Student in an Organized Health Care Education/Training Program

## 2024-09-23 NOTE — Telephone Encounter (Signed)
 Patient called asking to speak to Dr Marcelino. Wants to know why her appointment for tomorrow was canceled and rescheduled.   Dr Marcelino sent this message and requested we change the patient's schedule Please do not schedule this patient with me tomorrow. I have nothing to offer her  If she insists on seeing me, she can do it after the new year I will not be able to see her tomorrow as I have nothing to add regarding her treatment plan she is welcome to see Seema and seema can consult with me if she has ?s

## 2024-09-24 ENCOUNTER — Ambulatory Visit: Admitting: Student in an Organized Health Care Education/Training Program

## 2024-09-30 ENCOUNTER — Encounter: Payer: Self-pay | Admitting: *Deleted

## 2024-10-01 ENCOUNTER — Non-Acute Institutional Stay: Admitting: Internal Medicine

## 2024-10-01 ENCOUNTER — Encounter: Payer: Self-pay | Admitting: Internal Medicine

## 2024-10-01 VITALS — BP 122/66 | HR 68 | Temp 97.3°F | Ht <= 58 in | Wt 128.0 lb

## 2024-10-01 DIAGNOSIS — Z0001 Encounter for general adult medical examination with abnormal findings: Secondary | ICD-10-CM

## 2024-10-01 DIAGNOSIS — H532 Diplopia: Secondary | ICD-10-CM | POA: Diagnosis not present

## 2024-10-01 DIAGNOSIS — H5704 Mydriasis: Secondary | ICD-10-CM | POA: Diagnosis not present

## 2024-10-01 DIAGNOSIS — G894 Chronic pain syndrome: Secondary | ICD-10-CM | POA: Diagnosis not present

## 2024-10-01 DIAGNOSIS — M158 Other polyosteoarthritis: Secondary | ICD-10-CM

## 2024-10-01 DIAGNOSIS — G2581 Restless legs syndrome: Secondary | ICD-10-CM | POA: Diagnosis not present

## 2024-10-01 DIAGNOSIS — M7918 Myalgia, other site: Secondary | ICD-10-CM | POA: Diagnosis not present

## 2024-10-01 DIAGNOSIS — K219 Gastro-esophageal reflux disease without esophagitis: Secondary | ICD-10-CM | POA: Diagnosis not present

## 2024-10-01 DIAGNOSIS — M25552 Pain in left hip: Secondary | ICD-10-CM

## 2024-10-01 DIAGNOSIS — F332 Major depressive disorder, recurrent severe without psychotic features: Secondary | ICD-10-CM

## 2024-10-01 DIAGNOSIS — M2170 Unequal limb length (acquired), unspecified site: Secondary | ICD-10-CM | POA: Diagnosis not present

## 2024-10-01 DIAGNOSIS — G8929 Other chronic pain: Secondary | ICD-10-CM

## 2024-10-01 MED ORDER — KETOROLAC TROMETHAMINE 10 MG PO TABS: 10.0000 mg | ORAL_TABLET | Freq: Every day | ORAL | 2 refills | Status: AC | PRN

## 2024-10-01 MED ORDER — RALOXIFENE HCL 60 MG PO TABS: 60.0000 mg | ORAL_TABLET | Freq: Every day | ORAL | 3 refills | Status: AC

## 2024-10-01 MED ORDER — ROPINIROLE HCL 1 MG PO TABS: 1.0000 mg | ORAL_TABLET | Freq: Every day | ORAL | 3 refills | Status: AC

## 2024-10-01 MED ORDER — ONDANSETRON HCL 4 MG PO TABS: 4.0000 mg | ORAL_TABLET | Freq: Three times a day (TID) | ORAL | 2 refills | Status: AC | PRN

## 2024-10-01 NOTE — Assessment & Plan Note (Addendum)
 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 Pain is managed with occipital nerve blocks and medications. Kyphosis contributes to postural issues and neck pain. Use of a figure-eight brace has been beneficial for posture but causes discomfort when worn continuously. - Continue current pain management regimen including occipital nerve blocks and medications. - Encouraged use of figure-eight brace for posture training, but advised against continuous use to prevent muscle atrophy. - Discussed potential use of TENS unit for muscle stimulation, with caution due to skin irritation from adhesive.  Pt gets her buprenorphine  patch, oxycodone , neurontin , zanaflex  from Dr. Marcelino with [facility].  DO NOT REFILLS THESE Rx. These Rx come from Pain clinic.

## 2024-10-01 NOTE — Progress Notes (Signed)
 [facility] Outpatient Progress Note      Careteam: Patient Care Team: Sandra Locus, DO as PCP - General (Internal Medicine) PLACE OF SERVICE: [facility]   Advanced Directive information Does Patient Have a Medical Advance Directive?: Yes, Type of Advance Directive: Healthcare Power of [place];Living will;Out of facility DNR (pink MOST or yellow form), Does patient want to make changes to medical advance directive?: No - Patient declined   Allergies  Allergen Reactions   Aripiprazole Nausea And Vomiting    Dizziness   Ciprofloxacin Swelling   Mirtazapine Nausea And Vomiting    Dizziness   Penicillins Swelling    PCN reaction = swelling per patient.     Chief Complaint  Patient presents with   Establish Care    Establish Care     HPI: Patient is a 83 y.o. female seen in today in [facility] outpatient clinic.  Discussed the use of AI scribe software for clinical note transcription with the patient, who gave verbal consent to proceed.  History of Present Illness   Sandra Mcconnell is an 83 year old female who presents for a transition of care and management of chronic pain. She is accompanied by her husband, Sandra Mcconnell.  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, including anterior and posterior fusions from C2 to C6 - Constant cervical spine pain - Under care of a pain clinic - Current pain management includes BuTrans  patch, oxycodone , Neurontin , and  Zanaflex  - Receives annual bilateral occipital nerve blocks  Post-traumatic stress disorder and sleep disturbance - History of PTSD related to childhood trauma - Receives psychiatric and psychological care via video visits - Sleep disorders associated with PTSD  Glaucoma and ocular ischemic events - Diagnosed with glaucoma - History of two ocular strokes - Uses multiple eye drops for management - Left pupil is dilated to prevent iris movement  Chronic abdominal pain and constipation - Chronic abdominal pain attributed to redundant colon - Frequent constipation - Manages symptoms with dietary adjustments including prunes and high-fiber foods - Occasional use of laxatives  Other chronic symptoms and medication use - Uses Requip  for restless legs - Takes Evista  for bone health - Uses Prilosec for gastric issues - Uses Toradol  and Zofran  as needed for headaches and nausea      Review of Systems: Review of Systems  Constitutional:  Negative for chills, fever and weight loss.  HENT: Negative.    Eyes: Negative.   Respiratory: Negative.    Cardiovascular: Negative.   Gastrointestinal: Negative.   Genitourinary: Negative.   Musculoskeletal:  Positive for back pain, joint pain and neck pain.       Chronic neck, back, left hip pain. See pain clinic for her pain meds and muscle relaxers  Recent addition of left shoe lift to compensate for acquired shortened left leg after her hip fracture 4-5 years ago  Skin:  Positive for itching.       Itching with any type of  skin adhesive  Neurological:  Positive for weakness.       Chronic weakness of left leg. Uses walker all the time.  Endo/Heme/Allergies: Negative.   Psychiatric/Behavioral:  The patient is nervous/anxious.   All other systems reviewed and are negative.    Past Medical History:  Diagnosis Date   Anemia 08/04/2017   Anxiety    Carotid artery disease    Carpal tunnel syndrome    Cataract cortical, senile    Cervical  spinal cord compression (HCC)    Chicken pox    Colitis    Complication of anesthesia    Depression    Dizziness    DJD (degenerative joint disease)    Dysphagia    GERD (gastroesophageal reflux disease)    Hallux rigidus    Hemorrhoids    Hip fracture (HCC) 11/03/2020   History of hiatal hernia    History of palpitations    EMOTIONAL PALPITATIONS   HLD (hyperlipidemia)    Hypertension    IBS (irritable bowel syndrome)    Iritis    Lactose intolerance    Migraines    Neuropathy    OCD (obsessive compulsive disorder)    OSA on CPAP    Osteoarthritis    Osteoporosis    Panic attacks    PONV (postoperative nausea and vomiting)    Psoriasis    PTSD (post-traumatic stress disorder)    Redundant colon    REM sleep behavior disorder    Rosacea    Slow transit constipation    Spondylosis    CERVICAL,SLEEPS WITH HOB ELEVATED AND WEARS NECK SUPPORT   Stroke (HCC) 08/2000   Past Surgical History:  Procedure Laterality Date   ABDOMINAL HYSTERECTOMY     BACK SURGERY     CERVICAL FUSION 2003/DECOMPRESSION 2014 LUMBAR LAM 2009   CATARACT EXTRACTION W/PHACO Right 12/08/2015   Procedure: CATARACT EXTRACTION PHACO AND INTRAOCULAR LENS PLACEMENT (IOC);  Surgeon: Elsie Carmine, MD;  Location: [facility] ORS;  Service: Ophthalmology;  Laterality: Right;  US  01:09 AP% 22.6 CDE 15.81 fluid pack lot # [id number]   COLONOSCOPY     COLONOSCOPY WITH PROPOFOL  N/A 11/15/2017   Procedure: COLONOSCOPY WITH PROPOFOL ;  Surgeon: Viktoria Lamar DASEN, MD;  Location: [facility] ENDOSCOPY;  Service: Endoscopy;  Laterality: N/A;   ESOPHAGOGASTRODUODENOSCOPY     ESOPHAGOGASTRODUODENOSCOPY N/A 11/15/2017   Procedure: ESOPHAGOGASTRODUODENOSCOPY (EGD);  Surgeon: Viktoria Lamar DASEN, MD;  Location: [facility] ENDOSCOPY;  Service: Endoscopy;  Laterality: N/A;   EYE SURGERY     FOOT FUSION Bilateral    HAND SURGERY     HARDWARE REMOVAL Left 04/29/2021   Procedure: HARDWARE REMOVAL, Left screw removal, distal femur;  Surgeon: Kathlynn Sharper, MD;  Location: [facility] ORS;  Service: Orthopedics;  Laterality: Left;   INTRAMEDULLARY (IM) NAIL INTERTROCHANTERIC Left 11/04/2020   Procedure: INTRAMEDULLARY (IM) NAIL INTERTROCHANTRIC;  Surgeon: Kathlynn Sharper, MD;  Location: [facility] ORS;  Service: Orthopedics;  Laterality: Left;   SPINE SURGERY     TONSILLECTOMY     TORUS PALATINUS  2007   REMOVAL   Social History:  reports that she has never smoked. She has never used smokeless tobacco. She reports that she does not drink alcohol and does not use drugs.   Family History  Problem Relation Age of Onset   Breast cancer Mother        57's     Medications: Patient's Medications  New Prescriptions   No medications on file  Previous Medications   ACETAMINOPHEN  (TYLENOL ) 325 MG TABLET  Take 650 mg by mouth every 6 (six) hours as needed.   ACETAMINOPHEN  (TYLENOL ) 650 MG CR TABLET    Take 325 mg by mouth every 8 (eight) hours as needed.   ASPIRIN 81 MG EC TABLET    Take 81 mg by mouth daily in the afternoon.   ATROPINE 1 % OPHTHALMIC SOLUTION    SMARTSIG:In Eye(s)   BRIMONIDINE (ALPHAGAN) 0.2 % OPHTHALMIC SOLUTION    1 drop 2 (two) times daily.   BUPRENORPHINE  (BUTRANS ) 15 MCG/HR    Place 1 patch onto the skin once a week.   CALCIUM CARBONATE-VIT D-MIN (CALCIUM 600+D3 PLUS MINERALS) 600-800 MG-UNIT TABS    Take 1 tablet by mouth 2 (two) times daily.   CYCLOPENTOLATE  (CYCLODRYL,CYCLOGYL ) 1 % OPHTHALMIC SOLUTION    1 drop 3 (three) times daily.   DORZOLAMIDE (TRUSOPT) 2 % OPHTHALMIC SOLUTION    Apply to eye.   FLUTICASONE  (CUTIVATE ) 0.05 % CREAM    Apply topically 2 (two) times daily. Apply daily up to two times per day to affected areas of the body until rash clears.   GABAPENTIN  (NEURONTIN ) 800 MG TABLET    Take 1 tablet (800 mg total) by mouth 3 (three) times daily.   GINGER, ZINGIBER OFFICINALIS, (GINGER ROOT) 550 MG CAPS    Take 550 mg by mouth daily as needed (motion sickness).   GLUCOSAMINE SULFATE 500 MG CAPS    Take 500 mg by  mouth 3 (three) times daily.   KETOROLAC  (TORADOL ) 10 MG TABLET    Take 10 mg by mouth daily as needed (not to exceed 5 pills per month.).   MELATONIN 5 MG TABS    Take 15 mg by mouth at bedtime.   METRONIDAZOLE  (METROCREAM ) 0.75 % CREAM    Apply 1 Application topically 2 (two) times daily.   MULTIPLE VITAMINS TABLET    Take 1 tablet by mouth daily in the afternoon.   OMEPRAZOLE (PRILOSEC) 20 MG CAPSULE    Take 20 mg by mouth daily.    ONDANSETRON  (ZOFRAN ) 4 MG TABLET    Take 4 mg by mouth every 8 (eight) hours as needed for nausea or vomiting.   OXYCODONE  (OXY IR/ROXICODONE ) 5 MG IMMEDIATE RELEASE TABLET    Take 1 tablet (5 mg total) by mouth 2 (two) times daily as needed for severe pain (pain score 7-10). Must last 30 days.   OXYCODONE  (OXY IR/ROXICODONE ) 5 MG IMMEDIATE RELEASE TABLET    Take 1 tablet (5 mg total) by mouth 2 (two) times daily as needed for severe pain (pain score 7-10). Must last 30 days.   PRAZOSIN  (MINIPRESS ) 1 MG CAPSULE    Take 1 mg by mouth at bedtime.   PRAZOSIN  (MINIPRESS ) 2 MG CAPSULE    Take 2 mg by mouth 3 (three) times daily.   RALOXIFENE  (EVISTA ) 60 MG TABLET    Take 60 mg by mouth daily.   ROPINIROLE  (REQUIP ) 1 MG TABLET    Take 1 mg by mouth at bedtime.   SERTRALINE  (ZOLOFT ) 100 MG TABLET    Take 100 mg by mouth 2 (two) times daily.   SIMETHICONE  180 MG CAPS    Take 180 mg by mouth 2 (two) times daily.   TIZANIDINE  (ZANAFLEX ) 2 MG TABLET    Take 0.5 tablets (1 mg total) by mouth 4 (four) times daily.  Modified Medications   No medications on file  Discontinued Medications   No medications on file     Physical Exam:   Vitals:  10/01/24 0913  BP: 122/66  Pulse: 68  Temp: (!) 97.3 F (36.3 C)  SpO2: 97%  Weight: 128 lb (58.1 kg)  Height: 4' 7 (1.397 m)   Body mass index is 29.75 kg/m. Wt Readings from Last 3 Encounters:  10/01/24 128 lb (58.1 kg)  08/20/24 125 lb (56.7 kg)  08/13/24 125 lb (56.7 kg)     Physical Exam Vitals and nursing note  reviewed.  Constitutional:      General: She is not in acute distress.    Appearance: She is not toxic-appearing.     Comments: Elderly female. Ambulating with rollator  HENT:     Head: Normocephalic and atraumatic.     Nose: Nose normal.  Eyes:     General: No scleral icterus.    Comments: Left eye esotropia. Left pupil dilated to 3 mm compared to right pupil(3mm). Pt states this is chronic and due to her eye drops.  Cardiovascular:     Rate and Rhythm: Normal rate and regular rhythm.     Heart sounds: Murmur heard.  Pulmonary:     Effort: Pulmonary effort is normal.     Breath sounds: Normal breath sounds.  Abdominal:     General: Abdomen is flat. Bowel sounds are normal. There is no distension.     Palpations: Abdomen is soft.     Tenderness: There is no abdominal tenderness.  Musculoskeletal:     Comments: Wearing a lift on the outside of her shoe  Thoracic kyphosis  Skin:    General: Skin is warm and dry.     Capillary Refill: Capillary refill takes less than 2 seconds.  Neurological:     Mental Status: She is alert and oriented to person, place, and time.      Labs reviewed: Basic Metabolic Panel: Recent Labs    02/18/24 0634  NA 138  K 3.6  CL 104  CO2 27  GLUCOSE 112*  BUN 16  CREATININE 0.71  CALCIUM 9.1   Liver Function Tests: Recent Labs    02/18/24 0634  AST 21  ALT 16  ALKPHOS 68  BILITOT 0.2  PROT 6.4*  ALBUMIN 3.9   Recent Labs    02/18/24 0634  LIPASE 29    CBC: Recent Labs    02/18/24 0634  WBC 5.4  NEUTROABS 2.7  HGB 10.1*  HCT 27.9*  MCV 92.7  PLT 240   Assessment & Plan Encounter for annual general medical examination with abnormal findings in adult Routine health maintenance is ongoing with regular follow-ups and medication management. - Continue routine health maintenance and follow-up appointments. 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 The left leg is approximately one inch shorter post-fracture. Pain management is challenging due to retained hardware and previous surgeries. Recent use of a shoe lift has improved knee pain and balance, but muscle pain persists due to altered gait. - Referred to physical therapy for evaluation and management of short leg syndrome and gait training. - Continue use of shoe lift to improve leg length discrepancy and balance.     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 Due to her eye drops. Left pupil is 3 mm in diameter. Right pupil is 1 mm in diameter.     Restless leg syndrome Refill on Requip  for 12 months. No side effects. RLS controlled     Myofascial pain syndrome Follows with pain clinic. Pain clinic is prescribing buprenorphine  patch, oxycodone , neurontin , zanaflex       Other osteoarthritis involving multiple joints Refill on toradol . Pt may only use this 5 times a month. Husband keep tracks of her usage. Discussed potential for PUD with continued and chronic NSAIDs use.     Chronic hip pain, left Chronic. Prn toradol . Will provide limited refills. May use only 5 times per month.  Husband keep tracks of her usage. Discussed potential for PUD with continued and chronic NSAIDs use.      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 Pain is managed with occipital  nerve blocks and medications. Kyphosis contributes to postural issues and neck pain. Use of a figure-eight brace has been beneficial for posture but causes discomfort when worn continuously. - Continue current pain management regimen including occipital nerve blocks and medications. - Encouraged use of figure-eight brace for posture training, but advised against continuous use to prevent muscle atrophy. - Discussed potential use of TENS unit for muscle stimulation, with caution due to skin irritation from adhesive.  Pt gets her buprenorphine  patch, oxycodone , neurontin , zanaflex  from Dr. Marcelino with Pain clinic.  DO NOT REFILLS THESE Rx. These Rx come from Pain clinic.     Severe episode of recurrent major depressive disorder, without psychotic features (HCC) Follows with Dr. Raelyn with psych.  Pt also has reported PTSD from childhood trauma per pt and husband. Pt receives her refills of Prazosin  and Zoloft  from psych     Binocular vision disorder with diplopia Follows with Dr. Jaye with ophthalmology. They provide refills on the following eye drops: Atropine, Brimonidine, Dorzolamide.       Meds ordered this encounter  Medications   rOPINIRole  (REQUIP ) 1 MG tablet    Sig: Take 1 tablet (1 mg total) by mouth at bedtime.    Dispense:  90 tablet    Refill:  3   raloxifene  (EVISTA ) 60 MG tablet    Sig: Take 1 tablet (60 mg total) by mouth daily.    Dispense:  90 tablet    Refill:  3   ketorolac  (TORADOL ) 10 MG tablet    Sig: Take 1 tablet (10 mg total) by mouth daily as needed (not to exceed 5 pills per month.).    Dispense:  30 tablet    Refill:  2   ondansetron  (ZOFRAN ) 4 MG tablet    Sig: Take 1 tablet (4 mg total) by mouth every 8 (eight) hours as needed for nausea or vomiting.    Dispense:  30 tablet    Refill:  2   Orders Placed This Encounter  Procedures   CBC with Differential/Platelet    Standing Status:   Future    Expected Date:   03/31/2025    Expiration Date:    10/01/2025   Complete Metabolic Panel with eGFR    Standing Status:   Future    Expected Date:   03/31/2025    Expiration  Date:   10/01/2025   TSH    Standing Status:   Future    Expected Date:   03/31/2025    Expiration Date:   10/01/2025   Ambulatory referral to Physical Therapy    Referral Priority:   Routine    Referral Type:   Physical Medicine    Referral Reason:   Specialty Services Required    Requested Specialty:   Physical Therapy    Number of Visits Requested:   1    Next appt: 3 months with either Greig Cluster, NP or Harlene An, NP  Camellia Door, DO [facility] [phone]

## 2024-10-01 NOTE — Assessment & Plan Note (Addendum)
 Chronic. Due to her eye drops. Left pupil is 3 mm in diameter. Right pupil is 1 mm in diameter.

## 2024-10-01 NOTE — Assessment & Plan Note (Addendum)
 Acquired short left leg after hip fracture with chronic left hip and knee pain and retained hardware Chronic left hip and knee pain following a spiral fracture and surgical intervention with retained hardware. The left leg is approximately one inch shorter post-fracture. Pain management is challenging due to retained hardware and previous surgeries. Recent use of a shoe lift has improved knee pain and balance, but muscle pain persists due to altered gait. - Referred to physical therapy for evaluation and management of short leg syndrome and gait training. - Continue use of shoe lift to improve leg length discrepancy and balance.

## 2024-10-01 NOTE — Assessment & Plan Note (Addendum)
 Follows with Dr. Raelyn with psych.  Pt also has reported PTSD from childhood trauma per pt and husband. Pt receives her refills of Prazosin  and Zoloft  from psych

## 2024-10-01 NOTE — Assessment & Plan Note (Addendum)
 Follows with Dr. Jaye with ophthalmology. They provide refills on the following eye drops: Atropine, Brimonidine, Dorzolamide.

## 2024-10-01 NOTE — Assessment & Plan Note (Addendum)
 Orders:    Ambulatory referral to Physical Therapy

## 2024-10-01 NOTE — Assessment & Plan Note (Addendum)
 Follows with pain clinic. Pain clinic is prescribing buprenorphine  patch, oxycodone , neurontin , zanaflex 

## 2024-10-01 NOTE — Assessment & Plan Note (Addendum)
 Refill on toradol . Pt may only use this 5 times a month. Husband keep tracks of her usage. Discussed potential for PUD with continued and chronic NSAIDs use.

## 2024-10-01 NOTE — Assessment & Plan Note (Addendum)
 Refill on Requip  for 12 months. No side effects. RLS controlled

## 2024-10-01 NOTE — Assessment & Plan Note (Addendum)
 Chronic. Prn toradol . Will provide limited refills. May use only 5 times per month.  Husband keep tracks of her usage. Discussed potential for PUD with continued and chronic NSAIDs use.

## 2024-10-01 NOTE — Assessment & Plan Note (Addendum)
 Continue with OTC prilosec 20 mg daily

## 2024-10-05 ENCOUNTER — Encounter: Payer: Self-pay | Admitting: Orthopedic Surgery

## 2024-10-07 MED ORDER — OMEPRAZOLE 20 MG PO CPDR: 20.0000 mg | DELAYED_RELEASE_CAPSULE | Freq: Every day | ORAL | 1 refills | Status: AC

## 2024-11-03 NOTE — Progress Notes (Unsigned)
 PROVIDER NOTE: Interpretation of information contained herein should be left to medically-trained personnel. Specific patient instructions are provided elsewhere under Patient Instructions section of medical record. This document was created in part using AI and STT-dictation technology, any transcriptional errors that may result from this process are unintentional.  Patient: Sandra Mcconnell  Service: E/M   PCP: Laurence Locus, DO  DOB: Jul 01, 1941  DOS: 11/05/2024  Provider: Emmy MARLA Blanch, NP  MRN: [id number]  Delivery: Face-to-face  Specialty: Interventional Pain Management  Type: Established Patient  Setting: Ambulatory outpatient facility  Specialty designation: 09  Referring Prov.: Glover Lenis, MD  Location: Outpatient office facility       History of present illness (HPI) Sandra Mcconnell, a 83 y.o. year old female, is here today because of her No primary diagnosis found.. Sandra Mcconnell's primary complain today is No chief complaint on file.  Pertinent problems: Sandra Mcconnell has Cervical spinal cord compression (HCC); Recurrent major depression-severe (HCC); GERD (gastroesophageal reflux disease); OA (osteoarthritis); Osteoporosis, post-menopausal; Restless leg syndrome; Chronic pain syndrome; Arthritis of left knee; Lumbar radiculopathy; Cervical spondylosis; Encounter for long-term opiate analgesic use; S/P cervical spinal fusion (C2-C6); Lumbar post-laminectomy syndrome; Bilateral occipital neuralgia; Myofascial pain syndrome; Chronic hip pain, left; Cerebrovascular accident (CVA) due to occlusion of left anterior cerebral artery (HCC); and Pain medication agreement signed on their pertinent problem list.  Pain Assessment: Severity of   is reported as a  /10. Location:    / . Onset:  . Quality:  . Timing:  . Modifying factor(s):  SABRA Vitals:  vitals were not taken for this visit.  BMI: Estimated body mass index is 29.75 kg/m as calculated from the following:   Height as of 10/01/24: 4' 7 (1.397 m).    Weight as of 10/01/24: 128 lb (58.1 kg).  Last encounter: 08/20/2024. Last procedure: Visit date not found.  Reason for encounter: {Blank single:19197::medication management,post-procedure evaluation and assessment,both, medication management and post-procedure evaluation and assessment,evaluation of worsening, or previously known (established) problem,patient-requested evaluation,follow-up evaluation,evaluation for possible interventional PM therapy/treatment,evaluation of new problem, *** }.   Discussed the use of AI scribe software for clinical note transcription with the patient, who gave verbal consent to proceed.  History of Present Illness           Pharmacotherapy Assessment   Monitoring: [place] PMP: PDMP not reviewed this encounter.       Pharmacotherapy: No side-effects or adverse reactions reported. Compliance: No problems identified. Effectiveness: Clinically acceptable.  Erlene Doyal [place], [place]  08/20/2024 11:04 AM  Sign when Signing Visit Safety precautions to be maintained throughout the outpatient stay will include: orient to surroundings, keep bed in low position, maintain call bell within reach at all times, provide assistance with transfer out of bed and ambulation.     UDS:  Summary  Date Value Ref Range Status  08/03/2023 Note  Final    Comment:    ==================================================================== Compliance Drug Analysis, Ur ==================================================================== Test                             Result       Flag       Units  Drug Present and Declared for Prescription Verification   Oxymorphone                    48           EXPECTED   ng/mg creat   Noroxycodone  718          EXPECTED   ng/mg creat   Noroxymorphone                 326          EXPECTED   ng/mg creat    Oxymorphone, noroxycodone and noroxymorphone are expected    metabolites of oxycodone . Noroxymorphone is an  expected metabolite    of oxymorphone. Sources of oxycodone  and/or oxymorphone include    scheduled prescription medications.    Buprenorphine                   7            EXPECTED   ng/mg creat   Norbuprenorphine               6            EXPECTED   ng/mg creat    Source of buprenorphine  is a scheduled prescription medication.    Norbuprenorphine is an expected metabolite of buprenorphine .    Gabapentin                      PRESENT      EXPECTED   Sertraline                      PRESENT      EXPECTED   Desmethylsertraline            PRESENT      EXPECTED    Desmethylsertraline is an expected metabolite of sertraline .    Acetaminophen                   PRESENT      EXPECTED  Drug Absent but Declared for Prescription Verification   Oxycodone                       Not Detected UNEXPECTED ng/mg creat    Oxycodone  is almost always present in patients taking this drug    consistently.  Absence of oxycodone  could be due to lapse of time    since the last dose or unusual pharmacokinetics (rapid metabolism).    Tizanidine                      Not Detected UNEXPECTED    Tizanidine , as indicated in the declared medication list, is not    always detected even when used as directed.    Quetiapine                      Not Detected UNEXPECTED   Diclofenac                     Not Detected UNEXPECTED    Diclofenac, as indicated in the declared medication list, is not    always detected even when used as directed.    Salicylate                     Not Detected UNEXPECTED    Aspirin, as indicated in the declared medication list, is not always    detected even when used as directed.    Lidocaine                       Not Detected UNEXPECTED    Lidocaine , as indicated in the declared medication list, is not    always detected even  when used as directed.  ==================================================================== Test                      Result    Flag   Units      Ref Range   Creatinine               169              mg/dL      >=79 ==================================================================== Declared Medications:  The flagging and interpretation on this report are based on the  following declared medications.  Unexpected results may arise from  inaccuracies in the declared medications.   **Note: The testing scope of this panel includes these medications:   Gabapentin  (Neurontin )  Oxycodone   Quetiapine  (Seroquel )  Sertraline  (Zoloft )   **Note: The testing scope of this panel does not include small to  moderate amounts of these reported medications:   Acetaminophen  (Tylenol )  Aspirin  Buprenorphine  Patch (BuTrans )  Diclofenac  Lidocaine  (Xylocaine )  Tizanidine    **Note: The testing scope of this panel does not include the  following reported medications:   Brimonidine (Alphagan)  Calcium  Dorzolamide (Trusopt)  Eye Drops  Fluticasone   Glucosamine  Ketorolac  (Toradol )  Melatonin  Multivitamin  Omeprazole  (Prilosec)  Ondansetron  (Zofran )  Prazosin  (Minipress )  Probiotic  Raloxifene  (Evista )  Ropinirole  (Requip )  Simethicone   Topical ==================================================================== For clinical consultation, please call [phone]. ====================================================================     No results found for: CBDTHCR No results found for: D8THCCBX No results found for: D9THCCBX  ROS  Constitutional: Denies any fever or chills Gastrointestinal: No reported hemesis, hematochezia, vomiting, or acute GI distress Musculoskeletal: Left hip pain Neurological: No reported episodes of acute onset apraxia, aphasia, dysarthria, agnosia, amnesia, paralysis, loss of coordination, or loss of consciousness  Medication Review  Calcium 600+D3 Plus Minerals, Ginger Root, Glucosamine Sulfate, Multiple Vitamins, Simethicone , acetaminophen , aspirin EC, atropine, brimonidine, buprenorphine , cyclopentolate ,  dorzolamide, fluticasone , gabapentin , ketorolac , melatonin, omeprazole , ondansetron , oxyCODONE , prazosin , rOPINIRole , raloxifene , sertraline , and tiZANidine   History Review  Allergy: Ms. Sicard is allergic to aripiprazole, ciprofloxacin, mirtazapine, and penicillins. Drug: Ms. Tullo  reports no history of drug use. Alcohol:  reports no history of alcohol use. Tobacco:  reports that she has never smoked. She has never used smokeless tobacco. Social: Ms. Lieurance  reports that she has never smoked. She has never used smokeless tobacco. She reports that she does not drink alcohol and does not use drugs. Medical:  has a past medical history of Anemia (08/04/2017), Anxiety, Carotid artery disease, Carpal tunnel syndrome, Cataract cortical, senile, Cervical spinal cord compression (HCC), Chicken pox, Colitis, Complication of anesthesia, Depression, Dizziness, DJD (degenerative joint disease), Dysphagia, GERD (gastroesophageal reflux disease), Hallux rigidus, Hemorrhoids, History of hiatal hernia, History of palpitations, HLD (hyperlipidemia), Hypertension, IBS (irritable bowel syndrome), Iritis, Lactose intolerance, Migraines, Neuropathy, OCD (obsessive compulsive disorder), OSA on CPAP, Osteoarthritis, Osteoporosis, Panic attacks, PONV (postoperative nausea and vomiting), Psoriasis, PTSD (post-traumatic stress disorder), Redundant colon, REM sleep behavior disorder, Rosacea, Slow transit constipation, Spondylosis, and Stroke (HCC) (08/2000). Surgical: Ms. Bordas  has a past surgical history that includes Hand surgery; Spine surgery; Tonsillectomy; Abdominal hysterectomy; Back surgery; Foot Fusion (Bilateral); Eye surgery; TORUS PALATINUS (2007); Cataract extraction w/PHACO (Right, 12/08/2015); Colonoscopy; Esophagogastroduodenoscopy; Esophagogastroduodenoscopy (N/A, 11/15/2017); Colonoscopy with propofol  (N/A, 11/15/2017); Intramedullary (im) nail intertrochanteric (Left, 11/04/2020); and Hardware Removal (Left,  04/29/2021). Family: family history includes Breast cancer in her mother.  Laboratory Chemistry Profile   Renal Lab Results  Component Value Date   BUN 16  02/18/2024   CREATININE 0.71 02/18/2024   GFRAA >60 09/06/2015   GFRNONAA >60 02/18/2024    Hepatic Lab Results  Component Value Date   AST 21 02/18/2024   ALT 16 02/18/2024   ALBUMIN 3.9 02/18/2024   ALKPHOS 68 02/18/2024   LIPASE 29 02/18/2024    Electrolytes Lab Results  Component Value Date   NA 138 02/18/2024   K 3.6 02/18/2024   CL 104 02/18/2024   CALCIUM 9.1 02/18/2024   MG 1.9 11/12/2020    Bone No results found for: VD25OH, VD125OH2TOT, CI6874NY7, CI7874NY7, 25OHVITD1, 25OHVITD2, 25OHVITD3, TESTOFREE, TESTOSTERONE  Inflammation (CRP: Acute Phase) (ESR: Chronic Phase) No results found for: CRP, ESRSEDRATE, LATICACIDVEN       Note: Above Lab results reviewed.  Recent Imaging Review  DG HIP UNILAT W OR W/O PELVIS 2-3 VIEWS LEFT CLINICAL DATA:  Left hip pain, no known injury, initial encounter  EXAM: DG HIP (WITH OR WITHOUT PELVIS) 2-3V LEFT  COMPARISON:  04/29/2021  FINDINGS: Pelvic ring is intact. Degenerative changes of the right hip joint and lumbar spine are seen. Postsurgical changes in the proximal left femur are noted. No acute fracture or dislocation is noted. No soft tissue changes are seen.  IMPRESSION: No acute abnormality noted.  Electronically Signed   By: Oneil Devonshire M.D.   On: 08/18/2024 19:25 Note: Reviewed        Physical Exam  Vitals: BP 122/62 (BP Location: Left Arm, Patient Position: Sitting, Cuff Size: Normal)   Pulse 70   Temp (!) 97.3 F (36.3 C) (Temporal)   Resp 16   Ht 4' 7 (1.397 m)   Wt 125 lb (56.7 kg)   SpO2 98%   BMI 29.05 kg/m  BMI: Estimated body mass index is 29.05 kg/m as calculated from the following:   Height as of this encounter: 4' 7 (1.397 m).   Weight as of this encounter: 125 lb (56.7 kg). Ideal: Patient must be at  least 60 in tall to calculate ideal body weight General appearance: Well nourished, well developed, and well hydrated. In no apparent acute distress Mental status: Alert, oriented x 3 (person, place, & time)       Respiratory: No evidence of acute respiratory distress Eyes: PERLA  Musculoskeletal: + left hip pain worse with weight bearing, walking, standing and sitting Assessment   Diagnosis Status  1. Chronic pain syndrome   2. Chronic hip pain, left   3. Bilateral occipital neuralgia   4. Arthritis of left knee   5. Chronic left SI joint pain    Controlled Controlled Controlled   Updated Problems: No problems updated.  Plan of Care  Problem-specific:  Assessment and Plan    Monitoring: [place] PMP: PDMP not reviewed this encounter. {Blank single:19197::Unable to conduct review of the controlled substance reporting system due to technological failure.,     } Pharmacotherapy: {Blank single:19197::Opioid-induced constipation (OIC)(K59.03, T40.2X5A),No side-effects or adverse reactions reported.} Compliance: {Blank single:19197::Medication agreement violation - unsanctioned acquisition/use of additional opioid-containing medication,No problems identified.} Effectiveness: {Blank single:19197::Clinically acceptable.}  No notes on file  UDS:  Summary  Date Value Ref Range Status  08/03/2023 Note  Final    Comment:    ==================================================================== Compliance Drug Analysis, Ur ==================================================================== Test                             Result       Flag       Units  Drug Present  and Declared for Prescription Verification   Oxymorphone                    48           EXPECTED   ng/mg creat   Noroxycodone                   718          EXPECTED   ng/mg creat   Noroxymorphone                 326          EXPECTED   ng/mg creat    Oxymorphone, noroxycodone and noroxymorphone are expected     metabolites of oxycodone . Noroxymorphone is an expected metabolite    of oxymorphone. Sources of oxycodone  and/or oxymorphone include    scheduled prescription medications.    Buprenorphine                   7            EXPECTED   ng/mg creat   Norbuprenorphine               6            EXPECTED   ng/mg creat    Source of buprenorphine  is a scheduled prescription medication.    Norbuprenorphine is an expected metabolite of buprenorphine .    Gabapentin                      PRESENT      EXPECTED   Sertraline                      PRESENT      EXPECTED   Desmethylsertraline            PRESENT      EXPECTED    Desmethylsertraline is an expected metabolite of sertraline .    Acetaminophen                   PRESENT      EXPECTED  Drug Absent but Declared for Prescription Verification   Oxycodone                       Not Detected UNEXPECTED ng/mg creat    Oxycodone  is almost always present in patients taking this drug    consistently.  Absence of oxycodone  could be due to lapse of time    since the last dose or unusual pharmacokinetics (rapid metabolism).    Tizanidine                      Not Detected UNEXPECTED    Tizanidine , as indicated in the declared medication list, is not    always detected even when used as directed.    Quetiapine                      Not Detected UNEXPECTED   Diclofenac                     Not Detected UNEXPECTED    Diclofenac, as indicated in the declared medication list, is not    always detected even when used as directed.    Salicylate                     Not Detected UNEXPECTED    Aspirin, as indicated  in the declared medication list, is not always    detected even when used as directed.    Lidocaine                       Not Detected UNEXPECTED    Lidocaine , as indicated in the declared medication list, is not    always detected even when used as directed.  ==================================================================== Test                      Result     Flag   Units      Ref Range   Creatinine              169              mg/dL      >=79 ==================================================================== Declared Medications:  The flagging and interpretation on this report are based on the  following declared medications.  Unexpected results may arise from  inaccuracies in the declared medications.   **Note: The testing scope of this panel includes these medications:   Gabapentin  (Neurontin )  Oxycodone   Quetiapine  (Seroquel )  Sertraline  (Zoloft )   **Note: The testing scope of this panel does not include small to  moderate amounts of these reported medications:   Acetaminophen  (Tylenol )  Aspirin  Buprenorphine  Patch (BuTrans )  Diclofenac  Lidocaine  (Xylocaine )  Tizanidine    **Note: The testing scope of this panel does not include the  following reported medications:   Brimonidine (Alphagan)  Calcium  Dorzolamide (Trusopt)  Eye Drops  Fluticasone   Glucosamine  Ketorolac  (Toradol )  Melatonin  Multivitamin  Omeprazole  (Prilosec)  Ondansetron  (Zofran )  Prazosin  (Minipress )  Probiotic  Raloxifene  (Evista )  Ropinirole  (Requip )  Simethicone   Topical ==================================================================== For clinical consultation, please call [phone]. ====================================================================     No results found for: CBDTHCR No results found for: D8THCCBX No results found for: D9THCCBX  ROS  Constitutional: {Blank single:19197::Denies any fever or chills} Gastrointestinal: {Blank single:19197::No reported hemesis, hematochezia, vomiting, or acute GI distress} Musculoskeletal: {Blank single:19197::Denies any acute onset joint swelling, redness, loss of ROM, or weakness} Neurological: {Blank single:19197::No reported episodes of acute onset apraxia, aphasia, dysarthria, agnosia, amnesia, paralysis, loss of coordination, or loss of  consciousness}  Medication Review  Calcium 600+D3 Plus Minerals, Ginger Root, Glucosamine Sulfate, Multiple Vitamins, Simethicone , acetaminophen , aspirin EC, atropine, brimonidine, buprenorphine , cyclopentolate , dorzolamide, fluticasone , gabapentin , ketorolac , melatonin, metroNIDAZOLE , omeprazole , ondansetron , oxyCODONE , prazosin , rOPINIRole , raloxifene , sertraline , and tiZANidine   History Review  Allergy: Ms. Mares is allergic to aripiprazole, ciprofloxacin, mirtazapine, and penicillins. Drug: Ms. Takach  reports no history of drug use. Alcohol:  reports no history of alcohol use. Tobacco:  reports that she has never smoked. She has never used smokeless tobacco. Social: Ms. Anspach  reports that she has never smoked. She has never used smokeless tobacco. She reports that she does not drink alcohol and does not use drugs. Medical:  has a past medical history of Anemia (08/04/2017), Anxiety, Carotid artery disease, Carpal tunnel syndrome, Cataract cortical, senile, Cervical spinal cord compression (HCC), Chicken pox, Colitis, Complication of anesthesia, Depression, Dizziness, DJD (degenerative joint disease), Dysphagia, GERD (gastroesophageal reflux disease), Hallux rigidus, Hemorrhoids, Hip fracture (HCC) (11/03/2020), History of hiatal hernia, History of palpitations, HLD (hyperlipidemia), Hypertension, IBS (irritable bowel syndrome), Iritis, Lactose intolerance, Migraines, Neuropathy, OCD (obsessive compulsive disorder), OSA on CPAP, Osteoarthritis, Osteoporosis, Panic attacks, PONV (postoperative nausea and vomiting), Psoriasis, PTSD (post-traumatic stress disorder), Redundant colon, REM sleep behavior disorder, Rosacea, Slow transit constipation, Spondylosis, and Stroke (HCC) (  08/2000). Surgical: Ms. Gao  has a past surgical history that includes Hand surgery; Spine surgery; Tonsillectomy; Abdominal hysterectomy; Back surgery; Foot Fusion (Bilateral); Eye surgery; TORUS PALATINUS (2007); Cataract  extraction w/PHACO (Right, 12/08/2015); Colonoscopy; Esophagogastroduodenoscopy; Esophagogastroduodenoscopy (N/A, 11/15/2017); Colonoscopy with propofol  (N/A, 11/15/2017); Intramedullary (im) nail intertrochanteric (Left, 11/04/2020); and Hardware Removal (Left, 04/29/2021). Family: family history includes Breast cancer in her mother.  Laboratory Chemistry Profile   Renal Lab Results  Component Value Date   BUN 16 02/18/2024   CREATININE 0.71 02/18/2024   GFRAA >60 09/06/2015   GFRNONAA >60 02/18/2024    Hepatic Lab Results  Component Value Date   AST 21 02/18/2024   ALT 16 02/18/2024   ALBUMIN 3.9 02/18/2024   ALKPHOS 68 02/18/2024   LIPASE 29 02/18/2024    Electrolytes Lab Results  Component Value Date   NA 138 02/18/2024   K 3.6 02/18/2024   CL 104 02/18/2024   CALCIUM 9.1 02/18/2024   MG 1.9 11/12/2020    Bone No results found for: VD25OH, VD125OH2TOT, CI6874NY7, CI7874NY7, 25OHVITD1, 25OHVITD2, 25OHVITD3, TESTOFREE, TESTOSTERONE  Inflammation (CRP: Acute Phase) (ESR: Chronic Phase) No results found for: CRP, ESRSEDRATE, LATICACIDVEN       Note: {Blank single:19197::Ms. Tygart indicates labs done and monitored by primary care practitioner using a non-[facility] EMR system,No results found under the [facility] electronic medical record,Patient noncompliant with Lab Work orders.,Results made available to patient.,Lab results reviewed and made available to patient.,Lab results reviewed and explained to patient in Layman's terms.,Above Lab results reviewed.}  Recent Imaging Review  DG HIP UNILAT W OR W/O PELVIS 2-3 VIEWS LEFT CLINICAL DATA:  Left hip pain, no known injury, initial encounter  EXAM: DG HIP (WITH OR WITHOUT PELVIS) 2-3V LEFT  COMPARISON:  04/29/2021  FINDINGS: Pelvic ring is intact. Degenerative changes of the right hip joint and lumbar spine are seen. Postsurgical changes in the proximal left femur are noted. No acute  fracture or dislocation is noted. No soft tissue changes are seen.  IMPRESSION: No acute abnormality noted.  Electronically Signed   By: Oneil Devonshire M.D.   On: 08/18/2024 19:25 Note: {Blank single:19197::No new results found.,No results found under the [facility] electronic medical record.,Imaging results reviewed and explained to patient in Layman's terms.,Results of ordered imaging test(s) reviewed and explained to patient in Layman's terms.,Imaging results reviewed.,Reviewed} {Blank single:19197::Results visible under [facility].,Results visible under [facility].,Results visible under [facility].,Results visible under [facility].,Results visible under Care Everywhere.,Results made available to patient.,Copy of results provided to patient.,Patient noncompliant with diagnostic imaging orders.,     }  Physical Exam  Vitals: There were no vitals taken for this visit. BMI: Estimated body mass index is 29.75 kg/m as calculated from the following:   Height as of 10/01/24: 4' 7 (1.397 m).   Weight as of 10/01/24: 128 lb (58.1 kg). Ideal: Patient weight not recorded General appearance: {general exam:210120802::Well nourished, well developed, and well hydrated. In no apparent acute distress} Mental status: {Blank single:19197::Alert and oriented x 3. Exaggerated physical and/or psychosocial pain behavior perceived.,Alert, oriented x 3 (person, place, & time)} {Blank single:19197::Ms. Ibach's speech pattern and demeanor seems to suggest oversedation,     } Respiratory: {Blank single:19197::Oxygen-dependent COPD,No evidence of acute respiratory distress} Eyes: {Blank single:19197::Miotic (pupilary constriction) due to opiate use,Midriatic,Anisocoric,Evidence of ptosis,Pin-point pupils,PERLA}   Assessment   Diagnosis Status  No diagnosis found. {Blank single:19197::Absent,Deteriorating,Having a Flare-up,Improved,Improving,Not  improving,Not responding,Persistent,Present,Recurring,Reoccurring,Resolved,Responding,Stable,Unchanged,improved,Worsened,Worsening,Controlled} {Blank single:19197::Absent,Deteriorating,Having a Flare-up,Improved,Improving,Not improving,Not responding,Persistent,Present,Recurring,Reoccurring,Resolved,Responding,Stable,Unchanged,improved,Worsened,Worsening,Controlled} {Blank single:19197::Absent,Deteriorating,Having a Flare-up,Improved,Improving,Not improving,Not responding,Persistent,Present,Recurring,Reoccurring,Resolved,Responding,Stable,Unchanged,improved,Worsened,Worsening,Controlled}   Updated  Problems: Problem  Pain Medication Agreement Signed   [facility]MENT AGREEMENT; Read, reviewed and signed. Patient voice understanding. Copy given to patient. Original to Medical Records. 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 ANAYS DETORE has a current medication list which includes the following long-term medication(s): calcium 600+d3 plus minerals, gabapentin , omeprazole , prazosin , prazosin , ropinirole , sertraline , and simethicone .  Pharmacotherapy (Medications Ordered): No orders of the defined types were placed in this encounter.  Orders:  No orders of the defined types were placed in this encounter.    {There is no content from the last Plan section.}    No follow-ups on file.    Recent Visits Date Type Provider Dept  08/20/24 Office Visit Ervie Mccard K, NP [facility]  08/13/24 Office Visit Jayani Rozman K, NP [facility]  Showing recent visits within past 90 days and meeting all other requirements Future Appointments Date Type Provider Dept  11/05/24 Appointment Azyah Flett K, NP [facility]  Showing future appointments within next 90 days and meeting all other requirements  I discussed the assessment and treatment plan with the patient. The patient was provided an opportunity to ask questions and all were answered. The patient agreed with the plan and demonstrated an understanding of the instructions.  Patient advised to call back or seek an in-person evaluation if the symptoms or condition worsens.  Duration of encounter: *** minutes.  Total time on encounter, as per AMA guidelines included both the face-to-face and non-face-to-face time personally spent by the physician and/or other qualified health care professional(s) on the day of the encounter (includes time in activities that require the physician or other qualified health care professional and does not include time in activities normally performed by clinical staff). Physician's time may include the following activities when performed: Preparing to see the patient (e.g., pre-charting review of records, searching for previously ordered imaging, lab work, and nerve conduction tests) Review of prior analgesic pharmacotherapies. Reviewing PMP Interpreting ordered tests (e.g., lab work, imaging, nerve conduction tests) Performing post-procedure evaluations, including interpretation of diagnostic procedures Obtaining and/or reviewing separately obtained history Performing a medically appropriate examination and/or evaluation Counseling and educating the patient/family/caregiver Ordering medications, tests, or procedures Referring and communicating with  other health care professionals (when not separately reported) Documenting clinical information in the electronic or other health record Independently interpreting results (not separately reported) and communicating results to the patient/ family/caregiver Care coordination (not separately reported)  Note by: Saraya Tirey K Isauro Skelley, NP (TTS and AI technology used. I apologize for any typographical errors that were not detected and corrected.) Date: 11/05/2024; Time: 1:31 PM

## 2024-11-05 ENCOUNTER — Encounter: Payer: Self-pay | Admitting: Nurse Practitioner

## 2024-11-05 ENCOUNTER — Ambulatory Visit: Attending: Nurse Practitioner | Admitting: Nurse Practitioner

## 2024-11-05 VITALS — BP 121/65 | HR 81 | Temp 97.5°F | Resp 18 | Ht <= 58 in | Wt 128.0 lb

## 2024-11-05 DIAGNOSIS — M1712 Unilateral primary osteoarthritis, left knee: Secondary | ICD-10-CM | POA: Insufficient documentation

## 2024-11-05 DIAGNOSIS — Z79899 Other long term (current) drug therapy: Secondary | ICD-10-CM | POA: Diagnosis not present

## 2024-11-05 DIAGNOSIS — M533 Sacrococcygeal disorders, not elsewhere classified: Secondary | ICD-10-CM | POA: Diagnosis not present

## 2024-11-05 DIAGNOSIS — G894 Chronic pain syndrome: Secondary | ICD-10-CM | POA: Insufficient documentation

## 2024-11-05 DIAGNOSIS — G8929 Other chronic pain: Secondary | ICD-10-CM | POA: Diagnosis present

## 2024-11-05 DIAGNOSIS — M5481 Occipital neuralgia: Secondary | ICD-10-CM | POA: Insufficient documentation

## 2024-11-05 DIAGNOSIS — M25552 Pain in left hip: Secondary | ICD-10-CM | POA: Insufficient documentation

## 2024-11-05 MED ORDER — OXYCODONE HCL 5 MG PO TABS: 5.0000 mg | ORAL_TABLET | Freq: Three times a day (TID) | ORAL | 0 refills | Status: AC | PRN

## 2024-11-05 MED ORDER — GABAPENTIN 800 MG PO TABS: 800.0000 mg | ORAL_TABLET | Freq: Three times a day (TID) | ORAL | 5 refills | Status: AC

## 2024-11-05 MED ORDER — BUPRENORPHINE 15 MCG/HR TD PTWK: 1.0000 | MEDICATED_PATCH | TRANSDERMAL | 2 refills | Status: AC

## 2024-11-05 NOTE — Patient Instructions (Signed)

## 2024-11-05 NOTE — Progress Notes (Signed)
 Nursing Pain Medication Assessment:  Safety precautions to be maintained throughout the outpatient stay will include: orient to surroundings, keep bed in low position, maintain call bell within reach at all times, provide assistance with transfer out of bed and ambulation.  Medication Inspection Compliance: Pill count conducted under aseptic conditions, in front of the patient. Neither the pills nor the bottle was removed from the patient's sight at any time. Once count was completed pills were immediately returned to the patient in their original bottle.  Medication: Buprenorphine  patch (Butrans ) Pill/Patch Count: 0 of 4 pills/patches remain Pill/Patch Appearance: Markings consistent with prescribed medication Bottle Appearance: Standard pharmacy container. Clearly labeled. Filled Date: 93 / 02 / 2025 Last Medication intake:  11/04/24

## 2024-11-05 NOTE — Progress Notes (Signed)
 Nursing Pain Medication Assessment:  Safety precautions to be maintained throughout the outpatient stay will include: orient to surroundings, keep bed in low position, maintain call bell within reach at all times, provide assistance with transfer out of bed and ambulation.  Medication Inspection Compliance: Pill count conducted under aseptic conditions, in front of the patient. Neither the pills nor the bottle was removed from the patient's sight at any time. Once count was completed pills were immediately returned to the patient in their original bottle.  Medication: Oxycodone  IR Pill/Patch Count: 34 of 60 pills/patches remain Pill/Patch Appearance: Markings consistent with prescribed medication Bottle Appearance: Standard pharmacy container. Clearly labeled. Filled Date: 53 / 64 / 2025 Last Medication intake:  Yesterday

## 2024-11-05 NOTE — Addendum Note (Signed)
 Addended by: LAURENCE, Mariadel Mruk on: 11/05/2024 09:52 AM   Modules accepted: Level of Service

## 2024-11-14 ENCOUNTER — Ambulatory Visit: Admitting: Student in an Organized Health Care Education/Training Program

## 2025-01-08 ENCOUNTER — Encounter: Admitting: Orthopedic Surgery

## 2025-01-28 ENCOUNTER — Encounter: Admitting: Nurse Practitioner

## 2025-07-22 ENCOUNTER — Encounter: Admitting: Dermatology
